# Patient Record
Sex: Female | Born: 1985 | Race: Black or African American | Hispanic: No | Marital: Single | Smoking: Former smoker
Health system: Southern US, Community
[De-identification: ages and names within clinical notes are randomized; demographics above are authoritative.]

## PROBLEM LIST (undated history)

## (undated) ENCOUNTER — Emergency Department (HOSPITAL_COMMUNITY): Admission: EM | Payer: Self-pay | Source: Home / Self Care

## (undated) DIAGNOSIS — R51 Headache: Secondary | ICD-10-CM

## (undated) DIAGNOSIS — I259 Chronic ischemic heart disease, unspecified: Secondary | ICD-10-CM

## (undated) DIAGNOSIS — I10 Essential (primary) hypertension: Secondary | ICD-10-CM

## (undated) DIAGNOSIS — M5126 Other intervertebral disc displacement, lumbar region: Secondary | ICD-10-CM

## (undated) DIAGNOSIS — M5430 Sciatica, unspecified side: Secondary | ICD-10-CM

## (undated) HISTORY — PX: NO PAST SURGERIES: SHX2092

---

## 2009-03-11 ENCOUNTER — Emergency Department (HOSPITAL_COMMUNITY): Admission: EM | Admit: 2009-03-11 | Discharge: 2009-03-11 | Payer: Self-pay | Admitting: Emergency Medicine

## 2009-04-22 ENCOUNTER — Inpatient Hospital Stay (HOSPITAL_COMMUNITY): Admission: AD | Admit: 2009-04-22 | Discharge: 2009-04-22 | Payer: Self-pay | Admitting: Obstetrics & Gynecology

## 2009-05-09 ENCOUNTER — Ambulatory Visit: Payer: Self-pay | Admitting: Obstetrics and Gynecology

## 2009-05-09 LAB — CONVERTED CEMR LAB
AST: 11 units/L (ref 0–37)
Alkaline Phosphatase: 51 units/L (ref 39–117)
BUN: 13 mg/dL (ref 6–23)
Calcium: 8.5 mg/dL (ref 8.4–10.5)
Chloride: 103 meq/L (ref 96–112)
Creatinine, Ser: 0.82 mg/dL (ref 0.40–1.20)
Total Bilirubin: 0.2 mg/dL — ABNORMAL LOW (ref 0.3–1.2)

## 2009-05-14 ENCOUNTER — Ambulatory Visit (HOSPITAL_COMMUNITY): Admission: RE | Admit: 2009-05-14 | Discharge: 2009-05-14 | Payer: Self-pay | Admitting: Obstetrics and Gynecology

## 2009-05-15 ENCOUNTER — Ambulatory Visit: Payer: Self-pay | Admitting: Obstetrics and Gynecology

## 2009-06-22 ENCOUNTER — Emergency Department (HOSPITAL_COMMUNITY): Admission: EM | Admit: 2009-06-22 | Discharge: 2009-06-23 | Payer: Self-pay | Admitting: Emergency Medicine

## 2009-10-29 ENCOUNTER — Emergency Department (HOSPITAL_COMMUNITY): Admission: EM | Admit: 2009-10-29 | Discharge: 2009-10-29 | Payer: Self-pay | Admitting: Emergency Medicine

## 2009-11-12 ENCOUNTER — Inpatient Hospital Stay (HOSPITAL_COMMUNITY): Admission: AD | Admit: 2009-11-12 | Discharge: 2009-11-12 | Payer: Self-pay | Admitting: Obstetrics & Gynecology

## 2010-03-04 ENCOUNTER — Emergency Department (HOSPITAL_COMMUNITY)
Admission: EM | Admit: 2010-03-04 | Discharge: 2010-03-04 | Payer: Self-pay | Source: Home / Self Care | Admitting: Emergency Medicine

## 2010-04-17 LAB — URINALYSIS, ROUTINE W REFLEX MICROSCOPIC
Bilirubin Urine: NEGATIVE
Glucose, UA: NEGATIVE mg/dL
Glucose, UA: NEGATIVE mg/dL
Hgb urine dipstick: NEGATIVE
Ketones, ur: NEGATIVE mg/dL
Nitrite: NEGATIVE
Protein, ur: NEGATIVE mg/dL
Protein, ur: NEGATIVE mg/dL
Specific Gravity, Urine: 1.02 (ref 1.005–1.030)
Urobilinogen, UA: 1 mg/dL (ref 0.0–1.0)
pH: 7.5 (ref 5.0–8.0)

## 2010-04-17 LAB — GC/CHLAMYDIA PROBE AMP, GENITAL
Chlamydia, DNA Probe: NEGATIVE
Chlamydia, DNA Probe: NEGATIVE
GC Probe Amp, Genital: NEGATIVE
GC Probe Amp, Genital: NEGATIVE

## 2010-04-17 LAB — POCT I-STAT, CHEM 8
BUN: 10 mg/dL (ref 6–23)
Calcium, Ion: 1.11 mmol/L — ABNORMAL LOW (ref 1.12–1.32)
Glucose, Bld: 93 mg/dL (ref 70–99)
HCT: 41 % (ref 36.0–46.0)
TCO2: 26 mmol/L (ref 0–100)

## 2010-04-17 LAB — POCT PREGNANCY, URINE
Preg Test, Ur: NEGATIVE
Preg Test, Ur: NEGATIVE

## 2010-04-17 LAB — DIFFERENTIAL
Basophils Absolute: 0.1 10*3/uL (ref 0.0–0.1)
Basophils Relative: 2 % — ABNORMAL HIGH (ref 0–1)
Monocytes Relative: 5 % (ref 3–12)
Neutro Abs: 3.7 10*3/uL (ref 1.7–7.7)
Neutrophils Relative %: 52 % (ref 43–77)

## 2010-04-17 LAB — WET PREP, GENITAL
Trich, Wet Prep: NONE SEEN
WBC, Wet Prep HPF POC: NONE SEEN
Yeast Wet Prep HPF POC: NONE SEEN
Yeast Wet Prep HPF POC: NONE SEEN

## 2010-04-17 LAB — CBC
Hemoglobin: 12.6 g/dL (ref 12.0–15.0)
MCHC: 33.2 g/dL (ref 30.0–36.0)
RDW: 17.2 % — ABNORMAL HIGH (ref 11.5–15.5)

## 2010-04-21 LAB — GLUCOSE, CAPILLARY: Glucose-Capillary: 143 mg/dL — ABNORMAL HIGH (ref 70–99)

## 2010-04-24 LAB — POCT I-STAT, CHEM 8
BUN: 7 mg/dL (ref 6–23)
Calcium, Ion: 1.12 mmol/L (ref 1.12–1.32)
Chloride: 103 mEq/L (ref 96–112)
Creatinine, Ser: 0.7 mg/dL (ref 0.4–1.2)
Glucose, Bld: 90 mg/dL (ref 70–99)

## 2010-04-24 LAB — URINALYSIS, ROUTINE W REFLEX MICROSCOPIC
Bilirubin Urine: NEGATIVE
Hgb urine dipstick: NEGATIVE
Ketones, ur: NEGATIVE mg/dL
Nitrite: NEGATIVE
pH: 7.5 (ref 5.0–8.0)

## 2010-04-24 LAB — CBC
Hemoglobin: 12.7 g/dL (ref 12.0–15.0)
MCHC: 32.7 g/dL (ref 30.0–36.0)
MCV: 77.1 fL — ABNORMAL LOW (ref 78.0–100.0)
RDW: 18 % — ABNORMAL HIGH (ref 11.5–15.5)

## 2010-04-24 LAB — DIFFERENTIAL
Basophils Absolute: 0.1 10*3/uL (ref 0.0–0.1)
Basophils Relative: 1 % (ref 0–1)
Eosinophils Absolute: 0.2 10*3/uL (ref 0.0–0.7)
Eosinophils Relative: 2 % (ref 0–5)
Monocytes Absolute: 0.8 10*3/uL (ref 0.1–1.0)
Monocytes Relative: 8 % (ref 3–12)
Neutro Abs: 4.5 10*3/uL (ref 1.7–7.7)

## 2010-04-24 LAB — POCT CARDIAC MARKERS: Myoglobin, poc: 47.7 ng/mL (ref 12–200)

## 2010-04-28 LAB — CBC
HCT: 37.4 % (ref 36.0–46.0)
Hemoglobin: 11.8 g/dL — ABNORMAL LOW (ref 12.0–15.0)
MCHC: 31.6 g/dL (ref 30.0–36.0)
RBC: 4.8 MIL/uL (ref 3.87–5.11)

## 2010-04-28 LAB — WET PREP, GENITAL

## 2010-04-28 LAB — URINALYSIS, ROUTINE W REFLEX MICROSCOPIC
Bilirubin Urine: NEGATIVE
Glucose, UA: NEGATIVE mg/dL
Hgb urine dipstick: NEGATIVE
Nitrite: NEGATIVE
Specific Gravity, Urine: 1.025 (ref 1.005–1.030)
pH: 6 (ref 5.0–8.0)

## 2010-04-28 LAB — DIFFERENTIAL
Basophils Relative: 0 % (ref 0–1)
Monocytes Absolute: 0.6 10*3/uL (ref 0.1–1.0)
Monocytes Relative: 7 % (ref 3–12)
Neutro Abs: 5.3 10*3/uL (ref 1.7–7.7)

## 2010-04-28 LAB — GC/CHLAMYDIA PROBE AMP, GENITAL: Chlamydia, DNA Probe: NEGATIVE

## 2010-08-07 ENCOUNTER — Inpatient Hospital Stay (HOSPITAL_COMMUNITY)
Admission: AD | Admit: 2010-08-07 | Discharge: 2010-08-07 | Disposition: A | Discharge status: Home / Self Care | Payer: Self-pay | Source: Ambulatory Visit | Attending: Obstetrics and Gynecology | Admitting: Obstetrics and Gynecology

## 2010-08-07 ENCOUNTER — Inpatient Hospital Stay (HOSPITAL_COMMUNITY): Payer: Self-pay

## 2010-08-07 DIAGNOSIS — B9689 Other specified bacterial agents as the cause of diseases classified elsewhere: Secondary | ICD-10-CM | POA: Insufficient documentation

## 2010-08-07 DIAGNOSIS — N760 Acute vaginitis: Secondary | ICD-10-CM | POA: Insufficient documentation

## 2010-08-07 DIAGNOSIS — A499 Bacterial infection, unspecified: Secondary | ICD-10-CM | POA: Insufficient documentation

## 2010-08-07 DIAGNOSIS — R109 Unspecified abdominal pain: Secondary | ICD-10-CM | POA: Insufficient documentation

## 2010-08-07 LAB — URINALYSIS, ROUTINE W REFLEX MICROSCOPIC
Glucose, UA: NEGATIVE mg/dL
Ketones, ur: 15 mg/dL — AB
Leukocytes, UA: NEGATIVE
Nitrite: NEGATIVE
Protein, ur: NEGATIVE mg/dL
Specific Gravity, Urine: >1.03 — ABNORMAL HIGH (ref 1.005–1.030)
Urobilinogen, UA: 0.2 mg/dL (ref 0.0–1.0)
pH: 6 (ref 5.0–8.0)

## 2010-08-07 LAB — COMPREHENSIVE METABOLIC PANEL
ALT: 14 U/L (ref 0–35)
Albumin: 3.1 g/dL — ABNORMAL LOW (ref 3.5–5.2)
Alkaline Phosphatase: 61 U/L (ref 39–117)
Potassium: 3.8 mEq/L (ref 3.5–5.1)
Sodium: 138 mEq/L (ref 135–145)
Total Protein: 7.3 g/dL (ref 6.0–8.3)

## 2010-08-07 LAB — URINE MICROSCOPIC-ADD ON

## 2010-08-07 LAB — CBC
HCT: 37.6 % (ref 36.0–46.0)
Platelets: 408 10*3/uL — ABNORMAL HIGH (ref 150–400)
RDW: 17.8 % — ABNORMAL HIGH (ref 11.5–15.5)
WBC: 9 10*3/uL (ref 4.0–10.5)

## 2010-08-07 LAB — WET PREP, GENITAL

## 2010-08-08 ENCOUNTER — Emergency Department (HOSPITAL_COMMUNITY)
Admission: EM | Admit: 2010-08-08 | Discharge: 2010-08-08 | Disposition: A | Discharge status: Home / Self Care | Payer: Self-pay | Attending: Emergency Medicine | Admitting: Emergency Medicine

## 2010-08-08 ENCOUNTER — Emergency Department (HOSPITAL_COMMUNITY): Payer: Self-pay

## 2010-08-08 DIAGNOSIS — R112 Nausea with vomiting, unspecified: Secondary | ICD-10-CM | POA: Insufficient documentation

## 2010-08-08 DIAGNOSIS — E869 Volume depletion, unspecified: Secondary | ICD-10-CM | POA: Insufficient documentation

## 2010-08-08 DIAGNOSIS — R079 Chest pain, unspecified: Secondary | ICD-10-CM | POA: Insufficient documentation

## 2010-08-08 DIAGNOSIS — R55 Syncope and collapse: Secondary | ICD-10-CM | POA: Insufficient documentation

## 2010-08-08 DIAGNOSIS — I10 Essential (primary) hypertension: Secondary | ICD-10-CM | POA: Insufficient documentation

## 2010-08-08 DIAGNOSIS — R10816 Epigastric abdominal tenderness: Secondary | ICD-10-CM | POA: Insufficient documentation

## 2010-08-08 LAB — COMPREHENSIVE METABOLIC PANEL WITH GFR
ALT: 15 U/L (ref 0–35)
AST: 14 U/L (ref 0–37)
Albumin: 3.4 g/dL — ABNORMAL LOW (ref 3.5–5.2)
Alkaline Phosphatase: 58 U/L (ref 39–117)
BUN: 12 mg/dL (ref 6–23)
CO2: 27 meq/L (ref 19–32)
Calcium: 8.8 mg/dL (ref 8.4–10.5)
Chloride: 102 meq/L (ref 96–112)
Creatinine, Ser: 0.7 mg/dL (ref 0.50–1.10)
GFR calc Af Amer: >60 mL/min
GFR calc non Af Amer: >60 mL/min
Glucose, Bld: 87 mg/dL (ref 70–99)
Potassium: 4.1 meq/L (ref 3.5–5.1)
Sodium: 136 meq/L (ref 135–145)
Total Bilirubin: 0.1 mg/dL — ABNORMAL LOW (ref 0.3–1.2)
Total Protein: 7.7 g/dL (ref 6.0–8.3)

## 2010-08-08 LAB — CBC
HCT: 37.6 % (ref 36.0–46.0)
Hemoglobin: 12.2 g/dL (ref 12.0–15.0)
MCH: 24.5 pg — ABNORMAL LOW (ref 26.0–34.0)
MCHC: 32.4 g/dL (ref 30.0–36.0)
MCV: 75.5 fL — ABNORMAL LOW (ref 78.0–100.0)
Platelets: 445 10*3/uL — ABNORMAL HIGH (ref 150–400)
RBC: 4.98 MIL/uL (ref 3.87–5.11)
RDW: 17.4 % — ABNORMAL HIGH (ref 11.5–15.5)
WBC: 8.7 10*3/uL (ref 4.0–10.5)

## 2010-08-08 LAB — URINALYSIS, ROUTINE W REFLEX MICROSCOPIC
Bilirubin Urine: NEGATIVE
Glucose, UA: NEGATIVE mg/dL
pH: 6.5 (ref 5.0–8.0)

## 2010-08-08 LAB — URINE MICROSCOPIC-ADD ON

## 2010-08-08 LAB — DIFFERENTIAL
Basophils Absolute: 0 10*3/uL (ref 0.0–0.1)
Basophils Relative: 0 % (ref 0–1)
Eosinophils Absolute: 0.3 10*3/uL (ref 0.0–0.7)
Eosinophils Relative: 4 % (ref 0–5)
Lymphocytes Relative: 45 % (ref 12–46)
Lymphs Abs: 3.9 10*3/uL (ref 0.7–4.0)
Monocytes Absolute: 0.6 10*3/uL (ref 0.1–1.0)
Monocytes Relative: 7 % (ref 3–12)
Neutro Abs: 3.8 10*3/uL (ref 1.7–7.7)
Neutrophils Relative %: 44 % (ref 43–77)

## 2010-10-22 ENCOUNTER — Encounter: Payer: Self-pay | Admitting: Obstetrics and Gynecology

## 2010-12-10 ENCOUNTER — Emergency Department (HOSPITAL_COMMUNITY)
Admission: EM | Admit: 2010-12-10 | Discharge: 2010-12-10 | Disposition: A | Discharge status: Home / Self Care | Payer: Medicaid Other | Attending: Emergency Medicine | Admitting: Emergency Medicine

## 2010-12-10 DIAGNOSIS — R059 Cough, unspecified: Secondary | ICD-10-CM | POA: Insufficient documentation

## 2010-12-10 DIAGNOSIS — R05 Cough: Secondary | ICD-10-CM | POA: Insufficient documentation

## 2010-12-10 DIAGNOSIS — R6883 Chills (without fever): Secondary | ICD-10-CM | POA: Insufficient documentation

## 2010-12-10 DIAGNOSIS — J3489 Other specified disorders of nose and nasal sinuses: Secondary | ICD-10-CM | POA: Insufficient documentation

## 2010-12-10 DIAGNOSIS — J069 Acute upper respiratory infection, unspecified: Secondary | ICD-10-CM | POA: Insufficient documentation

## 2010-12-10 DIAGNOSIS — J4489 Other specified chronic obstructive pulmonary disease: Secondary | ICD-10-CM | POA: Insufficient documentation

## 2010-12-10 DIAGNOSIS — F17200 Nicotine dependence, unspecified, uncomplicated: Secondary | ICD-10-CM | POA: Insufficient documentation

## 2010-12-10 DIAGNOSIS — J449 Chronic obstructive pulmonary disease, unspecified: Secondary | ICD-10-CM | POA: Insufficient documentation

## 2010-12-10 MED ORDER — ALBUTEROL SULFATE HFA 108 (90 BASE) MCG/ACT IN AERS
2.0000 | INHALATION_SPRAY | RESPIRATORY_TRACT | Status: DC | PRN
  Administered 2010-12-10: 2 via RESPIRATORY_TRACT
  Filled 2010-12-10: qty 6.7

## 2010-12-10 NOTE — ED Provider Notes (Signed)
History     CSN: [id number] Arrival date & time: 12/10/2010 11:06 AM   First MD Initiated Contact with Patient 12/10/10 1152      Chief Complaint  Patient presents with  . Cough  . URI    (Consider location/radiation/quality/duration/timing/severity/associated sxs/prior treatment) Patient is a 25 y.o. female presenting with cough and URI. The history is provided by the patient. No language interpreter was used.  Cough This is a recurrent problem. The current episode started more than 1 week ago. The problem occurs constantly. The problem has been gradually worsening. The cough is non-productive. There has been no fever. Associated symptoms include chills, rhinorrhea and wheezing. Pertinent negatives include no chest pain, no ear congestion, no ear pain, no headaches, no sore throat and no shortness of breath. She has tried decongestants for the symptoms. The treatment provided mild relief. She is a smoker. Her past medical history is significant for bronchitis and COPD. Her past medical history does not include pneumonia, emphysema or asthma.  URI The primary symptoms include cough and wheezing. Primary symptoms do not include headaches, ear pain or sore throat.  The patient's medical history is significant for COPD. The patient's medical history does not include asthma.  Symptoms associated with the illness include chills and rhinorrhea.    History reviewed. No pertinent past medical history.  History reviewed. No pertinent past surgical history.  History reviewed. No pertinent family history.  History  Substance Use Topics  . Smoking status: Current Everyday Smoker -- 0.5 packs/day    Types: Cigarettes  . Smokeless tobacco: Not on file  . Alcohol Use: No    OB History    Grav Para Term Preterm Abortions TAB SAB Ect Mult Living                  Review of Systems  Constitutional: Positive for chills.  HENT: Positive for rhinorrhea. Negative for ear pain and sore throat.    Respiratory: Positive for cough and wheezing. Negative for shortness of breath.   Cardiovascular: Negative for chest pain.  Neurological: Negative for headaches.  All other systems reviewed and are negative.    Allergies  Review of patient's allergies indicates no known allergies.  Home Medications   Current Outpatient Rx  Name Route Sig Dispense Refill  . IBUPROFEN 200 MG PO TABS Oral Take 800 mg by mouth 2 (two) times daily as needed. Pain      . NYQUIL PO Oral Take 10 mLs by mouth 3 (three) times daily as needed. For cold symptoms       BP 142/74  Pulse 97  Temp(Src) 98.8 F (37.1 C) (Oral)  Resp 16  SpO2 97%  LMP 11/03/2010  Physical Exam  Constitutional: She is oriented to person, place, and time. She appears well-developed and well-nourished.  Eyes: Pupils are equal, round, and reactive to light.  Cardiovascular: Normal rate.   Pulmonary/Chest: No respiratory distress. She has no wheezes. She has no rales. She exhibits no tenderness.  Abdominal: Soft.  Neurological: She is alert and oriented to person, place, and time.  Skin: Skin is warm and dry.  Psychiatric: She has a normal mood and affect.    ED Course  Procedures (including critical care time)   Labs Reviewed  POCT PREGNANCY, URINE  LAB REPORT - SCANNED   No results found.   1. Upper respiratory infection       MDM    Upper respiratory symptoms times several weeks including runny nose sneezing and  cough. Has been taking ibuprofen and Vicryl was relief. Denies fever shortness of breath or wheezing. No acute distress will treat with Z-Pak and inhaler with followup next week if not better.  Medical screening examination/treatment/procedure(s) were performed by non-physician practitioner and as supervising physician I was immediately available for consultation/collaboration. Osvaldo Human, M.D.    Jethro Bastos, NP 12/11/10 4098  Carleene Cooper III, MD 12/11/10 [phone]

## 2010-12-10 NOTE — ED Notes (Signed)
Pt here for cold symptoms for several weeks, no relief with meds at home. sts now headache and dizzy.

## 2010-12-10 NOTE — ED Notes (Signed)
Pt on phone and asked RN to hold on a minute.

## 2011-01-29 ENCOUNTER — Encounter (HOSPITAL_COMMUNITY): Payer: Self-pay | Admitting: *Deleted

## 2011-01-29 ENCOUNTER — Emergency Department (HOSPITAL_COMMUNITY): Payer: Medicaid Other

## 2011-01-29 ENCOUNTER — Emergency Department (HOSPITAL_COMMUNITY)
Admission: EM | Admit: 2011-01-29 | Discharge: 2011-01-29 | Disposition: A | Discharge status: Home / Self Care | Payer: Medicaid Other | Attending: Emergency Medicine | Admitting: Emergency Medicine

## 2011-01-29 DIAGNOSIS — R21 Rash and other nonspecific skin eruption: Secondary | ICD-10-CM | POA: Insufficient documentation

## 2011-01-29 DIAGNOSIS — K2970 Gastritis, unspecified, without bleeding: Secondary | ICD-10-CM | POA: Insufficient documentation

## 2011-01-29 DIAGNOSIS — F17200 Nicotine dependence, unspecified, uncomplicated: Secondary | ICD-10-CM | POA: Insufficient documentation

## 2011-01-29 DIAGNOSIS — K219 Gastro-esophageal reflux disease without esophagitis: Secondary | ICD-10-CM

## 2011-01-29 DIAGNOSIS — K2990 Gastroduodenitis, unspecified, without bleeding: Secondary | ICD-10-CM | POA: Insufficient documentation

## 2011-01-29 DIAGNOSIS — R079 Chest pain, unspecified: Secondary | ICD-10-CM | POA: Insufficient documentation

## 2011-01-29 DIAGNOSIS — R0602 Shortness of breath: Secondary | ICD-10-CM | POA: Insufficient documentation

## 2011-01-29 LAB — URINALYSIS, ROUTINE W REFLEX MICROSCOPIC
Bilirubin Urine: NEGATIVE
Glucose, UA: NEGATIVE mg/dL
Hgb urine dipstick: NEGATIVE
Ketones, ur: NEGATIVE mg/dL
Nitrite: NEGATIVE
Protein, ur: NEGATIVE mg/dL
Specific Gravity, Urine: 1.024 (ref 1.005–1.030)
Urobilinogen, UA: 0.2 mg/dL (ref 0.0–1.0)
pH: 5.5 (ref 5.0–8.0)

## 2011-01-29 LAB — COMPREHENSIVE METABOLIC PANEL
BUN: 8 mg/dL (ref 6–23)
Calcium: 9.1 mg/dL (ref 8.4–10.5)
GFR calc Af Amer: >90 mL/min (ref >90)
Glucose, Bld: 92 mg/dL (ref 70–99)
Sodium: 135 mEq/L (ref 135–145)
Total Protein: 7.7 g/dL (ref 6.0–8.3)

## 2011-01-29 LAB — CBC
MCH: 24.1 pg — ABNORMAL LOW (ref 26.0–34.0)
MCV: 74.5 fL — ABNORMAL LOW (ref 78.0–100.0)
Platelets: 434 10*3/uL — ABNORMAL HIGH (ref 150–400)
RBC: 4.98 MIL/uL (ref 3.87–5.11)

## 2011-01-29 LAB — DIFFERENTIAL
Eosinophils Absolute: 0.2 10*3/uL (ref 0.0–0.7)
Eosinophils Relative: 2 % (ref 0–5)
Lymphs Abs: 3.5 10*3/uL (ref 0.7–4.0)
Monocytes Relative: 7 % (ref 3–12)

## 2011-01-29 LAB — URINE MICROSCOPIC-ADD ON

## 2011-01-29 LAB — LIPASE, BLOOD: Lipase: 14 U/L (ref 11–59)

## 2011-01-29 LAB — D-DIMER, QUANTITATIVE: D-Dimer, Quant: 0.23 ug{FEU}/mL (ref 0.00–0.48)

## 2011-01-29 MED ORDER — FAMOTIDINE IN NACL 20-0.9 MG/50ML-% IV SOLN
20.0000 mg | Freq: Once | INTRAVENOUS | Status: AC
  Administered 2011-01-29: 20 mg via INTRAVENOUS
  Filled 2011-01-29: qty 50

## 2011-01-29 MED ORDER — KETOROLAC TROMETHAMINE 30 MG/ML IJ SOLN
30.0000 mg | Freq: Once | INTRAMUSCULAR | Status: AC
  Administered 2011-01-29: 30 mg via INTRAVENOUS
  Filled 2011-01-29: qty 1

## 2011-01-29 MED ORDER — SODIUM CHLORIDE 0.9 % IV BOLUS (SEPSIS)
1000.0000 mL | Freq: Once | INTRAVENOUS | Status: AC
  Administered 2011-01-29: 1000 mL via INTRAVENOUS

## 2011-01-29 MED ORDER — FAMOTIDINE 20 MG PO TABS: 20.0000 mg | ORAL_TABLET | Freq: Two times a day (BID) | ORAL | Status: DC

## 2011-01-29 MED ORDER — OMEPRAZOLE 20 MG PO CPDR: 20.0000 mg | DELAYED_RELEASE_CAPSULE | Freq: Every day | ORAL | Status: DC

## 2011-01-29 MED ORDER — GI COCKTAIL ~~LOC~~
30.0000 mL | Freq: Once | ORAL | Status: AC
  Administered 2011-01-29: 30 mL via ORAL
  Filled 2011-01-29: qty 30

## 2011-01-29 MED ORDER — PREDNISONE 20 MG PO TABS: 60.0000 mg | ORAL_TABLET | Freq: Every day | ORAL | Status: AC

## 2011-01-29 MED ORDER — HYDROMORPHONE HCL PF 1 MG/ML IJ SOLN
1.0000 mg | Freq: Once | INTRAMUSCULAR | Status: DC
  Filled 2011-01-29: qty 1

## 2011-01-29 MED ORDER — ONDANSETRON HCL 4 MG/2ML IJ SOLN
4.0000 mg | Freq: Once | INTRAMUSCULAR | Status: AC
  Administered 2011-01-29: 4 mg via INTRAVENOUS
  Filled 2011-01-29: qty 2

## 2011-01-29 NOTE — ED Provider Notes (Signed)
History     CSN: [id number]  Arrival date & time 01/29/11  1223   First MD Initiated Contact with Patient 01/29/11 1300      Chief Complaint  Patient presents with  . Sore Throat  . Nausea  . Emesis  . Back Pain  . Stye    (Consider location/radiation/quality/duration/timing/severity/associated sxs/prior treatment) Patient is a 25 y.o. female presenting with abdominal pain. The history is provided by the patient. No language interpreter was used.  Abdominal Pain The primary symptoms of the illness include abdominal pain, nausea, vomiting and diarrhea. The primary symptoms of the illness do not include fever, shortness of breath, hematochezia, dysuria, vaginal discharge or vaginal bleeding. The current episode started 2 days ago. The onset of the illness was gradual. The problem has been gradually worsening.  The abdominal pain began 2 days ago. The pain came on gradually. The abdominal pain has been gradually worsening since its onset. The abdominal pain is located in the epigastric region. The abdominal pain radiates to the back. The abdominal pain is relieved by nothing. The abdominal pain is exacerbated by certain positions.  Nausea began 2 days ago. The nausea is associated with eating. The nausea is exacerbated by food.  The vomiting began today. Vomiting occurred once. The emesis contains stomach contents.  The patient states that she believes she is currently not pregnant. The patient has not had a change in bowel habit. Additional symptoms associated with the illness include heartburn and back pain. Symptoms associated with the illness do not include chills, anorexia, constipation, urgency, hematuria or frequency.    History reviewed. No pertinent past medical history.  History reviewed. No pertinent past surgical history.  No family history on file.  History  Substance Use Topics  . Smoking status: Current Everyday Smoker -- 0.5 packs/day    Types: Cigarettes  .  Smokeless tobacco: Not on file  . Alcohol Use: No    OB History    Grav Para Term Preterm Abortions TAB SAB Ect Mult Living                  Review of Systems  Constitutional: Negative for fever, chills, activity change and appetite change.  HENT: Positive for sore throat. Negative for congestion, rhinorrhea, neck pain and neck stiffness.   Respiratory: Negative for cough and shortness of breath.   Cardiovascular: Negative for chest pain and palpitations.  Gastrointestinal: Positive for heartburn, nausea, vomiting, abdominal pain and diarrhea. Negative for constipation, hematochezia and anorexia.  Genitourinary: Negative for dysuria, urgency, frequency, hematuria, flank pain, vaginal bleeding and vaginal discharge.  Musculoskeletal: Positive for back pain. Negative for arthralgias.  Neurological: Negative for dizziness, weakness, light-headedness, numbness and headaches.  All other systems reviewed and are negative.    Allergies  Review of patient's allergies indicates no known allergies.  Home Medications   Current Outpatient Rx  Name Route Sig Dispense Refill  . IBUPROFEN 200 MG PO TABS Oral Take 1,200 mg by mouth 2 (two) times daily as needed. Pain     . NYQUIL PO Oral Take 10 mLs by mouth 3 (three) times daily as needed. For cold symptoms    . FAMOTIDINE 20 MG PO TABS Oral Take 1 tablet (20 mg total) by mouth 2 (two) times daily. 30 tablet 0  . OMEPRAZOLE 20 MG PO CPDR Oral Take 1 capsule (20 mg total) by mouth daily. 30 capsule 0  . PREDNISONE 20 MG PO TABS Oral Take 3 tablets (60 mg total) by  mouth daily. 15 tablet 0    BP 135/81  Pulse 101  Temp(Src) 98.3 F (36.8 C) (Oral)  Resp 16  SpO2 100%  LMP 12/08/2010  Physical Exam  Nursing note and vitals reviewed. Constitutional: She is oriented to person, place, and time. She appears well-developed and well-nourished. No distress.  HENT:  Head: Normocephalic and atraumatic.  Mouth/Throat: Oropharynx is clear and  moist. No oropharyngeal exudate.  Eyes: Conjunctivae and EOM are normal. Pupils are equal, round, and reactive to light.  Neck: Normal range of motion. Neck supple.  Cardiovascular: Normal rate, regular rhythm, normal heart sounds and intact distal pulses.  Exam reveals no gallop and no friction rub.   No murmur heard. Pulmonary/Chest: Effort normal and breath sounds normal. No respiratory distress.  Abdominal: Soft. Bowel sounds are normal. There is tenderness (epigastric pain). There is no rebound and no guarding.  Musculoskeletal: Normal range of motion. She exhibits no tenderness.  Neurological: She is alert and oriented to person, place, and time.  Skin: Skin is warm and dry.       Pruritic maculopapular rash on LE bilaterally    ED Course  Procedures (including critical care time)  Labs Reviewed  CBC - Abnormal; Notable for the following:    MCV 74.5 (*)    MCH 24.1 (*)    RDW 17.5 (*)    Platelets 434 (*)    All other components within normal limits  COMPREHENSIVE METABOLIC PANEL - Abnormal; Notable for the following:    Albumin 3.4 (*)    Total Bilirubin 0.2 (*)    All other components within normal limits  URINALYSIS, ROUTINE W REFLEX MICROSCOPIC - Abnormal; Notable for the following:    APPearance CLOUDY (*)    Leukocytes, UA TRACE (*)    All other components within normal limits  URINE MICROSCOPIC-ADD ON - Abnormal; Notable for the following:    Squamous Epithelial / LPF MANY (*)    Bacteria, UA FEW (*)    All other components within normal limits  DIFFERENTIAL  LIPASE, BLOOD  D-DIMER, QUANTITATIVE   Dg Chest 2 View  01/29/2011  *RADIOLOGY REPORT*  Clinical Data: Chest pain.  Shortness of breath.  CHEST - 2 VIEW  Comparison: 08/08/2010  Findings: The heart size is normal.  The lungs are clear.  The visualized soft tissues and bony thorax are unremarkable.  IMPRESSION: Negative chest.  Original Report Authenticated By: Jamesetta Orleans. MATTERN, M.D.     1.  Gastritis   2. GERD (gastroesophageal reflux disease)   3. Rash       MDM  Patient symptoms are secondary to gastritis gastroesophageal reflux disease likely secondary to her obesity and tight clothing. She'll be placed on Pepcid and Prilosec. Instructed to followup with her primary care physician. I also prescribed prednisone for a allergic type rash.  Provided sx for which to return to the emergency department.        Dayton Bailiff, MD 01/29/11 [phone]

## 2011-01-29 NOTE — ED Notes (Signed)
Pt reports sore throat, pain when swallowing, backache, n/v x2 this am,and a sty on L eye. Sts cold symptoms for a few weeks that she has been trying to handle at home, worse since Sunday.

## 2011-01-29 NOTE — ED Notes (Signed)
To X-ray

## 2011-01-29 NOTE — ED Notes (Signed)
Unable to breathe through nose at night, H/a everyday--[place] area.

## 2011-01-29 NOTE — ED Notes (Signed)
Pt also reports diarrhea x 2 days. 

## 2011-01-29 NOTE — ED Notes (Signed)
Returned from xray

## 2011-01-29 NOTE — ED Notes (Signed)
LMP-- Nov 1st-- neg pregnancy test 11/22.  States "too nervous to go to sleep" feels like something is pulling in chest, and hurts to swallow. Mostly on right side of throat.

## 2011-06-25 ENCOUNTER — Inpatient Hospital Stay (HOSPITAL_COMMUNITY)
Admission: AD | Admit: 2011-06-25 | Discharge: 2011-06-25 | Disposition: A | Discharge status: Home / Self Care | Payer: Medicaid Other | Source: Ambulatory Visit | Attending: Obstetrics & Gynecology | Admitting: Obstetrics & Gynecology

## 2011-06-25 ENCOUNTER — Encounter (HOSPITAL_COMMUNITY): Payer: Self-pay | Admitting: *Deleted

## 2011-06-25 DIAGNOSIS — K529 Noninfective gastroenteritis and colitis, unspecified: Secondary | ICD-10-CM

## 2011-06-25 DIAGNOSIS — N949 Unspecified condition associated with female genital organs and menstrual cycle: Secondary | ICD-10-CM | POA: Insufficient documentation

## 2011-06-25 DIAGNOSIS — R109 Unspecified abdominal pain: Secondary | ICD-10-CM | POA: Insufficient documentation

## 2011-06-25 DIAGNOSIS — R1110 Vomiting, unspecified: Secondary | ICD-10-CM | POA: Insufficient documentation

## 2011-06-25 DIAGNOSIS — N926 Irregular menstruation, unspecified: Secondary | ICD-10-CM | POA: Insufficient documentation

## 2011-06-25 DIAGNOSIS — K5289 Other specified noninfective gastroenteritis and colitis: Secondary | ICD-10-CM

## 2011-06-25 DIAGNOSIS — R51 Headache: Secondary | ICD-10-CM | POA: Insufficient documentation

## 2011-06-25 LAB — CBC
HCT: 38.2 % (ref 36.0–46.0)
MCV: 73.5 fL — ABNORMAL LOW (ref 78.0–100.0)
RBC: 5.2 MIL/uL — ABNORMAL HIGH (ref 3.87–5.11)
WBC: 10.2 10*3/uL (ref 4.0–10.5)

## 2011-06-25 LAB — WET PREP, GENITAL

## 2011-06-25 LAB — URINALYSIS, ROUTINE W REFLEX MICROSCOPIC
Glucose, UA: NEGATIVE mg/dL
Ketones, ur: NEGATIVE mg/dL
Protein, ur: 100 mg/dL — AB

## 2011-06-25 LAB — COMPREHENSIVE METABOLIC PANEL
BUN: 10 mg/dL (ref 6–23)
CO2: 27 mEq/L (ref 19–32)
Chloride: 100 mEq/L (ref 96–112)
Creatinine, Ser: 0.63 mg/dL (ref 0.50–1.10)
GFR calc Af Amer: >90 mL/min (ref >90)
GFR calc non Af Amer: >90 mL/min (ref >90)
Glucose, Bld: 113 mg/dL — ABNORMAL HIGH (ref 70–99)
Total Bilirubin: 0.3 mg/dL (ref 0.3–1.2)

## 2011-06-25 LAB — POCT PREGNANCY, URINE: Preg Test, Ur: NEGATIVE

## 2011-06-25 MED ORDER — BUTALBITAL-APAP-CAFFEINE 50-325-40 MG PO TABS: 1.0000 | ORAL_TABLET | Freq: Four times a day (QID) | ORAL | Status: DC | PRN

## 2011-06-25 MED ORDER — BUTALBITAL-APAP-CAFFEINE 50-325-40 MG PO TABS
2.0000 | ORAL_TABLET | Freq: Once | ORAL | Status: AC
  Administered 2011-06-25: 2 via ORAL
  Filled 2011-06-25: qty 2

## 2011-06-25 MED ORDER — PROMETHAZINE HCL 25 MG PO TABS
25.0000 mg | ORAL_TABLET | Freq: Once | ORAL | Status: AC
  Administered 2011-06-25: 25 mg via ORAL
  Filled 2011-06-25: qty 1

## 2011-06-25 MED ORDER — PROMETHAZINE HCL 25 MG PO TABS: 25.0000 mg | ORAL_TABLET | Freq: Four times a day (QID) | ORAL | Status: DC | PRN

## 2011-06-25 NOTE — MAU Provider Note (Signed)
History     CSN: [id number]  Arrival date and time: 06/25/11 1333   First Provider Initiated Contact with Patient 06/25/11 1429      Chief Complaint  Patient presents with  . Pelvic Pain  . Vaginal Bleeding  . Emesis  . Headache   HPI This is a 26 y.o. female who presents via EMS with c/o pain, vomiting and headache since last night. Has had episodes like this before. No fever or diarrhea. Period is one week early . States periods have been irregular for a long time. Has been told in past she has diabetes, but is not convinced this is correct. Did have CBG machine but states all sugars are normal. No meds for this. Has never been pregnant despite years of unprotected sex.   RN NOTE: EMS arrival). Pelvic pain, abd pain, headache, vomiting- all started last night. Took a shower and went to bed- worse today. Feels like something is about to fall out of vagina  OB History    Grav Para Term Preterm Abortions TAB SAB Ect Mult Living   0               Past Medical History  Diagnosis Date  . Diabetes mellitus     Dx in [place] at [facility] but not on meds    Past Surgical History  Procedure Date  . No past surgeries     No family history on file.  History  Substance Use Topics  . Smoking status: Current Everyday Smoker -- 0.2 packs/day    Types: Cigarettes  . Smokeless tobacco: Not on file  . Alcohol Use: No    Allergies: No Known Allergies  Prescriptions prior to admission  Medication Sig Dispense Refill  . Pseudoeph-Doxylamine-DM-APAP (NYQUIL PO) Take 10 mLs by mouth 3 (three) times daily as needed. For cold symptoms        ROS As listed in HPI  Physical Exam   Blood pressure 108/68, pulse 84, temperature 98.4 F (36.9 C), temperature source Oral, resp. rate 22, height 5\' 6"  (1.676 m), weight 348 lb (157.852 kg), last menstrual period 06/07/2011, SpO2 98.00%.  Physical Exam  Constitutional: She is oriented to person, place, and time. She appears well-developed and  well-nourished. No distress.  Cardiovascular: Normal rate.   Respiratory: Effort normal.  Genitourinary: Uterus normal. Vaginal discharge (small blood) found.       Uterus small, nontender Adnexae nontender  Musculoskeletal: Normal range of motion.  Neurological: She is alert and oriented to person, place, and time.  Skin: Skin is warm and dry.  Psychiatric: She has a normal mood and affect.   Normocephalic No apparent distress Abdomen soft and diffusely tender with no guarding or rebound  Glucose 113 Hgb A1C 6.9 CBC    Component Value Date/Time   WBC 10.2 06/25/2011 1504   RBC 5.20* 06/25/2011 1504   HGB 12.5 06/25/2011 1504   HCT 38.2 06/25/2011 1504   PLT 428* 06/25/2011 1504   MCV 73.5* 06/25/2011 1504   MCH 24.0* 06/25/2011 1504   MCHC 32.7 06/25/2011 1504   RDW 18.2* 06/25/2011 1504   LYMPHSABS 3.5 01/29/2011 1420   MONOABS 0.6 01/29/2011 1420   EOSABS 0.2 01/29/2011 1420   BASOSABS 0.0 01/29/2011 1420  UPT Neg Wet prep Negative GC/Chl Neg/neg  MAU Course  Procedures  Assessment and Plan  A:  Diffuse abdominal pain with N/V, probable gastroenteritis      Morbid Obesity      Probable Type II Diabetes P:  Supportive care for gastroenteritis      Discussed PCOS  Possibility and Diabetes.  Needs family doctor, referred to [facility]   [facility] 06/25/2011, 4:37 PM

## 2011-06-25 NOTE — Discharge Instructions (Signed)
Polycystic Ovarian Syndrome (it is possible you have this, we do not know for sure) Polycystic ovarian syndrome is a condition with a number of problems. One problem is with the ovaries. The ovaries are organs located in the female pelvis, on each side of the uterus. Usually, during the menstrual cycle, an egg is released from 1 ovary every month. This is called ovulation. When the egg is fertilized, it goes into the womb (uterus), which allows for the growth of a baby. The egg travels from the ovary through the fallopian tube to the uterus. The ovaries also make the hormones estrogen and progesterone. These hormones help the development of a woman's breasts, body shape, and body hair. They also regulate the menstrual cycle and pregnancy. Sometimes, cysts form in the ovaries. A cyst is a fluid-filled sac. On the ovary, different types of cysts can form. The most common type of ovarian cyst is called a functional or ovulation cyst. It is normal, and often forms during the normal menstrual cycle. Each month, a woman's ovaries grow tiny cysts that hold the eggs. When an egg is fully grown, the sac breaks open. This releases the egg. Then, the sac which released the egg from the ovary dissolves. In one type of functional cyst, called a follicle cyst, the sac does not break open to release the egg. It may actually continue to grow. This type of cyst usually disappears within 1 to 3 months.  One type of cyst problem with the ovaries is called Polycystic Ovarian Syndrome (PCOS). In this condition, many follicle cysts form, but do not rupture and produce an egg. This health problem can affect the following:  Menstrual cycle.   Heart.   Obesity.   Cancer of the uterus.   Fertility.   Blood vessels.   Hair growth (face and body) or baldness.   Hormones.   Appearance.   High blood pressure.   Stroke.   Insulin production.   Inflammation of the liver.   Elevated blood cholesterol and  triglycerides.  CAUSES   No one knows the exact cause of PCOS.   Women with PCOS often have a mother or sister with PCOS. There is not yet enough proof to say this is inherited.   Many women with PCOS have a weight problem.   Researchers are looking at the relationship between PCOS and the body's ability to make insulin. Insulin is a hormone that regulates the change of sugar, starches, and other food into energy for the body's use, or for storage. Some women with PCOS make too much insulin. It is possible that the ovaries react by making too many female hormones, called androgens. This can lead to acne, excessive hair growth, weight gain, and ovulation problems.   Too much production of luteinizing hormone (LH) from the pituitary gland in the brain stimulates the ovary to produce too much female hormone (androgen).  SYMPTOMS   Infrequent or no menstrual periods, and/or irregular bleeding.   Inability to get pregnant (infertility), because of not ovulating.   Increased growth of hair on the face, chest, stomach, back, thumbs, thighs, or toes.   Acne, oily skin, or dandruff.   Pelvic pain.   Weight gain or obesity, usually carrying extra weight around the waist.   Type 2 diabetes (this is the diabetes that usually does not need insulin).   High cholesterol.   High blood pressure.   Female-pattern baldness or thinning hair.   Patches of thickened and dark brown or  black skin on the neck, arms, breasts, or thighs.   Skin tags, or tiny excess flaps of skin, in the armpits or neck area.   Sleep apnea (excessive snoring and breathing stops at times while asleep).   Deepening of the voice.   Gestational diabetes when pregnant.   Increased risk of miscarriage with pregnancy.  DIAGNOSIS  There is no single test to diagnose PCOS.   Your caregiver will:   Take a medical history.   Perform a pelvic exam.   Perform an ultrasound.   Check your female and female hormone levels.    Measure glucose or sugar levels in the blood.   Do other blood tests.   If you are producing too many female hormones, your caregiver will make sure it is from PCOS. At the physical exam, your caregiver will want to evaluate the areas of increased hair growth. Try to allow natural hair growth for a few days before the visit.   During a pelvic exam, the ovaries may be enlarged or swollen by the increased number of small cysts. This can be seen more easily by vaginal ultrasound or screening, to examine the ovaries and lining of the uterus (endometrium) for cysts. The uterine lining may become thicker, if there has not been a regular period.  TREATMENT  Because there is no cure for PCOS, it needs to be managed to prevent problems. Treatments are based on your symptoms. Treatment is also based on whether you want to have a baby or whether you need contraception.  Treatment may include:  Progesterone hormone, to start a menstrual period.   Birth control pills, to make you have regular menstrual periods.   Medicines to make you ovulate, if you want to get pregnant.   Medicines to control your insulin.   Medicine to control your blood pressure.   Medicine and diet, to control your high cholesterol and triglycerides in your blood.   Surgery, making small holes in the ovary, to decrease the amount of female hormone production. This is done through a long, lighted tube (laparoscope), placed into the pelvis through a tiny incision in the lower abdomen.  Your caregiver will go over some of the choices with you. WOMEN WITH PCOS HAVE THESE CHARACTERISTICS:  High levels of female hormones called androgens.   An irregular or no menstrual cycle.   May have many small cysts in their ovaries.  PCOS is the most common hormonal reproductive problem in women of childbearing age. WHY DO WOMEN WITH PCOS HAVE TROUBLE WITH THEIR MENSTRUAL CYCLE? Each month, about 20 eggs start to mature in the ovaries. As one  egg grows and matures, the follicle breaks open to release the egg, so it can travel through the fallopian tube for fertilization. When the single egg leaves the follicle, ovulation takes place. In women with PCOS, the ovary does not make all of the hormones it needs for any of the eggs to fully mature. They may start to grow and accumulate fluid, but no one egg becomes large enough. Instead, some may remain as cysts. Since no egg matures or is released, ovulation does not occur and the hormone progesterone is not made. Without progesterone, a woman's menstrual cycle is irregular or absent. Also, the cysts produce female hormones, which continue to prevent ovulation.  Document Released: 05/15/2004 Document Revised: 01/08/2011 Document Reviewed: 12/07/2008 [facility] Patient Information 2012 [place], [place].Viral Gastroenteritis Viral gastroenteritis is also known as stomach flu. This condition affects the stomach and intestinal tract. It  can cause sudden diarrhea and vomiting. The illness typically lasts 3 to 8 days. Most people develop an immune response that eventually gets rid of the virus. While this natural response develops, the virus can make you quite ill. CAUSES  Many different viruses can cause gastroenteritis, such as rotavirus or noroviruses. You can catch one of these viruses by consuming contaminated food or water. You may also catch a virus by sharing utensils or other personal items with an infected person or by touching a contaminated surface. SYMPTOMS  The most common symptoms are diarrhea and vomiting. These problems can cause a severe loss of body fluids (dehydration) and a body salt (electrolyte) imbalance. Other symptoms may include:  Fever.   Headache.   Fatigue.   Abdominal pain.  DIAGNOSIS  Your caregiver can usually diagnose viral gastroenteritis based on your symptoms and a physical exam. A stool sample may also be taken to test for the presence of viruses or other  infections. TREATMENT  This illness typically goes away on its own. Treatments are aimed at rehydration. The most serious cases of viral gastroenteritis involve vomiting so severely that you are not able to keep fluids down. In these cases, fluids must be given through an intravenous line (IV). HOME CARE INSTRUCTIONS   Drink enough fluids to keep your urine clear or pale yellow. Drink small amounts of fluids frequently and increase the amounts as tolerated.   Ask your caregiver for specific rehydration instructions.   Avoid:   Foods high in sugar.   Alcohol.   Carbonated drinks.   Tobacco.   Juice.   Caffeine drinks.   Extremely hot or cold fluids.   Fatty, greasy foods.   Too much intake of anything at one time.   Dairy products until 24 to 48 hours after diarrhea stops.   You may consume probiotics. Probiotics are active cultures of beneficial bacteria. They may lessen the amount and number of diarrheal stools in adults. Probiotics can be found in yogurt with active cultures and in supplements.   Wash your hands well to avoid spreading the virus.   Only take over-the-counter or prescription medicines for pain, discomfort, or fever as directed by your caregiver. Do not give aspirin to children. Antidiarrheal medicines are not recommended.   Ask your caregiver if you should continue to take your regular prescribed and over-the-counter medicines.   Keep all follow-up appointments as directed by your caregiver.  SEEK IMMEDIATE MEDICAL CARE IF:   You are unable to keep fluids down.   You do not urinate at least once every 6 to 8 hours.   You develop shortness of breath.   You notice blood in your stool or vomit. This may look like coffee grounds.   You have abdominal pain that increases or is concentrated in one small area (localized).   You have persistent vomiting or diarrhea.   You have a fever.   The patient is a child younger than 3 months, and he or she has  a fever.   The patient is a child older than 3 months, and he or she has a fever and persistent symptoms.   The patient is a child older than 3 months, and he or she has a fever and symptoms suddenly get worse.   The patient is a baby, and he or she has no tears when crying.  MAKE SURE YOU:   Understand these instructions.   Will watch your condition.   Will get help right away if  you are not doing well or get worse.  Document Released: 01/19/2005 Document Revised: 01/08/2011 Document Reviewed: 11/05/2010 [facility] Patient Information 2012 [place], [place].

## 2011-06-25 NOTE — MAU Note (Signed)
(  EMS arrival).  Pelvic pain, abd pain, headache, vomiting- all started last night.  Took a shower and went to bed- worse today.  Feels like something is about to fall out of vagina.

## 2011-06-27 LAB — GC/CHLAMYDIA PROBE AMP, GENITAL
Chlamydia, DNA Probe: NEGATIVE
GC Probe Amp, Genital: NEGATIVE

## 2011-07-01 NOTE — MAU Provider Note (Signed)
Medical Screening exam and patient care preformed by advanced practice provider.  Agree with the above management.  

## 2011-07-21 ENCOUNTER — Ambulatory Visit: Payer: Medicaid Other | Admitting: Family Medicine

## 2011-08-12 ENCOUNTER — Ambulatory Visit (INDEPENDENT_AMBULATORY_CARE_PROVIDER_SITE_OTHER): Payer: Medicaid Other | Admitting: Family Medicine

## 2011-08-12 ENCOUNTER — Encounter: Payer: Self-pay | Admitting: Family Medicine

## 2011-08-12 VITALS — BP 133/82 | HR 98 | Temp 98.2°F | Ht 67.0 in | Wt 349.0 lb

## 2011-08-12 DIAGNOSIS — R1013 Epigastric pain: Secondary | ICD-10-CM

## 2011-08-12 DIAGNOSIS — E669 Obesity, unspecified: Secondary | ICD-10-CM

## 2011-08-12 DIAGNOSIS — Z87448 Personal history of other diseases of urinary system: Secondary | ICD-10-CM

## 2011-08-12 DIAGNOSIS — N926 Irregular menstruation, unspecified: Secondary | ICD-10-CM

## 2011-08-12 DIAGNOSIS — N979 Female infertility, unspecified: Secondary | ICD-10-CM

## 2011-08-12 DIAGNOSIS — N912 Amenorrhea, unspecified: Secondary | ICD-10-CM

## 2011-08-12 DIAGNOSIS — E119 Type 2 diabetes mellitus without complications: Secondary | ICD-10-CM

## 2011-08-12 LAB — POCT URINALYSIS DIPSTICK
Bilirubin, UA: NEGATIVE
Glucose, UA: NEGATIVE
Nitrite, UA: NEGATIVE
pH, UA: 5.5

## 2011-08-12 LAB — POCT URINE PREGNANCY: Preg Test, Ur: NEGATIVE

## 2011-08-12 MED ORDER — METFORMIN HCL 500 MG PO TABS: 500.0000 mg | ORAL_TABLET | Freq: Two times a day (BID) | ORAL | Status: DC

## 2011-08-12 NOTE — Assessment & Plan Note (Signed)
Likely secondary to obesity.   FU after Metformin use and attempts at weight loss.

## 2011-08-12 NOTE — Progress Notes (Signed)
Patient ID: Lisa Cooley, female   DOB: [birth year], 26 y.o.   MRN: [id number] Lisa Cooley is a 26 y.o. female who presents to [facility] today for new patient appt and establish care:  1.  Desires pregnancy:  Patient states that she has been trying to get pregnant since age 35.  Has unprotected sexual intercourse without results. Has never had any workup regarding this.  Reports no history of STDs.  Monogamous with 1 partner currently. Has never been pregnant, no miscarriages or abortions.    2.  Hyperglycemia:  Seen at MAU due to abdominal pain in May.  Noted to have hyperglycemia at that time.  Strong family history of diabetes.  Never started on any medication in past herself.  No polyuria, polydipsia.  A1C at that time was 6.9.  Did not have discussion about this with MAU providers she states.  3.  Abdominal pain:  Describes epigastric burning after meals.  Sometimes nausea.  No vomiting.  Has not taken any OTC anti-reflux medications.  No chest pain or dyspnea.  No changes in bowel habits.    The following portions of the patient's history were reviewed and updated as appropriate: allergies, current medications, past medical history, family and social history, and problem list.  Patient is a nonsmoker.   ROS as above otherwise neg. No Chest pain, palpitations, SOB, Fever, Chills, Abd pain, N/V/D.  Medications reviewed. Current Outpatient Prescriptions  Medication Sig Dispense Refill  . butalbital-acetaminophen-caffeine (FIORICET) 50-325-40 MG per tablet Take 1-2 tablets by mouth every 6 (six) hours as needed for headache.  20 tablet  0  . metFORMIN (GLUCOPHAGE) 500 MG tablet Take 1 tablet (500 mg total) by mouth 2 (two) times daily with a meal.  60 tablet  3  . promethazine (PHENERGAN) 25 MG tablet Take 1 tablet (25 mg total) by mouth every 6 (six) hours as needed for nausea.  30 tablet  0  . Pseudoeph-Doxylamine-DM-APAP (NYQUIL PO) Take 10 mLs by mouth 3 (three) times daily as needed. For cold  symptoms        Exam:  BP 133/82  Pulse 98  Temp 98.2 F (36.8 C) (Oral)  Ht 5\' 7"  (1.702 m)  Wt 349 lb (158.305 kg)  BMI 54.66 kg/m2  LMP 07/29/2011 Gen: Well NAD HEENT: EOMI,  MMM Lungs: CTABL Nl WOB Heart: RRR no MRG Abd: Obese, soft.  Nontender throughout abdomen Exts: Non edematous BL  LE, warm and well perfused.   Results for orders placed in visit on 08/12/11 (from the past 72 hour(s))  POCT URINE PREGNANCY     Status: Normal   Collection Time   08/12/11 11:48 AM      Component Value Range Comment   Preg Test, Ur Negative     POCT URINALYSIS DIPSTICK     Status: Abnormal   Collection Time   08/12/11 11:48 AM      Component Value Range Comment   Color, UA yellow      Clarity, UA clear      Glucose, UA negative      Bilirubin, UA negative      Ketones, UA negative      Spec Grav, UA 1.025      Blood, UA negative      pH, UA 5.5      Protein, UA negative      Urobilinogen, UA 0.2      Nitrite, UA negative      Leukocytes, UA Trace

## 2011-08-12 NOTE — Assessment & Plan Note (Signed)
Plan to treat diabetes first.  FU in 3 months for A1C check, will wait for about 6 months before referral to GYN.   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   Possibly PCOS, though [place] in July 2012 did not reveal any cysts.

## 2011-08-12 NOTE — Assessment & Plan Note (Signed)
Diagnosed patient with diabetes. Started Metformin today. Discussed how this could be contributing to infertility.   FU in 3 months.  Referral to diabetic teaching and healthcoach here.

## 2011-08-12 NOTE — Patient Instructions (Addendum)
Take the Metformin 1 pill a day for 3 days, then twice daily after that.   Take it with food. We will get you set up with the diabetic coordinator as well.  Come back and see me in 3 months.    I think if we can get the blood sugars and weight under control, it will help you get pregnant.     Diabetes, Type 2 Diabetes is a long-lasting (chronic) disease. In type 2 diabetes, the pancreas does not make enough insulin (a hormone), and the body does not respond normally to the insulin that is made. This type of diabetes was also previously called adult-onset diabetes. It usually occurs after the age of 67, but it can occur at any age.  CAUSES  Type 2 diabetes happens because the pancreasis not making enough insulin or your body has trouble using the insulin that your pancreas does make properly. SYMPTOMS   Drinking more than usual.   Urinating more than usual.   Blurred vision.   Dry, itchy skin.   Frequent infections.   Feeling more tired than usual (fatigue).  DIAGNOSIS The diagnosis of type 2 diabetes is usually made by one of the following tests:  Fasting blood glucose test. You will not eat for at least 8 hours and then take a blood test.   Random blood glucose test. Your blood glucose (sugar) is checked at any time of the day regardless of when you ate.   Oral glucose tolerance test (OGTT). Your blood glucose is measured after you have not eaten (fasted) and then after you drink a glucose containing beverage.  TREATMENT   Healthy eating.   Exercise.   Medicine, if needed.   Monitoring blood glucose.   Seeing your caregiver regularly.  HOME CARE INSTRUCTIONS   Check your blood glucose at least once a day. More frequent monitoring may be necessary, depending on your medicines and on how well your diabetes is controlled. Your caregiver will advise you.   Take your medicine as directed by your caregiver.   Do not smoke.   Make wise food choices. Ask your caregiver  for information. Weight loss can improve your diabetes.   Learn about low blood glucose (hypoglycemia) and how to treat it.   Get your eyes checked regularly.   Have a yearly physical exam. Have your blood pressure checked and your blood and urine tested.   Wear a pendant or bracelet saying that you have diabetes.   Check your feet every night for cuts, sores, blisters, and redness. Let your caregiver know if you have any problems.  SEEK MEDICAL CARE IF:   You have problems keeping your blood glucose in target range.   You have problems with your medicines.   You have symptoms of an illness that do not improve after 24 hours.   You have a sore or wound that is not healing.   You notice a change in vision or a new problem with your vision.   You have a fever.  MAKE SURE YOU:  Understand these instructions.   Will watch your condition.   Will get help right away if you are not doing well or get worse.  Document Released: 01/19/2005 Document Revised: 01/08/2011 Document Reviewed: 07/07/2010 [facility] Patient Information 2012 [place], [place].

## 2011-08-12 NOTE — Assessment & Plan Note (Signed)
Referral to health coach and diabetes coordinator.

## 2011-08-13 DIAGNOSIS — R1013 Epigastric pain: Secondary | ICD-10-CM | POA: Insufficient documentation

## 2011-08-13 NOTE — Addendum Note (Signed)
Addended byGwendolyn Grant, Rameen Quinney H on: 08/13/2011 09:00 AM   Modules accepted: Orders

## 2011-08-13 NOTE — Assessment & Plan Note (Signed)
Mild. Likely GERD symptoms Patient desires to see if improves with better diet and diabetic medication. If no improvement, will try PPI versus H2 blocker at next appt. FU sooner if any acute worsening

## 2011-08-27 ENCOUNTER — Ambulatory Visit: Payer: Medicaid Other | Admitting: Dietician

## 2011-09-28 ENCOUNTER — Encounter (HOSPITAL_COMMUNITY): Payer: Self-pay | Admitting: *Deleted

## 2011-09-28 ENCOUNTER — Inpatient Hospital Stay (HOSPITAL_COMMUNITY)
Admission: AD | Admit: 2011-09-28 | Discharge: 2011-09-28 | Disposition: A | Discharge status: Home / Self Care | Payer: Medicaid Other | Source: Ambulatory Visit | Attending: Obstetrics & Gynecology | Admitting: Obstetrics & Gynecology

## 2011-09-28 DIAGNOSIS — G43909 Migraine, unspecified, not intractable, without status migrainosus: Secondary | ICD-10-CM | POA: Insufficient documentation

## 2011-09-28 DIAGNOSIS — R51 Headache: Secondary | ICD-10-CM

## 2011-09-28 HISTORY — DX: Headache: R51

## 2011-09-28 MED ORDER — BUTALBITAL-APAP-CAFFEINE 50-325-40 MG PO TABS: 1.0000 | ORAL_TABLET | Freq: Four times a day (QID) | ORAL | Status: AC | PRN

## 2011-09-28 NOTE — MAU Note (Signed)
C/o migraine headache but does not want to be seen for the headache; states that she just wants to get a rx for whatever she was prescribed last time for the migraines because " it really works  Good'; desires a pregnancy test also; does not wish to be examined;

## 2011-09-28 NOTE — MAU Note (Signed)
Not in lobby

## 2011-09-28 NOTE — MAU Provider Note (Signed)
History     CSN: [id number]  Arrival date and time: 09/28/11 1026   First Provider Initiated Contact with Patient 09/28/11 1144      Chief Complaint  Patient presents with  . hx of abd pain, wants to know if preg    HPI 26 y.o. G0P0 presents to MAU for pregnancy test.  Patient's last menstrual period was 08/22/2011.  She has had abdom cramping this week but denies today.  She reports a migraine today and has taken Fioricet for this in the past which works well.  She is not light-sensitive with her h/a and has had no visual disturbances.  She also has a cough x1 week which she thinks may be allergies.  She denies vaginal bleeding, vaginal itching/burning, urinary symptoms, dizziness, n/v, or fever/chills.      Past Medical History  Diagnosis Date  . Diabetes mellitus     Dx in [place] but not on meds  . Headache     Past Surgical History  Procedure Date  . No past surgeries     Family History  Problem Relation Age of Onset  . Other Neg Hx     History  Substance Use Topics  . Smoking status: Current Everyday Smoker -- 0.2 packs/day    Types: Cigarettes  . Smokeless tobacco: Not on file  . Alcohol Use: No    Allergies: No Known Allergies  Prescriptions prior to admission  Medication Sig Dispense Refill  . metFORMIN (GLUCOPHAGE) 500 MG tablet Take 1 tablet (500 mg total) by mouth 2 (two) times daily with a meal.  60 tablet  3  . promethazine (PHENERGAN) 25 MG tablet Take 25 mg by mouth every 6 (six) hours as needed. For nausea      . Pseudoeph-Doxylamine-DM-APAP (NYQUIL PO) Take 10 mLs by mouth 3 (three) times daily as needed. For cold symptoms      . promethazine (PHENERGAN) 25 MG tablet Take 1 tablet (25 mg total) by mouth every 6 (six) hours as needed for nausea.  30 tablet  0    Review of Systems  Constitutional: Negative for fever, chills and malaise/fatigue.  Eyes: Negative for blurred vision.  Respiratory: Positive for cough. Negative for shortness of  breath.   Cardiovascular: Negative for chest pain.  Gastrointestinal: Negative for heartburn, nausea, vomiting and abdominal pain.  Genitourinary: Negative for dysuria, urgency and frequency.  Musculoskeletal: Negative.   Neurological: Positive for headaches. Negative for dizziness.  Psychiatric/Behavioral: Negative for depression.   Physical Exam   Blood pressure 117/52, pulse 112, temperature 98.6 F (37 C), temperature source Oral, resp. rate 20, height 5' 6.5" (1.689 m), weight 158.305 kg (349 lb), last menstrual period 08/22/2011.  Physical Exam  Nursing note and vitals reviewed. Constitutional: She is oriented to person, place, and time. She appears well-developed and well-nourished.  Neck: Normal range of motion.  Cardiovascular: Normal rate, regular rhythm and normal heart sounds.   Respiratory: Effort normal and breath sounds normal.  GI: Soft.  Musculoskeletal: Normal range of motion.  Neurological: She is alert and oriented to person, place, and time.  Skin: Skin is warm and dry.  Psychiatric: She has a normal mood and affect. Her behavior is normal. Judgment and thought content normal.    MAU Course  Procedures   Assessment and Plan  Headache Neg UPT  D/C home Fioricet, see dosage below Drink plenty of fluids, recommend OTC Mucinex for cough/congestion, try OTC daily allergy medication F/U with family practice for regular physicals, ongoing  h/a Return to MAU as needed  LEFTWICH-KIRBY, Ted Goodner 09/28/2011, 11:50 AM   Medication List  As of 10/08/2011  6:28 AM   STOP taking these medications         NYQUIL PO         TAKE these medications         butalbital-acetaminophen-caffeine 50-325-40 MG per tablet   Commonly known as: FIORICET, ESGIC   Take 1-2 tablets by mouth every 6 (six) hours as needed for headache.      metFORMIN 500 MG tablet   Commonly known as: GLUCOPHAGE   Take 1 tablet (500 mg total) by mouth 2 (two) times daily with a meal.       promethazine 25 MG tablet   Commonly known as: PHENERGAN   Take 25 mg by mouth every 6 (six) hours as needed. For nausea

## 2011-09-28 NOTE — MAU Note (Signed)
Not in lobby- was in with her sister in another rm.Cycle came on last month, came on for 2-3 days was really heavy. preg tests are always neg.  Hx of gastritis wants to know if that is what is causing pain.  Would like to get preg.  Went to [facility] mid July.  Explained this to them,  Was given a medication to take for 3 months and then go back. Wants preg test.

## 2011-10-26 ENCOUNTER — Ambulatory Visit: Payer: Medicaid Other | Admitting: Family Medicine

## 2011-11-05 ENCOUNTER — Encounter (HOSPITAL_COMMUNITY): Payer: Self-pay | Admitting: *Deleted

## 2011-11-05 ENCOUNTER — Emergency Department (HOSPITAL_COMMUNITY): Payer: Self-pay

## 2011-11-05 ENCOUNTER — Other Ambulatory Visit: Payer: Self-pay

## 2011-11-05 ENCOUNTER — Emergency Department (HOSPITAL_COMMUNITY)
Admission: EM | Admit: 2011-11-05 | Discharge: 2011-11-06 | Disposition: A | Discharge status: Home / Self Care | Payer: Self-pay | Attending: Emergency Medicine | Admitting: Emergency Medicine

## 2011-11-05 DIAGNOSIS — R079 Chest pain, unspecified: Secondary | ICD-10-CM | POA: Insufficient documentation

## 2011-11-05 DIAGNOSIS — Z202 Contact with and (suspected) exposure to infections with a predominantly sexual mode of transmission: Secondary | ICD-10-CM | POA: Insufficient documentation

## 2011-11-05 DIAGNOSIS — X58XXXA Exposure to other specified factors, initial encounter: Secondary | ICD-10-CM | POA: Insufficient documentation

## 2011-11-05 DIAGNOSIS — L218 Other seborrheic dermatitis: Secondary | ICD-10-CM | POA: Insufficient documentation

## 2011-11-05 DIAGNOSIS — L298 Other pruritus: Secondary | ICD-10-CM | POA: Insufficient documentation

## 2011-11-05 DIAGNOSIS — L2989 Other pruritus: Secondary | ICD-10-CM | POA: Insufficient documentation

## 2011-11-05 DIAGNOSIS — E119 Type 2 diabetes mellitus without complications: Secondary | ICD-10-CM | POA: Insufficient documentation

## 2011-11-05 DIAGNOSIS — IMO0002 Reserved for concepts with insufficient information to code with codable children: Secondary | ICD-10-CM | POA: Insufficient documentation

## 2011-11-05 DIAGNOSIS — Z79899 Other long term (current) drug therapy: Secondary | ICD-10-CM | POA: Insufficient documentation

## 2011-11-05 DIAGNOSIS — L219 Seborrheic dermatitis, unspecified: Secondary | ICD-10-CM

## 2011-11-05 DIAGNOSIS — S29011A Strain of muscle and tendon of front wall of thorax, initial encounter: Secondary | ICD-10-CM

## 2011-11-05 MED ORDER — PENICILLIN G BENZATHINE 1200000 UNIT/2ML IM SUSP
2.4000 10*6.[IU] | Freq: Once | INTRAMUSCULAR | Status: AC
  Administered 2011-11-05: 2.4 10*6.[IU] via INTRAMUSCULAR
  Filled 2011-11-05 (×2): qty 4

## 2011-11-05 MED ORDER — OXYCODONE-ACETAMINOPHEN 5-325 MG PO TABS
2.0000 | ORAL_TABLET | ORAL | Status: DC | PRN
Start: 2011-11-05 — End: 2012-12-18

## 2011-11-05 MED ORDER — OXYCODONE-ACETAMINOPHEN 5-325 MG PO TABS
2.0000 | ORAL_TABLET | Freq: Once | ORAL | Status: AC
  Administered 2011-11-05: 2 via ORAL
  Filled 2011-11-05: qty 2

## 2011-11-05 NOTE — ED Provider Notes (Signed)
History     CSN: [id number]  Arrival date & time 11/05/11  2101   First MD Initiated Contact with Patient 11/05/11 2208      Chief Complaint  Patient presents with  . Chest Pain    (Consider location/radiation/quality/duration/timing/severity/associated sxs/prior treatment) HPI Comments: Patient with a history of diabetes presents with chest pain that started last night. She describes the pain as soreness located in the middle of her chest. The pain is intermittent as she notices the pain when she lifts something heavy. She does not experience the pain at rest. She did not take anything for pain. She denies radiation of the pain and associated shortness of breath. She denies fever, SOB, cough, headache, abdominal pain, NVD.   Patient complains of scaly, flaky scalp skin after getting her hair braided a few weeks ago. The flaky skin is located around her hairline. She reports associated itching. She reports resolution of the scaly, flakiness, and itching when she washes her hair but she "cannot wash her hair everyday." Nothing makes it worse.   Patient would like to be treated for syphilis because her boyfriend was recently diagnosed and treated for syphilis. She denies abdominal pain, genital sores, dysuria, vaginal discharge.   Patient is a 26 y.o. female presenting with chest pain.  Chest Pain     Past Medical History  Diagnosis Date  . Diabetes mellitus     Dx in [place] but not on meds  . Headache   . Obese     Past Surgical History  Procedure Date  . No past surgeries     Family History  Problem Relation Age of Onset  . Other Neg Hx     History  Substance Use Topics  . Smoking status: Current Every Day Smoker -- 0.2 packs/day    Types: Cigarettes  . Smokeless tobacco: Not on file  . Alcohol Use: No    OB History    Grav Para Term Preterm Abortions TAB SAB Ect Mult Living   0               Review of Systems  Cardiovascular: Positive for chest pain.    Skin: Positive for rash.  All other systems reviewed and are negative.    Allergies  Review of patient's allergies indicates no known allergies.  Home Medications   Current Outpatient Rx  Name Route Sig Dispense Refill  . BUTALBITAL-APAP-CAFFEINE 50-325-40 MG PO TABS Oral Take 1-2 tablets by mouth every 6 (six) hours as needed for headache. 30 tablet 0  . METFORMIN HCL 500 MG PO TABS Oral Take 1 tablet (500 mg total) by mouth 2 (two) times daily with a meal. 60 tablet 3  . PROMETHAZINE HCL 25 MG PO TABS Oral Take 25 mg by mouth every 6 (six) hours as needed. For nausea      BP 121/87  Pulse 111  Temp 97.9 F (36.6 C) (Oral)  Resp 18  SpO2 97%  LMP 09/24/2011  Physical Exam  Nursing note and vitals reviewed. Constitutional: She is oriented to person, place, and time. She appears well-developed and well-nourished. No distress.  HENT:  Head: Normocephalic and atraumatic.  Mouth/Throat: No oropharyngeal exudate.       Dry, flaking skin noted along hairline on left side of scalp. Patient endorses pruritus.   Eyes: Conjunctivae normal and EOM are normal. Pupils are equal, round, and reactive to light. No scleral icterus.  Neck: Normal range of motion. Neck supple.  Cardiovascular: Normal rate and  regular rhythm.  Exam reveals no gallop and no friction rub.   No murmur heard. Pulmonary/Chest: Effort normal and breath sounds normal. She has no wheezes. She has no rales. She exhibits tenderness.       Chest tenderness noted on palpation of central chest. The pain she describes is reproduced with palpation of central chest.   Abdominal: Soft. There is no tenderness.  Musculoskeletal: Normal range of motion.  Neurological: She is alert and oriented to person, place, and time. Coordination normal.       Speech is goal-oriented. Moves limbs without ataxia.   Skin: Skin is warm and dry.  Psychiatric: She has a normal mood and affect. Her behavior is normal.    ED Course  Procedures  (including critical care time)   Labs Reviewed  POCT PREGNANCY, URINE   Dg Chest 2 View  11/05/2011  *RADIOLOGY REPORT*  Clinical Data: Left-sided chest pain.  CHEST - 2 VIEW  Comparison: 01/29/2011  Findings: The lungs are clear without focal infiltrate, edema, pneumothorax or pleural effusion. The cardiopericardial silhouette is within normal limits for size. Imaged bony structures of the thorax are intact. Telemetry leads overlie the chest.  IMPRESSION: Stable.  No acute cardiopulmonary process.   Original Report Authenticated By: ERIC A. MANSELL, M.D.      1. Muscle strain of chest wall   2. Exposure to syphilis   3. Seborrheic dermatitis of scalp       MDM  10:53 PM  Chest pain reproducible with palpation of center of the chest. Chest pain most likely musculoskeletal. Will give her 2 Percocet here. Will treat her syphilis exposure with 2.4 million units of Pen G. Chest xray pending.   11:37 PM Chest xray negative. Patient can be discharged with pain medication for muscle strain and ketoconazole shampoo prescription for scalp dermatitis. No further evaluation needed at this time.       Emilia Beck, PA-C 11/06/11 0021

## 2011-11-05 NOTE — ED Notes (Addendum)
Patient present to ED with c/o intermittent chest pain that started last night.  Patient states that pain is worse when palpated.  Patient is non radiating and located in the middle of her chest.  Patient also states that her boyfriend tested positive for chlamydia and she wants to be tested as well.  Patient denies any vaginal discharge or problems

## 2011-11-05 NOTE — ED Notes (Signed)
Patient states that she came to the ED for many reasons.  C/O mid sternal chest pain that is non-radiating. Pain worsens when pressed or when she lifts something.  Patient also wants to be tested for Syphilis  because her boyfriend tested positive.  Patient also C/O having sores on her scalp. States that they hurt and drain. They are mostly in the left front

## 2011-11-06 NOTE — ED Provider Notes (Signed)
Medical screening examination/treatment/procedure(s) were performed by non-physician practitioner and as supervising physician I was immediately available for consultation/collaboration.   David H Yao, MD 11/06/11 1506 

## 2011-11-13 ENCOUNTER — Ambulatory Visit: Payer: Medicaid Other | Admitting: Family Medicine

## 2011-11-17 ENCOUNTER — Ambulatory Visit: Payer: Medicaid Other | Admitting: Family Medicine

## 2011-11-27 ENCOUNTER — Encounter (HOSPITAL_COMMUNITY): Payer: Self-pay | Admitting: Emergency Medicine

## 2011-11-27 ENCOUNTER — Emergency Department (INDEPENDENT_AMBULATORY_CARE_PROVIDER_SITE_OTHER)
Admission: EM | Admit: 2011-11-27 | Discharge: 2011-11-27 | Disposition: A | Discharge status: Home / Self Care | Payer: Self-pay | Source: Home / Self Care | Attending: Family Medicine | Admitting: Family Medicine

## 2011-11-27 DIAGNOSIS — L218 Other seborrheic dermatitis: Secondary | ICD-10-CM

## 2011-11-27 DIAGNOSIS — L219 Seborrheic dermatitis, unspecified: Secondary | ICD-10-CM

## 2011-11-27 MED ORDER — KETOCONAZOLE 2 % EX SHAM: MEDICATED_SHAMPOO | CUTANEOUS | Status: DC

## 2011-11-27 NOTE — ED Provider Notes (Signed)
History     CSN: [id number]  Arrival date & time 11/27/11  1043   None     Chief Complaint  Patient presents with  . Seborrheic Dermatitis    (Consider location/radiation/quality/duration/timing/severity/associated sxs/prior treatment) Patient is a 26 y.o. female presenting with rash. The history is provided by the patient.  Rash  This is a recurrent problem. The current episode started more than 1 week ago. The problem has not changed since onset.The problem is associated with nothing. There has been no fever. The rash is present on the scalp. The pain is at a severity of 2/10. The pain is mild (burning). The pain has been intermittent since onset. Associated symptoms include itching. Treatments tried: selsum blue. The treatment provided no relief.  states she was dx with seborrhea 3 weeks ago, shampoo provides mild relief.  Denies chemicals to scalp.   Past Medical History  Diagnosis Date  . Diabetes mellitus     Dx in [place] but not on meds  . Headache   . Obese     Past Surgical History  Procedure Date  . No past surgeries     Family History  Problem Relation Age of Onset  . Other Neg Hx     History  Substance Use Topics  . Smoking status: Current Every Day Smoker -- 0.2 packs/day    Types: Cigarettes  . Smokeless tobacco: Not on file  . Alcohol Use: No    OB History    Grav Para Term Preterm Abortions TAB SAB Ect Mult Living   0               Review of Systems  Constitutional: Negative.   HENT: Negative.   Respiratory: Negative.   Cardiovascular: Negative.   Skin: Positive for itching and rash.    Allergies  Review of patient's allergies indicates no known allergies.  Home Medications   Current Outpatient Rx  Name Route Sig Dispense Refill  . ALBUTEROL SULFATE HFA 108 (90 BASE) MCG/ACT IN AERS Inhalation Inhale 2 puffs into the lungs every 6 (six) hours as needed. For shortness of breath    . BUTALBITAL-APAP-CAFFEINE 50-325-40 MG PO TABS Oral  Take 1-2 tablets by mouth every 6 (six) hours as needed for headache. 30 tablet 0  . KETOCONAZOLE 2 % EX SHAM Topical Apply topically 2 (two) times a week. 120 mL 11  . METFORMIN HCL 500 MG PO TABS Oral Take 1 tablet (500 mg total) by mouth 2 (two) times daily with a meal. 60 tablet 3  . OXYCODONE-ACETAMINOPHEN 5-325 MG PO TABS Oral Take 2 tablets by mouth every 4 (four) hours as needed for pain. 6 tablet 0  . PROMETHAZINE HCL 25 MG PO TABS Oral Take 25 mg by mouth every 6 (six) hours as needed. For nausea      BP 150/81  Pulse 100  Temp 98 F (36.7 C) (Oral)  Resp 18  SpO2 99%  LMP 10/15/2011  Physical Exam  Nursing note and vitals reviewed. Constitutional: She is oriented to person, place, and time. Vital signs are normal. She appears well-developed and well-nourished. She is active and cooperative.  HENT:  Head: Normocephalic.       Scalp seborrhea dermatitis  Eyes: Conjunctivae normal are normal. Pupils are equal, round, and reactive to light. No scleral icterus.  Neck: Trachea normal. Neck supple.  Cardiovascular: Normal rate, regular rhythm, normal heart sounds, intact distal pulses and normal pulses.   Pulmonary/Chest: Effort normal and breath sounds normal.  Neurological: She is alert and oriented to person, place, and time. No cranial nerve deficit or sensory deficit.  Skin: Skin is warm and dry.  Psychiatric: She has a normal mood and affect. Her speech is normal and behavior is normal. Judgment and thought content normal. Cognition and memory are normal.    ED Course  Procedures (including critical care time)  Labs Reviewed - No data to display No results found.   1. Seborrheic dermatitis of scalp       MDM  Wash hair with warm water.  Wash first with selsium blue or like product, then with ketoconozole shampoo.  Third wash with conditioning shampoo of your choice.  No gel or heavy pomades.  Blow dry hair on low to medium heat.         Johnsie Kindred,  NP 11/27/11 1147

## 2011-11-27 NOTE — ED Notes (Signed)
Pt c/o constant "damped" hair x2 days... Went to [place] on 11/05/11 and was dx w/Sebborrheic dermatitis... Sx include: burning sensation of scalp, painful, vomiting, nausea ... Has tried OTC dandruff shampoo w/little relief.. Denies: fevers, diarrhea... Pt is alert w/no signs of distress.

## 2011-11-28 NOTE — ED Provider Notes (Signed)
Medical screening examination/treatment/procedure(s) were performed by resident physician or non-physician practitioner and as supervising physician I was immediately available for consultation/collaboration.   Donnamaria Shands DOUGLAS MD.    Marquarius Lofton D Erykah Lippert, MD 11/28/11 1432 

## 2011-11-30 NOTE — Progress Notes (Signed)
Pt has had recent hx of no show/cancellation for appointments.

## 2012-09-15 ENCOUNTER — Telehealth: Payer: Self-pay | Admitting: *Deleted

## 2012-09-15 NOTE — Telephone Encounter (Signed)
Letter sent regarding diabetes follow up care Elizabeth Jude Linck, RN-BSN   

## 2012-12-18 ENCOUNTER — Inpatient Hospital Stay (HOSPITAL_COMMUNITY)
Admission: AD | Admit: 2012-12-18 | Discharge: 2012-12-18 | Disposition: A | Discharge status: Home / Self Care | Payer: Self-pay | Source: Ambulatory Visit | Attending: Obstetrics & Gynecology | Admitting: Obstetrics & Gynecology

## 2012-12-18 ENCOUNTER — Encounter (HOSPITAL_COMMUNITY): Payer: Self-pay | Admitting: *Deleted

## 2012-12-18 DIAGNOSIS — R059 Cough, unspecified: Secondary | ICD-10-CM

## 2012-12-18 DIAGNOSIS — F17200 Nicotine dependence, unspecified, uncomplicated: Secondary | ICD-10-CM | POA: Insufficient documentation

## 2012-12-18 DIAGNOSIS — M546 Pain in thoracic spine: Secondary | ICD-10-CM

## 2012-12-18 DIAGNOSIS — R0789 Other chest pain: Secondary | ICD-10-CM | POA: Insufficient documentation

## 2012-12-18 DIAGNOSIS — R1013 Epigastric pain: Secondary | ICD-10-CM

## 2012-12-18 DIAGNOSIS — M5380 Other specified dorsopathies, site unspecified: Secondary | ICD-10-CM | POA: Insufficient documentation

## 2012-12-18 DIAGNOSIS — R928 Other abnormal and inconclusive findings on diagnostic imaging of breast: Secondary | ICD-10-CM | POA: Insufficient documentation

## 2012-12-18 DIAGNOSIS — N926 Irregular menstruation, unspecified: Secondary | ICD-10-CM | POA: Insufficient documentation

## 2012-12-18 DIAGNOSIS — R110 Nausea: Secondary | ICD-10-CM | POA: Insufficient documentation

## 2012-12-18 DIAGNOSIS — R05 Cough: Secondary | ICD-10-CM

## 2012-12-18 LAB — CBC
MCH: 23.5 pg — ABNORMAL LOW (ref 26.0–34.0)
MCHC: 32.6 g/dL (ref 30.0–36.0)
Platelets: 449 10*3/uL — ABNORMAL HIGH (ref 150–400)
RBC: 4.85 MIL/uL (ref 3.87–5.11)
WBC: 7.7 10*3/uL (ref 4.0–10.5)

## 2012-12-18 LAB — URINALYSIS, ROUTINE W REFLEX MICROSCOPIC
Bilirubin Urine: NEGATIVE
Leukocytes, UA: NEGATIVE
Nitrite: NEGATIVE
Protein, ur: NEGATIVE mg/dL
Specific Gravity, Urine: 1.025 (ref 1.005–1.030)
Urobilinogen, UA: 0.2 mg/dL (ref 0.0–1.0)
pH: 6 (ref 5.0–8.0)

## 2012-12-18 LAB — COMPREHENSIVE METABOLIC PANEL
ALT: 12 U/L (ref 0–35)
AST: 10 U/L (ref 0–37)
Albumin: 3.2 g/dL — ABNORMAL LOW (ref 3.5–5.2)
CO2: 27 mEq/L (ref 19–32)
Chloride: 101 mEq/L (ref 96–112)
GFR calc non Af Amer: >90 mL/min (ref >90)
Potassium: 3.9 mEq/L (ref 3.5–5.1)
Sodium: 136 mEq/L (ref 135–145)
Total Bilirubin: 0.2 mg/dL — ABNORMAL LOW (ref 0.3–1.2)

## 2012-12-18 LAB — POCT PREGNANCY, URINE: Preg Test, Ur: NEGATIVE

## 2012-12-18 MED ORDER — DM-GUAIFENESIN ER 30-600 MG PO TB12
1.0000 | ORAL_TABLET | Freq: Two times a day (BID) | ORAL | Status: DC
  Administered 2012-12-18: 1 via ORAL
  Filled 2012-12-18: qty 1

## 2012-12-18 MED ORDER — CYCLOBENZAPRINE HCL 10 MG PO TABS: 10.0000 mg | ORAL_TABLET | Freq: Two times a day (BID) | ORAL | Status: DC | PRN

## 2012-12-18 MED ORDER — KETOROLAC TROMETHAMINE 60 MG/2ML IM SOLN
60.0000 mg | Freq: Once | INTRAMUSCULAR | Status: AC
  Administered 2012-12-18: 60 mg via INTRAMUSCULAR
  Filled 2012-12-18: qty 2

## 2012-12-18 MED ORDER — DM-GUAIFENESIN ER 30-600 MG PO TB12: 1.0000 | ORAL_TABLET | Freq: Two times a day (BID) | ORAL | Status: DC

## 2012-12-18 MED ORDER — CYCLOBENZAPRINE HCL 10 MG PO TABS
10.0000 mg | ORAL_TABLET | Freq: Once | ORAL | Status: AC
  Administered 2012-12-18: 10 mg via ORAL
  Filled 2012-12-18: qty 1

## 2012-12-18 NOTE — MAU Note (Signed)
Patient presents with complaint of chest pains, muscle spasms, a "lump under my left breast", frequent urination since last night.

## 2012-12-18 NOTE — MAU Note (Signed)
Patient also complains of vomiting since last night.

## 2012-12-18 NOTE — MAU Provider Note (Signed)
History     CSN: [id number]  Arrival date and time: 12/18/12 1029   First Provider Initiated Contact with Patient 12/18/12 1124      Chief Complaint  Patient presents with  . Chest Pain  . Spasms    Muscle  . Breast Mass    under left  . Emesis  . Urinary Frequency   HPI  Pt is not pregnant [id number] with LMP 11/19/2012 and hx of irregular menses.  Pt is massively obese. Pt complains of muscle spasms back and legs for 1 to 2 months.  Pt has taken Advil without relief. Pt also c/o of tight chest and nonproductive cough. Pt has previously had an inhaler.  She smokes 1 cig/day. Pt denies sore throat.  Pt denies vaginal discharge, itching or burning.  Pt has pain with menses and takes Midol. Pt states she has clots with her periods.   Pt has frequency of urination every 10-20 minutes, but no burning or pain. Pt has incontinence when she coughs or sneezes. Pt c/o of small left breast knot which is tender under left breast. Pt does not want to be checked for vaginal infections.  Pt has not had a pap smear recently. Pt has been diagnosed with diabetes, but has not had follow up and is not on meds- has previously seen [facility]. Pt c/o of back spasms over right scapula and right chest- has previously been evaluated for same discomfort with neg cardiac evaluation   RN note: Signed  MAU Note Service date: 12/18/2012 11:01 AM   Patient presents with complaint of chest pains, muscle spasms, a "lump under my left breast", frequent urination since last night.      Past Medical History  Diagnosis Date  . Diabetes mellitus     Dx in [place] but not on meds  . Headache(784.0)   . Obese     Past Surgical History  Procedure Laterality Date  . No past surgeries      Family History  Problem Relation Age of Onset  . Other Neg Hx   . Cancer Father   . Heart disease Sister     History  Substance Use Topics  . Smoking status: Current Every Day Smoker -- 0.25 packs/day    Types:  Cigarettes  . Smokeless tobacco: Never Used  . Alcohol Use: No    Allergies: No Known Allergies  Prescriptions prior to admission  Medication Sig Dispense Refill  . tetrahydrozoline-zinc (VISINE-AC) 0.05-0.25 % ophthalmic solution Place 2 drops into both eyes 3 (three) times daily as needed (for allergies).        Review of Systems  Constitutional: Positive for chills. Negative for fever.  Respiratory: Positive for cough and shortness of breath. Negative for sputum production.        Dry non- productive cough  Gastrointestinal: Positive for heartburn, nausea and vomiting. Negative for abdominal pain, diarrhea and constipation.  Genitourinary: Positive for frequency. Negative for dysuria and urgency.   Physical Exam   Blood pressure 146/83, pulse 100, temperature 97 F (36.1 C), temperature source Oral, resp. rate 22, height 5\' 7"  (1.702 m), weight 163.919 kg (361 lb 6 oz), last menstrual period 11/19/2012.  Physical Exam  Nursing note and vitals reviewed. Constitutional: She is oriented to person, place, and time. She appears well-developed and well-nourished. No distress.  HENT:  Head: Normocephalic.  Eyes: Pupils are equal, round, and reactive to light.  Neck: Normal range of motion. Neck supple.  Cardiovascular: Normal rate and regular rhythm.  Respiratory: Effort normal and breath sounds normal. No respiratory distress. She has no wheezes. She has no rales.  Small ~2cm superficial tender nodule left inner outer breast-7 oclock easily palpable- smooth and mobile  GI: Soft.  Musculoskeletal: Normal range of motion.  Neurological: She is alert and oriented to person, place, and time.  Skin: Skin is warm and dry.  Psychiatric: She has a normal mood and affect.    MAU Course  Procedures Cough- Mucinex DM Nausea/chest discomfort- GI cocktail Back spasms- Toradol and Flexeril Breast nodule- continue SBE- follow up with PCP if increase in size Importance of PCP  stressed   Assessment and Plan  Back spasms- tylenol and flexeril Cough- Mucinex DM Breast nodule- care SBE- f/u if increase in sx Pursue PCP  Alfred Harrel 12/18/2012, 11:25 AM

## 2013-01-16 ENCOUNTER — Encounter (HOSPITAL_COMMUNITY): Payer: Self-pay | Admitting: Emergency Medicine

## 2013-01-16 ENCOUNTER — Emergency Department (HOSPITAL_COMMUNITY)
Admission: EM | Admit: 2013-01-16 | Discharge: 2013-01-16 | Disposition: A | Discharge status: Home / Self Care | Payer: Self-pay | Attending: Emergency Medicine | Admitting: Emergency Medicine

## 2013-01-16 DIAGNOSIS — R739 Hyperglycemia, unspecified: Secondary | ICD-10-CM

## 2013-01-16 DIAGNOSIS — E119 Type 2 diabetes mellitus without complications: Secondary | ICD-10-CM | POA: Insufficient documentation

## 2013-01-16 DIAGNOSIS — H00013 Hordeolum externum right eye, unspecified eyelid: Secondary | ICD-10-CM

## 2013-01-16 DIAGNOSIS — R519 Headache, unspecified: Secondary | ICD-10-CM

## 2013-01-16 DIAGNOSIS — F17200 Nicotine dependence, unspecified, uncomplicated: Secondary | ICD-10-CM | POA: Insufficient documentation

## 2013-01-16 DIAGNOSIS — E669 Obesity, unspecified: Secondary | ICD-10-CM | POA: Insufficient documentation

## 2013-01-16 DIAGNOSIS — H00019 Hordeolum externum unspecified eye, unspecified eyelid: Secondary | ICD-10-CM | POA: Insufficient documentation

## 2013-01-16 DIAGNOSIS — R51 Headache: Secondary | ICD-10-CM | POA: Insufficient documentation

## 2013-01-16 MED ORDER — TETRACAINE HCL 0.5 % OP SOLN
2.0000 [drp] | Freq: Once | OPHTHALMIC | Status: AC
  Administered 2013-01-16: 2 [drp] via OPHTHALMIC
  Filled 2013-01-16: qty 2

## 2013-01-16 MED ORDER — IBUPROFEN 400 MG PO TABS
800.0000 mg | ORAL_TABLET | Freq: Once | ORAL | Status: AC
  Administered 2013-01-16: 800 mg via ORAL
  Filled 2013-01-16: qty 2

## 2013-01-16 MED ORDER — FLUORESCEIN SODIUM 1 MG OP STRP
1.0000 | ORAL_STRIP | Freq: Once | OPHTHALMIC | Status: AC
  Administered 2013-01-16: 1 via OPHTHALMIC
  Filled 2013-01-16: qty 1

## 2013-01-16 NOTE — ED Provider Notes (Signed)
CSN: [id number]     Arrival date & time 01/16/13  1854 History   First MD Initiated Contact with Patient 01/16/13 2109 This chart was scribed for non-physician practitioner Mellody Drown, PA-C working with Nelia Shi, MD by Valera Castle, ED scribe. This patient was seen in room TR04C/TR04C and the patient's care was started at 9:35 PM.      Chief Complaint  Patient presents with  . Stye   The history is provided by the patient. No language interpreter was used.   HPI Comments: Lisa Cooley is a 27 y.o. female who presents to the Emergency Department complaining of a sudden stye, with associated burning pain over her right eye, onset this morning. She states the irritation started yesterday, and the swelling started this morning. She states the irritation and itchiness started in the corner of her eye, and then spread up to the upper part of her eyelid. She denies watery discharge, fever, and any other associated symptoms. Pt has h/o DM and obesity.   10:33 PM - Pt requests her sugar levels be checked. She states her sugar levels have been low recently, but denies having checked her levels at home. She denies being on insulin. She denies knowing if her sugar levels are low due to not eating.   PCP - No PCP Per Patient  Past Medical History  Diagnosis Date  . Diabetes mellitus     Dx in [place] but not on meds  . Headache(784.0)   . Obese    Past Surgical History  Procedure Laterality Date  . No past surgeries     Family History  Problem Relation Age of Onset  . Other Neg Hx   . Cancer Father   . Heart disease Sister    History  Substance Use Topics  . Smoking status: Current Every Day Smoker -- 0.25 packs/day    Types: Cigarettes  . Smokeless tobacco: Never Used  . Alcohol Use: No   OB History   Grav Para Term Preterm Abortions TAB SAB Ect Mult Living   4 1 1  3  3   1      Review of Systems  Constitutional: Negative for fever.  Eyes: Positive for pain  (RIGHT) and itching.  All other systems reviewed and are negative.    Allergies  Review of patient's allergies indicates no known allergies.  Home Medications   Current Outpatient Rx  Name  Route  Sig  Dispense  Refill  . tetrahydrozoline-zinc (VISINE-AC) 0.05-0.25 % ophthalmic solution   Both Eyes   Place 2 drops into both eyes 3 (three) times daily as needed (for allergies).          BP 159/86  Pulse 114  Temp(Src) 98.9 F (37.2 C) (Oral)  Resp 18  Ht 5\' 7"  (1.702 m)  Wt 370 lb 1.6 oz (167.876 kg)  BMI 57.95 kg/m2  SpO2 98%  LMP 12/09/2012  Physical Exam  Nursing note and vitals reviewed. Constitutional: She is oriented to person, place, and time. She appears well-developed and well-nourished. No distress.  HENT:  Head: Normocephalic and atraumatic.  Eyes: EOM are normal. Pupils are equal, round, and reactive to light. Lids are everted and swept, no foreign bodies found. Right eye exhibits hordeolum. Right eye exhibits no discharge. Left eye exhibits no discharge. Right conjunctiva is not injected. Left conjunctiva is not injected.  Slit lamp exam:      The right eye shows no corneal abrasion and no fluorescein uptake.  No surround erythema.   Neck: Normal range of motion. Neck supple.  Cardiovascular: Normal rate.   Pulmonary/Chest: Effort normal and breath sounds normal. No respiratory distress. She has no wheezes. She has no rales.  Abdominal: Soft.  Musculoskeletal: Normal range of motion.  Neurological: She is alert and oriented to person, place, and time.  Skin: Skin is warm and dry.  Psychiatric: She has a normal mood and affect. Her behavior is normal.    ED Course  Procedures (including critical care time)  DIAGNOSTIC STUDIES: Oxygen Saturation is 98% on room air, normal by my interpretation.    COORDINATION OF CARE: 9:36 PM-Discussed treatment plan which includes tetracaine drops with pt at bedside and pt agreed to plan. Advised pt to apply warm  compresses over swollen area as warm and as often as possible. Will give pt referral to ophthalmologist if symptoms worsen.   Labs Review Labs Reviewed - No data to display Imaging Review No results found.  EKG Interpretation   None       MDM   1. Hordeolum, right   2. Headache   3. Hyperglycemia    Pt with Right eye lid swelling, no identifiable corneal abrasion.  Likely hordeolum. Discussed CBG, and treatment plan with the patient. Return precautions given. Reports understanding and no other concerns at this time.  Patient is stable for discharge at this time.  Meds given in ED:  Medications  tetracaine (PONTOCAINE) 0.5 % ophthalmic solution 2 drop (2 drops Right Eye Given 01/16/13 2152)  fluorescein ophthalmic strip 1 strip (1 strip Right Eye Given 01/16/13 2212)  ibuprofen (ADVIL,MOTRIN) tablet 800 mg (800 mg Oral Given 01/16/13 2232)    Discharge Medication List as of 01/16/2013 10:39 PM      I personally performed the services described in this documentation, which was scribed in my presence. The recorded information has been reviewed and is accurate.     Clabe Seal, PA-C 01/19/13 [phone]

## 2013-01-16 NOTE — ED Notes (Signed)
Pt. Reports stye at right eye onset this morning .

## 2013-01-29 NOTE — ED Provider Notes (Signed)
Medical screening examination/treatment/procedure(s) were performed by non-physician practitioner and as supervising physician I was immediately available for consultation/collaboration.   Markavious Micco L Press Casale, MD 01/29/13 2056 

## 2013-12-04 ENCOUNTER — Encounter (HOSPITAL_COMMUNITY): Payer: Self-pay | Admitting: Emergency Medicine

## 2014-01-07 ENCOUNTER — Encounter (HOSPITAL_COMMUNITY): Payer: Self-pay | Admitting: Emergency Medicine

## 2014-01-07 ENCOUNTER — Emergency Department (HOSPITAL_COMMUNITY): Payer: Self-pay

## 2014-01-07 ENCOUNTER — Emergency Department (HOSPITAL_COMMUNITY)
Admission: EM | Admit: 2014-01-07 | Discharge: 2014-01-07 | Disposition: A | Discharge status: Home / Self Care | Payer: Self-pay | Attending: Emergency Medicine | Admitting: Emergency Medicine

## 2014-01-07 DIAGNOSIS — R059 Cough, unspecified: Secondary | ICD-10-CM

## 2014-01-07 DIAGNOSIS — E119 Type 2 diabetes mellitus without complications: Secondary | ICD-10-CM | POA: Insufficient documentation

## 2014-01-07 DIAGNOSIS — B349 Viral infection, unspecified: Secondary | ICD-10-CM | POA: Insufficient documentation

## 2014-01-07 DIAGNOSIS — R000 Tachycardia, unspecified: Secondary | ICD-10-CM | POA: Insufficient documentation

## 2014-01-07 DIAGNOSIS — Z720 Tobacco use: Secondary | ICD-10-CM | POA: Insufficient documentation

## 2014-01-07 DIAGNOSIS — R05 Cough: Secondary | ICD-10-CM | POA: Insufficient documentation

## 2014-01-07 MED ORDER — DM-GUAIFENESIN ER 30-600 MG PO TB12: 1.0000 | ORAL_TABLET | Freq: Two times a day (BID) | ORAL | Status: DC | PRN

## 2014-01-07 MED ORDER — BUTALBITAL-APAP-CAFFEINE 50-325-40 MG PO TABS: 1.0000 | ORAL_TABLET | Freq: Four times a day (QID) | ORAL | Status: DC | PRN

## 2014-01-07 MED ORDER — GUAIFENESIN-CODEINE 100-10 MG/5ML PO SOLN
5.0000 mL | Freq: Once | ORAL | Status: AC
  Administered 2014-01-07: 5 mL via ORAL

## 2014-01-07 MED ORDER — HYDROCOD POLST-CHLORPHEN POLST 10-8 MG/5ML PO LQCR
5.0000 mL | Freq: Once | ORAL | Status: AC
  Administered 2014-01-07: 5 mL via ORAL
  Filled 2014-01-07: qty 5

## 2014-01-07 MED ORDER — GUAIFENESIN-CODEINE 100-10 MG/5ML PO SOLN
10.0000 mL | Freq: Once | ORAL | Status: DC
  Filled 2014-01-07: qty 10

## 2014-01-07 NOTE — ED Notes (Signed)
Pt. Stated, I've had body aches a week ago with a cough and congestion

## 2014-01-07 NOTE — Discharge Instructions (Signed)

## 2014-01-07 NOTE — ED Notes (Signed)
Wheelchair offered to pt for discharged and refused

## 2014-01-07 NOTE — ED Provider Notes (Signed)
CSN: [id number]     Arrival date & time 01/07/14  0930 History   First MD Initiated Contact with Patient 01/07/14 [phone]     Chief Complaint  Patient presents with  . Generalized Body Aches     (Consider location/radiation/quality/duration/timing/severity/associated sxs/prior Treatment) HPI   28 year old obese female with history of non-insulin-dependent diabetes, recurrent headache, presents for evaluations of generalized body aches.  Patient states for the past 2 weeks she has had generalized body aches, runny nose, sneezing, coughing, chest congestion, pleuritic chest pain that has been persistent. She has tried over-the-counter medication to treat her cold but states it provides no relief. She denies any severe headache, neck stiffness, sore throat, shortness of breath, dyspnea or exertion, hemoptysis, abdominal cramping, nausea vomiting and diarrhea, dysuria, or rash. Patient is a smoker. She denies any prior history of PE or DVT, no recent surgery, prolonged bed rest, unilateral leg swelling or calf pain, or active cancer. She is not on any oral contraceptive medications.    Past Medical History  Diagnosis Date  . Diabetes mellitus     Dx in [place] but not on meds  . Headache(784.0)   . Obese    Past Surgical History  Procedure Laterality Date  . No past surgeries     Family History  Problem Relation Age of Onset  . Other Neg Hx   . Cancer Father   . Heart disease Sister    History  Substance Use Topics  . Smoking status: Current Every Day Smoker -- 0.25 packs/day    Types: Cigarettes  . Smokeless tobacco: Never Used  . Alcohol Use: No   OB History    Gravida Para Term Preterm AB TAB SAB Ectopic Multiple Living   4 1 1  3  3   1      Review of Systems  All other systems reviewed and are negative.     Allergies  Review of patient's allergies indicates no known allergies.  Home Medications   Prior to Admission medications   Medication Sig Start Date End Date  Taking? Authorizing Provider  tetrahydrozoline-zinc (VISINE-AC) 0.05-0.25 % ophthalmic solution Place 2 drops into both eyes 3 (three) times daily as needed (for allergies).    Historical Provider, MD   BP 145/82 mmHg  Pulse 106  Temp(Src) 98 F (36.7 C) (Oral)  Resp 18  Ht 5\' 7"  (1.702 m)  Wt 380 lb (172.367 kg)  BMI 59.50 kg/m2  SpO2 99%  LMP 11/12/2013 Physical Exam  Constitutional: She appears well-developed and well-nourished. No distress.  Morbidly obese African-American female sitting upright appears to be in no acute distress and nontoxic in appearance  HENT:  Head: Atraumatic.  Right Ear: External ear normal.  Left Ear: External ear normal.  Nose: Nose normal.  Mouth/Throat: Oropharynx is clear and moist. No oropharyngeal exudate.  Eyes: Conjunctivae are normal.  Neck: Normal range of motion. Neck supple.  No nuchal rigidity  Cardiovascular: Intact distal pulses.   Mild tachycardia without murmur rubs or gallops  Pulmonary/Chest: Effort normal and breath sounds normal. No respiratory distress. She exhibits no tenderness.  Abdominal: Soft. There is no tenderness.  Musculoskeletal: She exhibits no edema.  Neurological: She is alert.  Skin: No rash noted.  Psychiatric: She has a normal mood and affect.  Nursing note and vitals reviewed.   ED Course  Procedures (including critical care time)  9:55 AM Patient here with URI symptoms suggestive of viral syndrome. Although she is mildly tachycardic she has no other  significant risk factors concerning for PE. Symptoms unlikely to be ACS. Given the duration of her symptoms, chest x-ray ordered to rule out underlying pneumonia.  12:45 PM Chest x-ray shows no evidence of pneumonia. Patient reports feeling better after receiving treatment. She also requests to have a prescription of Fioricet for her headache as she had in the past and it has helped. Prescription filled. At this time patient is stable for discharge. Return  precautions discussed.  Labs Review Labs Reviewed - No data to display  Imaging Review Dg Chest 2 View  01/07/2014   CLINICAL DATA:  Centralized chest pain  EXAM: CHEST  2 VIEW  COMPARISON:  11/05/2011  FINDINGS: The heart size and mediastinal contours are within normal limits. Both lungs are clear. The visualized skeletal structures are unremarkable.  IMPRESSION: No active cardiopulmonary disease.   Electronically Signed   By: Christiana PellantGretchen  Green M.D.   On: 01/07/2014 11:15     EKG Interpretation None      MDM   Final diagnoses:  Cough  Viral syndrome    BP 126/72 mmHg  Pulse 85  Temp(Src) 98.5 F (36.9 C) (Oral)  Resp 16  Ht 5\' 7"  (1.702 m)  Wt 380 lb (172.367 kg)  BMI 59.50 kg/m2  SpO2 100%  LMP 11/12/2013  I have reviewed nursing notes and vital signs. I personally reviewed the imaging tests through PACS system  I reviewed available ER/hospitalization records thought the EMR     Fayrene HelperBowie Asani Mcburney, PA-C 01/07/14 1246  Doug SouSam Jacubowitz, MD 01/07/14 1644

## 2014-04-17 ENCOUNTER — Encounter (HOSPITAL_COMMUNITY): Payer: Self-pay | Admitting: Adult Health

## 2014-04-17 ENCOUNTER — Emergency Department (HOSPITAL_COMMUNITY)
Admission: EM | Admit: 2014-04-17 | Discharge: 2014-04-18 | Disposition: A | Discharge status: Home / Self Care | Payer: Self-pay | Attending: Emergency Medicine | Admitting: Emergency Medicine

## 2014-04-17 DIAGNOSIS — E119 Type 2 diabetes mellitus without complications: Secondary | ICD-10-CM | POA: Insufficient documentation

## 2014-04-17 DIAGNOSIS — Z720 Tobacco use: Secondary | ICD-10-CM | POA: Insufficient documentation

## 2014-04-17 DIAGNOSIS — Z3202 Encounter for pregnancy test, result negative: Secondary | ICD-10-CM | POA: Insufficient documentation

## 2014-04-17 DIAGNOSIS — N939 Abnormal uterine and vaginal bleeding, unspecified: Secondary | ICD-10-CM | POA: Insufficient documentation

## 2014-04-17 DIAGNOSIS — N12 Tubulo-interstitial nephritis, not specified as acute or chronic: Secondary | ICD-10-CM | POA: Insufficient documentation

## 2014-04-17 LAB — CBC WITH DIFFERENTIAL/PLATELET
BASOS ABS: 0 10*3/uL (ref 0.0–0.1)
BASOS PCT: 0 % (ref 0–1)
Eosinophils Absolute: 0.1 10*3/uL (ref 0.0–0.7)
Eosinophils Relative: 1 % (ref 0–5)
HCT: 36.2 % (ref 36.0–46.0)
Hemoglobin: 11.6 g/dL — ABNORMAL LOW (ref 12.0–15.0)
Lymphocytes Relative: 42 % (ref 12–46)
Lymphs Abs: 3.4 10*3/uL (ref 0.7–4.0)
MCH: 23.7 pg — ABNORMAL LOW (ref 26.0–34.0)
MCHC: 32 g/dL (ref 30.0–36.0)
MCV: 73.9 fL — ABNORMAL LOW (ref 78.0–100.0)
Monocytes Absolute: 0.5 10*3/uL (ref 0.1–1.0)
Monocytes Relative: 7 % (ref 3–12)
NEUTROS ABS: 3.9 10*3/uL (ref 1.7–7.7)
NEUTROS PCT: 50 % (ref 43–77)
Platelets: 428 10*3/uL — ABNORMAL HIGH (ref 150–400)
RBC: 4.9 MIL/uL (ref 3.87–5.11)
RDW: 17.5 % — ABNORMAL HIGH (ref 11.5–15.5)
WBC: 8 10*3/uL (ref 4.0–10.5)

## 2014-04-17 LAB — URINALYSIS, ROUTINE W REFLEX MICROSCOPIC
GLUCOSE, UA: NEGATIVE mg/dL
KETONES UR: 15 mg/dL — AB
Nitrite: POSITIVE — AB
PROTEIN: 100 mg/dL — AB
Specific Gravity, Urine: 1.027 (ref 1.005–1.030)
Urobilinogen, UA: 1 mg/dL (ref 0.0–1.0)
pH: 5 (ref 5.0–8.0)

## 2014-04-17 LAB — COMPREHENSIVE METABOLIC PANEL
ALT: 16 U/L (ref 0–35)
AST: 14 U/L (ref 0–37)
Albumin: 3.3 g/dL — ABNORMAL LOW (ref 3.5–5.2)
Alkaline Phosphatase: 54 U/L (ref 39–117)
Anion gap: 3 — ABNORMAL LOW (ref 5–15)
BUN: 8 mg/dL (ref 6–23)
CHLORIDE: 100 mmol/L (ref 96–112)
CO2: 31 mmol/L (ref 19–32)
CREATININE: 0.71 mg/dL (ref 0.50–1.10)
Calcium: 8.6 mg/dL (ref 8.4–10.5)
GFR calc non Af Amer: >90 mL/min (ref >90)
Glucose, Bld: 216 mg/dL — ABNORMAL HIGH (ref 70–99)
Potassium: 3.7 mmol/L (ref 3.5–5.1)
Sodium: 134 mmol/L — ABNORMAL LOW (ref 135–145)
TOTAL PROTEIN: 7.1 g/dL (ref 6.0–8.3)
Total Bilirubin: 0.4 mg/dL (ref 0.3–1.2)

## 2014-04-17 LAB — POC URINE PREG, ED: Preg Test, Ur: NEGATIVE

## 2014-04-17 LAB — URINE MICROSCOPIC-ADD ON

## 2014-04-17 MED ORDER — HYDROMORPHONE HCL 1 MG/ML IJ SOLN
1.0000 mg | Freq: Once | INTRAMUSCULAR | Status: AC
  Administered 2014-04-18: 1 mg via INTRAVENOUS
  Filled 2014-04-17: qty 1

## 2014-04-17 MED ORDER — ONDANSETRON HCL 4 MG/2ML IJ SOLN
4.0000 mg | Freq: Once | INTRAMUSCULAR | Status: AC
  Administered 2014-04-18: 4 mg via INTRAVENOUS
  Filled 2014-04-17: qty 2

## 2014-04-17 MED ORDER — DEXTROSE 5 % IV SOLN
1.0000 g | Freq: Once | INTRAVENOUS | Status: AC
  Administered 2014-04-18: 1 g via INTRAVENOUS
  Filled 2014-04-17: qty 10

## 2014-04-17 MED ORDER — SODIUM CHLORIDE 0.9 % IV BOLUS (SEPSIS)
1000.0000 mL | Freq: Once | INTRAVENOUS | Status: AC
  Administered 2014-04-18: 1000 mL via INTRAVENOUS

## 2014-04-17 NOTE — ED Notes (Addendum)
Presents with lower and upper abdominal pain that began when she started her menstrual cycle many years ago and comes every time she has a period, this one began today-she also endorses nausea with the pain and vomiting every month before her period comes. She does not have a OBGYN. She reports difficulty emptying her bladder today. Denies heavy bleeding.  Endorses large clots with period.

## 2014-04-18 MED ORDER — KETOROLAC TROMETHAMINE 30 MG/ML IJ SOLN
30.0000 mg | Freq: Once | INTRAMUSCULAR | Status: AC
  Administered 2014-04-18: 30 mg via INTRAVENOUS
  Filled 2014-04-18: qty 1

## 2014-04-18 MED ORDER — ONDANSETRON HCL 4 MG PO TABS: 4.0000 mg | ORAL_TABLET | Freq: Four times a day (QID) | ORAL | Status: DC

## 2014-04-18 MED ORDER — CIPROFLOXACIN HCL 500 MG PO TABS: 500.0000 mg | ORAL_TABLET | Freq: Two times a day (BID) | ORAL | Status: DC

## 2014-04-18 MED ORDER — OXYCODONE-ACETAMINOPHEN 5-325 MG PO TABS: 1.0000 | ORAL_TABLET | Freq: Four times a day (QID) | ORAL | Status: DC | PRN

## 2014-04-18 NOTE — Discharge Instructions (Signed)
Pyelonephritis, Adult °Pyelonephritis is a kidney infection. In general, there are 2 main types of pyelonephritis: °· Infections that come on quickly without any warning (acute pyelonephritis). °· Infections that persist for a long period of time (chronic pyelonephritis). °CAUSES  °Two main causes of pyelonephritis are: °· Bacteria traveling from the bladder to the kidney. This is a problem especially in pregnant women. The urine in the bladder can become filled with bacteria from multiple causes, including: °¨ Inflammation of the prostate gland (prostatitis). °¨ Sexual intercourse in females. °¨ Bladder infection (cystitis). °· Bacteria traveling from the bloodstream to the tissue part of the kidney. °Problems that may increase your risk of getting a kidney infection include: °· Diabetes. °· Kidney stones or bladder stones. °· Cancer. °· Catheters placed in the bladder. °· Other abnormalities of the kidney or ureter. °SYMPTOMS  °· Abdominal pain. °· Pain in the side or flank area. °· Fever. °· Chills. °· Upset stomach. °· Blood in the urine (dark urine). °· Frequent urination. °· Strong or persistent urge to urinate. °· Burning or stinging when urinating. °DIAGNOSIS  °Your caregiver may diagnose your kidney infection based on your symptoms. A urine sample may also be taken. °TREATMENT  °In general, treatment depends on how severe the infection is.  °· If the infection is mild and caught early, your caregiver may treat you with oral antibiotics and send you home. °· If the infection is more severe, the bacteria may have gotten into the bloodstream. This will require intravenous (IV) antibiotics and a hospital stay. Symptoms may include: °¨ High fever. °¨ Severe flank pain. °¨ Shaking chills. °· Even after a hospital stay, your caregiver may require you to be on oral antibiotics for a period of time. °· Other treatments may be required depending upon the cause of the infection. °HOME CARE INSTRUCTIONS  °· Take your  antibiotics as directed. Finish them even if you start to feel better. °· Make an appointment to have your urine checked to make sure the infection is gone. °· Drink enough fluids to keep your urine clear or pale yellow. °· Take medicines for the bladder if you have urgency and frequency of urination as directed by your caregiver. °SEEK IMMEDIATE MEDICAL CARE IF:  °· You have a fever or persistent symptoms for more than 2-3 days. °· You have a fever and your symptoms suddenly get worse. °· You are unable to take your antibiotics or fluids. °· You develop shaking chills. °· You experience extreme weakness or fainting. °· There is no improvement after 2 days of treatment. °MAKE SURE YOU: °· Understand these instructions. °· Will watch your condition. °· Will get help right away if you are not doing well or get worse. °Document Released: 01/19/2005 Document Revised: 07/21/2011 Document Reviewed: 06/25/2010 °ExitCare® Patient Information ©2015 [employer]. This information is not intended to replace advice given to you by your health care provider. Make sure you discuss any questions you have with your health care provider. ° °

## 2014-04-18 NOTE — ED Provider Notes (Signed)
CSN: [id number]     Arrival date & time 04/17/14  2019 History   First MD Initiated Contact with Patient 04/17/14 2258     Chief Complaint  Patient presents with  . Abdominal Pain     (Consider location/radiation/quality/duration/timing/severity/associated sxs/prior Treatment) HPI    PCP: No PCP Per Patient Blood pressure 125/80, pulse 85, temperature 98.3 F (36.8 C), temperature source Oral, resp. rate 14, height  (1.702 m), last menstrual period 04/17/2014, SpO2 96 %.  Lisa Cooley is a 29 y.o.female with a significant PMH of diabetes, headache, morbid obesity presents to the ER with complaints of complaints of suprapubic pain, back pain and urinary hesitancy. She reports over the last couple years been having painful menstrual cycles but that this one is different and more severe because is accompanied by nausea and vomiting. She does not have been gynecologist that she is seen for her irregular periods. She also reports feeling sensation that she needs to urinate but not having any urine come out. She has not yet started her menstrual cycle but reports heavy bleeding with clots when she is currently menstruating.  Negative Review of Symptoms: No fevers, weakness, confusion, headache, neck pain, tachycardia, tachypnia, no hematuria, vaginal bleeding, no recent fall or trauma.  Past Medical History  Diagnosis Date  . Diabetes mellitus     Dx in [place] but not on meds  . Headache(784.0)   . Obese    Past Surgical History  Procedure Laterality Date  . No past surgeries     Family History  Problem Relation Age of Onset  . Other Neg Hx   . Cancer Father   . Heart disease Sister    History  Substance Use Topics  . Smoking status: Current Every Day Smoker -- 0.25 packs/day    Types: Cigarettes  . Smokeless tobacco: Never Used  . Alcohol Use: No   OB History    Gravida Para Term Preterm AB TAB SAB Ectopic Multiple Living   Review of  Systems  10 Systems reviewed and are negative for acute change except as noted in the HPI.    Allergies  Review of patient's allergies indicates no known allergies.  Home Medications   Prior to Admission medications   Medication Sig Start Date End Date Taking? Authorizing Provider  Aspirin-Acetaminophen-Caffeine (PAMPRIN MAX PO) Take 1-2 tablets by mouth daily as needed (mentrual cramps).   Yes Historical Provider, MD  butalbital-acetaminophen-caffeine (FIORICET) 50-325-40 MG per tablet Take 1-2 tablets by mouth every 6 (six) hours as needed for headache. Patient not taking: Reported on 04/17/2014 01/07/14 01/07/15  Fayrene Helper, PA-C  ciprofloxacin (CIPRO) 500 MG tablet Take 1 tablet (500 mg total) by mouth 2 (two) times daily. 04/18/14   Jorey Dollard Neva Seat, PA-C  dextromethorphan-guaiFENesin (MUCINEX DM) 30-600 MG per 12 hr tablet Take 1 tablet by mouth 2 (two) times daily as needed for cough. Patient not taking: Reported on 04/17/2014 01/07/14   Fayrene Helper, PA-C  ondansetron (ZOFRAN) 4 MG tablet Take 1 tablet (4 mg total) by mouth every 6 (six) hours. 04/18/14   Marlon Pel, PA-C  oxyCODONE-acetaminophen (PERCOCET/ROXICET) 5-325 MG per tablet Take 1-2 tablets by mouth every 6 (six) hours as needed. 04/18/14   Ketzaly Cardella Neva Seat, PA-C   BP 133/76 mmHg  Pulse 80  Temp(Src) 98.3 F (36.8 C) (Oral)  Resp 16  Ht  (1.702 m)  SpO2 98%  LMP 04/17/2014 (  Exact Date) Physical Exam  Constitutional: She appears well-developed and well-nourished. No distress.  HENT:  Head: Normocephalic and atraumatic.  Eyes: Pupils are equal, round, and reactive to light.  Neck: Normal range of motion. Neck supple.  Cardiovascular: Normal rate and regular rhythm.   Pulmonary/Chest: Effort normal.  Abdominal: Soft. Bowel sounds are normal. There is tenderness. There is CVA tenderness. There is no rebound and no guarding.  Exam limited by obese omentum.  Neurological: She is alert.  Skin: Skin is warm and dry.   Nursing note and vitals reviewed.   ED Course  Procedures (including critical care time) Labs Review Labs Reviewed  CBC WITH DIFFERENTIAL/PLATELET - Abnormal; Notable for the following:    Hemoglobin 11.6 (*)    MCV 73.9 (*)    MCH 23.7 (*)    RDW 17.5 (*)    Platelets 428 (*)    All other components within normal limits  COMPREHENSIVE METABOLIC PANEL - Abnormal; Notable for the following:    Sodium 134 (*)    Glucose, Bld 216 (*)    Albumin 3.3 (*)    Anion gap 3 (*)    All other components within normal limits  URINALYSIS, ROUTINE W REFLEX MICROSCOPIC - Abnormal; Notable for the following:    Color, Urine RED (*)    APPearance TURBID (*)    Hgb urine dipstick LARGE (*)    Bilirubin Urine MODERATE (*)    Ketones, ur 15 (*)    Protein, ur 100 (*)    Nitrite POSITIVE (*)    Leukocytes, UA MODERATE (*)    All other components within normal limits  URINE MICROSCOPIC-ADD ON - Abnormal; Notable for the following:    Squamous Epithelial / LPF MANY (*)    Bacteria, UA MANY (*)    All other components within normal limits  URINE CULTURE  POC URINE PREG, ED    Imaging Review No results found.   EKG Interpretation None      MDM   Final diagnoses:  Pyelonephritis  Abnormal uterine bleeding    Patient does not appear toxic. She has had no vomiting in the ED.   Medications  ketorolac (TORADOL) 30 MG/ML injection 30 mg (not administered)  HYDROmorphone (DILAUDID) injection 1 mg (1 mg Intravenous Given 04/18/14 0009)  ondansetron (ZOFRAN) injection 4 mg (4 mg Intravenous Given 04/18/14 0009)  sodium chloride 0.9 % bolus 1,000 mL (1,000 mLs Intravenous New Bag/Given 04/18/14 0009)  cefTRIAXone (ROCEPHIN) 1 g in dextrose 5 % 50 mL IVPB (0 g Intravenous Stopped 04/18/14 0051)     She has history of diabetes but does not take medications for this- referred to [facility]. Otherwise her labs are none acute. She does have  urinalysis consistent with Pyelo  with large hgb, moderate leuks, positive nitrites with 21-50 WBC.  Discussed this with patient. Given IV abx and pain medication which has significantly helped her symptoms. Will put her on Cipro and pain medications, referral to [facility].  28 y.o.Lisa Cooley evaluation in the Emergency Department is complete. It has been determined that no acute conditions requiring further emergency intervention are present at this time. The patient/guardian have been advised of the diagnosis and plan. We have discussed signs and symptoms that warrant return to the ED, such as changes or worsening in symptoms.  Vital signs are stable at discharge. Filed Vitals:   04/18/14 0100  BP: 133/76  Pulse: 80  Temp:   Resp: 16  Patient/guardian has voiced understanding and agreed to follow-up with the PCP or specialist.   Marlon Peliffany Andalyn Heckstall, PA-C 04/18/14 0133  Samuel JesterKathleen McManus, DO 04/20/14 1256

## 2014-04-19 LAB — URINE CULTURE: Colony Count: 90000

## 2014-04-20 ENCOUNTER — Ambulatory Visit: Payer: Self-pay | Attending: Internal Medicine | Admitting: Family Medicine

## 2014-04-20 ENCOUNTER — Encounter: Payer: Self-pay | Admitting: Family Medicine

## 2014-04-20 VITALS — BP 144/80 | HR 85 | Temp 98.5°F | Resp 16 | Ht 67.5 in | Wt 376.0 lb

## 2014-04-20 DIAGNOSIS — E1165 Type 2 diabetes mellitus with hyperglycemia: Secondary | ICD-10-CM | POA: Insufficient documentation

## 2014-04-20 DIAGNOSIS — N10 Acute tubulo-interstitial nephritis: Secondary | ICD-10-CM | POA: Insufficient documentation

## 2014-04-20 DIAGNOSIS — R1084 Generalized abdominal pain: Secondary | ICD-10-CM | POA: Insufficient documentation

## 2014-04-20 LAB — GLUCOSE, POCT (MANUAL RESULT ENTRY): POC Glucose: 239 mg/dl — AB (ref 70–99)

## 2014-04-20 LAB — POCT GLYCOSYLATED HEMOGLOBIN (HGB A1C): HEMOGLOBIN A1C: 9.4

## 2014-04-20 MED ORDER — ACCU-CHEK SOFT TOUCH LANCETS MISC
Status: DC
Start: 2014-04-20 — End: 2016-02-28

## 2014-04-20 MED ORDER — TRAMADOL HCL 50 MG PO TABS: 50.0000 mg | ORAL_TABLET | Freq: Three times a day (TID) | ORAL | Status: DC | PRN

## 2014-04-20 MED ORDER — METFORMIN HCL 1000 MG PO TABS: 1000.0000 mg | ORAL_TABLET | Freq: Two times a day (BID) | ORAL | Status: DC

## 2014-04-20 MED ORDER — GLUCOSE BLOOD VI STRP: ORAL_STRIP | Status: DC

## 2014-04-20 MED ORDER — ACCU-CHEK AVIVA PLUS W/DEVICE KIT: 1.0000 | PACK | Freq: Three times a day (TID) | Status: DC

## 2014-04-20 NOTE — Patient Instructions (Signed)
1.  Stop Percocet and use Tramadol for pain. It is less likely to cause nausea and vomiting and itching. 2.  Start metformin 1000 twice a day. 3.  Check BS 3 times a day for next two weeks and bring readings for nurse visit in two weeks. 4.  Follow-up on Monday if urinary symptoms not resolved.

## 2014-04-20 NOTE — Progress Notes (Signed)
Patient having abdominal pain today and has been treated with cipro and oxycodone for paiin but she reports it is causing her to itch.  Patient arrived as a ED follow up walk in this am.  She does not have a PCP and is here to establish care,set up with financial counseling, and get a OB/GYN referral  Nalini Silvio PateShontia Demorest is a 29 y.o.female with a significant PMH of diabetes, headache, morbid obesity presents to the ER with complaints of complaints of suprapubic pain, back pain and urinary hesitancy. She reports over the last couple years been having painful menstrual cycles but that this one is different and more severe because is accompanied by nausea and vomiting. She does not have been gynecologist that she is seen for her irregular periods. She also reports feeling sensation that she needs to urinate but not having any urine come out. She has not yet started her menstrual cycle but reports heavy bleeding with clots when she is currently menstruating.

## 2014-04-20 NOTE — Progress Notes (Signed)
Patient ID: Lisa Cooley, female   DOB: March 19, 1985, 29 y.o.   MRN: [id number] Complaint: ED follow-up pylonephritis.  Subjective:  Patient is here for follow-up ED visit for pylonephritis. She also has a history of diabetes but has had no medication for a least a year. She was not metformin but was told to stop as she was no longer diabetic.    ROS:  GEN:   Denies fever, chills,WT loss Skin:   Denies lesions or rashes HENT:   Denies  earache, epistaxis, sore throat, or neck pain,    Admits  Frequent  headaches                LUNGS:  Denies SOB with rest     Admits SOB with. walking CV:   Denies CP or palpitations ABD:  Admits:abdominal pain, nausea,and vomiting            EXT:    Denies muscle spasms or swelling; no pain in lower ext, no weakness NEURO:   or tingling, denies sz, Hx of stroke.   Admits to off and on numbness for feet.  Objective:  Filed Vitals:   04/20/14 1004  BP: 144/80  Pulse: 85  Temp: 98.5 F (36.9 C)  Resp: 16  Height: 5' 7.5" (1.715 m)  Weight: 376 lb (170.552 kg)  SpO2: 98%    Physical Exam:  General:  in no acute distress, morbidly obese HEENT:  no pallor, no icterus, moist oral mucosa, no  lymphadenopathy Heart:   Normal  s1 &s2  Regular rate and rhythm, without M,G,R Lungs:   Clear to auscultation bilaterally. Abdomen:  Soft,, nondistended, positive bowel sounds., some generalized tenderness, Exetremeties:  No pedal edema,pedal pulses normal. Neuro:   Alert, awake, oriented x3, nonfocal.  Pertinent Lab Results:  A1C 9+ today  Results for Lisa, Cooley (MRN [id number]) as of 04/20/2014 10:23  Ref. Range 04/17/2014 21:30  Sodium Latest Range: 135-145 mmol/L 134 (L)  Potassium Latest Range: 3.5-5.1 mmol/L 3.7  Chloride Latest Range: 96-112 mmol/L 100  CO2 Latest Range: 19-32 mmol/L 31  BUN Latest Range: 6-23 mg/dL 8  Creatinine Latest Range: 0.50-1.10 mg/dL 2.13  Calcium Latest Range: 8.4-10.5 mg/dL 8.6  GFR  calc non Af Amer Latest Range: >90 mL/min >90  GFR calc Af Amer Latest Range: >90 mL/min >90  Glucose Latest Range: 70-99 mg/dL 086 (H)  Anion gap Latest Range: 5-15  3 (L)  Alkaline Phosphatase Latest Range: 39-117 U/L 54  Albumin Latest Range: 3.5-5.2 g/dL 3.3 (L)  AST Latest Range: 0-37 U/L 14  ALT Latest Range: 0-35 U/L 16  Total Protein Latest Range: 6.0-8.3 g/dL 7.1  Total Bilirubin Latest Range: 0.3-1.2 mg/dL 0.4  WBC Latest Range: 4.0-10.5 K/uL 8.0  RBC Latest Range: 3.87-5.11 MIL/uL 4.90  Hemoglobin Latest Range: 12.0-15.0 g/dL 57.8 (L)  HCT Latest Range: 36.0-46.0 % 36.2  MCV Latest Range: 78.0-100.0 fL 73.9 (L)  MCH Latest Range: 26.0-34.0 pg 23.7 (L)  MCHC Latest Range: 30.0-36.0 g/dL 46.9  RDW Latest Range: 11.5-15.5 % 17.5 (H)  Platelets Latest Range: 150-400 K/uL 428 (H)  Neutrophils Relative % Latest Range: 43-77 % 50  Lymphocytes Relative Latest Range: 12-46 % 42  Monocytes Relative Latest Range: 3-12 % 7  Eosinophils Relative Latest Range: 0-5 % 1  Basophils Relative Latest Range: 0-1 % 0  NEUT# Latest Range: 1.7-7.7 K/uL 3.9  Lymphocytes Absolute Latest Range: 0.7-4.0 K/uL 3.4  Monocytes Absolute Latest Range: 0.1-1.0 K/uL 0.5  Eosinophils Absolute Latest  Range: 0.0-0.7 K/uL 0.1  Basophils Absolute Latest Range: 0.0-0.1 K/uL 0.0  Color, Urine Latest Range: YELLOW  RED (A)  APPearance Latest Range: CLEAR  TURBID (A)  Specific Gravity, Urine Latest Range: 1.005-1.030  1.027  pH Latest Range: 5.0-8.0  5.0  Glucose Latest Range: NEGATIVE mg/dL NEGATIVE  Bilirubin Urine Latest Range: NEGATIVE  MODERATE (A)  Ketones, ur Latest Range: NEGATIVE mg/dL 15 (A)  Protein Latest Range: NEGATIVE mg/dL 409100 (A)  Urobilinogen, UA Latest Range: 0.0-1.0 mg/dL 1.0  Nitrite Latest Range: NEGATIVE  POSITIVE (A)  Leukocytes, UA Latest Range: NEGATIVE  MODERATE (A)  Hgb urine dipstick Latest Range: NEGATIVE  LARGE (A)  Urine-Other No range found MUCOUS PRESENT  WBC, UA Latest  Range: <3 WBC/hpf 21-50  RBC / HPF Latest Range: <3 RBC/hpf 11-20  Squamous Epithelial / LPF Latest Range: RARE  MANY (A)  Bacteria, UA Latest Range: RARE  MANY (A)  URINE CULTURE No range found Rpt     Medications: Prior to Admission medications   Medication Sig Start Date End Date Taking? Authorizing Provider  ciprofloxacin (CIPRO) 500 MG tablet Take 1 tablet (500 mg total) by mouth 2 (two) times daily. 04/18/14  Yes Tiffany Neva SeatGreene, PA-C  oxyCODONE-acetaminophen (PERCOCET/ROXICET) 5-325 MG per tablet Take 1-2 tablets by mouth every 6 (six) hours as needed. 04/18/14  Yes Tiffany Neva SeatGreene, PA-C  Aspirin-Acetaminophen-Caffeine (PAMPRIN MAX PO) Take 1-2 tablets by mouth daily as needed (mentrual cramps).    Historical Provider, MD  dextromethorphan-guaiFENesin (MUCINEX DM) 30-600 MG per 12 hr tablet Take 1 tablet by mouth 2 (two) times daily as needed for cough. Patient not taking: Reported on 04/17/2014 01/07/14   Fayrene HelperBowie Tran, PA-C  ondansetron (ZOFRAN) 4 MG tablet Take 1 tablet (4 mg total) by mouth every 6 (six) hours. Patient not taking: Reported on 04/20/2014 04/18/14   Marlon Peliffany Greene, PA-C    Assessment: 1. Pylonephritis 2. Diabetes, type II 3. N&V probably related to Percocet  Plan: 1. Continue Cipro and follow-up on Monday if not improved. 2. Start metformin 1000 bid. Return in a week for nurse visit for BS check     Check BS 3 times daily and bring readings 3. Stop Percocet and use Tramadol for pain.  Follow up:  The patient was given clear instructions to go to ER or return to medical center if symptoms don't improve, worsen or new problems develop. The patient verbalized understanding. The patient was told to call to get lab results if they haven't heard anything in the next week.   This note has been created with Education officer, environmentalDragon speech recognition software and smart phrase technology. Any transcriptional errors are unintentional.   Henrietta HooverLinda C. Demontrez Rindfleisch, FNP,BC 04/20/2014, 10:43 AM

## 2014-05-08 ENCOUNTER — Ambulatory Visit: Payer: Self-pay | Attending: Internal Medicine | Admitting: *Deleted

## 2014-05-08 VITALS — BP 132/84 | HR 101 | Temp 99.1°F | Resp 22

## 2014-05-08 DIAGNOSIS — E119 Type 2 diabetes mellitus without complications: Secondary | ICD-10-CM | POA: Insufficient documentation

## 2014-05-08 DIAGNOSIS — E1165 Type 2 diabetes mellitus with hyperglycemia: Secondary | ICD-10-CM

## 2014-05-08 LAB — GLUCOSE, POCT (MANUAL RESULT ENTRY): POC GLUCOSE: 224 mg/dL — AB (ref 70–99)

## 2014-05-08 MED ORDER — TRAMADOL HCL 50 MG PO TABS: 50.0000 mg | ORAL_TABLET | Freq: Three times a day (TID) | ORAL | Status: DC | PRN

## 2014-05-08 NOTE — Patient Instructions (Addendum)
Diabetes Mellitus and Food It is important for you to manage your blood sugar (glucose) level. Your blood glucose level can be greatly affected by what you eat. Eating healthier foods in the appropriate amounts throughout the day at about the same time each day will help you control your blood glucose level. It can also help slow or prevent worsening of your diabetes mellitus. Healthy eating may even help you improve the level of your blood pressure and reach or maintain a healthy weight.  HOW CAN FOOD AFFECT ME? Carbohydrates Carbohydrates affect your blood glucose level more than any other type of food. Your dietitian will help you determine how many carbohydrates to eat at each meal and teach you how to count carbohydrates. Counting carbohydrates is important to keep your blood glucose at a healthy level, especially if you are using insulin or taking certain medicines for diabetes mellitus. Alcohol Alcohol can cause sudden decreases in blood glucose (hypoglycemia), especially if you use insulin or take certain medicines for diabetes mellitus. Hypoglycemia can be a life-threatening condition. Symptoms of hypoglycemia (sleepiness, dizziness, and disorientation) are similar to symptoms of having too much alcohol.  If your health care provider has given you approval to drink alcohol, do so in moderation and use the following guidelines:  Women should not have more than one drink per day, and men should not have more than two drinks per day. One drink is equal to:  12 oz of beer.  5 oz of wine.  1 oz of hard liquor.  Do not drink on an empty stomach.  Keep yourself hydrated. Have water, diet soda, or unsweetened iced tea.  Regular soda, juice, and other mixers might contain a lot of carbohydrates and should be counted. WHAT FOODS ARE NOT RECOMMENDED? As you make food choices, it is important to remember that all foods are not the same. Some foods have fewer nutrients per serving than other  foods, even though they might have the same number of calories or carbohydrates. It is difficult to get your body what it needs when you eat foods with fewer nutrients. Examples of foods that you should avoid that are high in calories and carbohydrates but low in nutrients include:  Trans fats (most processed foods list trans fats on the Nutrition Facts label).  Regular soda.  Juice.  Candy.  Sweets, such as cake, pie, doughnuts, and cookies.  Fried foods. WHAT FOODS CAN I EAT? Have nutrient-rich foods, which will nourish your body and keep you healthy. The food you should eat also will depend on several factors, including:  The calories you need.  The medicines you take.  Your weight.  Your blood glucose level.  Your blood pressure level.  Your cholesterol level. You also should eat a variety of foods, including:  Protein, such as meat, poultry, fish, tofu, nuts, and seeds (lean animal proteins are best).  Fruits.  Vegetables.  Dairy products, such as milk, cheese, and yogurt (low fat is best).  Breads, grains, pasta, cereal, rice, and beans.  Fats such as olive oil, trans fat-free margarine, canola oil, avocado, and olives. DOES EVERYONE WITH DIABETES MELLITUS HAVE THE SAME MEAL PLAN? Because every person with diabetes mellitus is different, there is not one meal plan that works for everyone. It is very important that you meet with a dietitian who will help you create a meal plan that is just right for you. Document Released: 10/16/2004 Document Revised: 01/24/2013 Document Reviewed: 12/16/2012 [employer] Patient Information 2015 [employer]. This   information is not intended to replace advice given to you by your health care provider. Make sure you discuss any questions you have with your health care provider. Diabetes and Exercise Exercising regularly is important. It is not just about losing weight. It has many health benefits, such as:  Improving your overall  fitness, flexibility, and endurance.  Increasing your bone density.  Helping with weight control.  Decreasing your body fat.  Increasing your muscle strength.  Reducing stress and tension.  Improving your overall health. People with diabetes who exercise gain additional benefits because exercise:  Reduces appetite.  Improves the body's use of blood sugar (glucose).  Helps lower or control blood glucose.  Decreases blood pressure.  Helps control blood lipids (such as cholesterol and triglycerides).  Improves the body's use of the hormone insulin by:  Increasing the body's insulin sensitivity.  Reducing the body's insulin needs.  Decreases the risk for heart disease because exercising:  Lowers cholesterol and triglycerides levels.  Increases the levels of good cholesterol (such as high-density lipoproteins [HDL]) in the body.  Lowers blood glucose levels. YOUR ACTIVITY PLAN  Choose an activity that you enjoy and set realistic goals. Your health care provider or diabetes educator can help you make an activity plan that works for you. Exercise regularly as directed by your health care provider. This includes:  Performing resistance training twice a week such as push-ups, sit-ups, lifting weights, or using resistance bands.  Performing 150 minutes of cardio exercises each week such as walking, running, or playing sports.  Staying active and spending no more than 90 minutes at one time being inactive. Even short bursts of exercise are good for you. Three 10-minute sessions spread throughout the day are just as beneficial as a single 30-minute session. Some exercise ideas include:  Taking the dog for a walk.  Taking the stairs instead of the elevator.  Dancing to your favorite song.  Doing an exercise video.  Doing your favorite exercise with a friend. RECOMMENDATIONS FOR EXERCISING WITH TYPE 1 OR TYPE 2 DIABETES   Check your blood glucose before exercising. If  blood glucose levels are greater than 240 mg/dL, check for urine ketones. Do not exercise if ketones are present.  Avoid injecting insulin into areas of the body that are going to be exercised. For example, avoid injecting insulin into:  The arms when playing tennis.  The legs when jogging.  Keep a record of:  Food intake before and after you exercise.  Expected peak times of insulin action.  Blood glucose levels before and after you exercise.  The type and amount of exercise you have done.  Review your records with your health care provider. Your health care provider will help you to develop guidelines for adjusting food intake and insulin amounts before and after exercising.  If you take insulin or oral hypoglycemic agents, watch for signs and symptoms of hypoglycemia. They include:  Dizziness.  Shaking.  Sweating.  Chills.  Confusion.  Drink plenty of water while you exercise to prevent dehydration or heat stroke. Body water is lost during exercise and must be replaced.  Talk to your health care provider before starting an exercise program to make sure it is safe for you. Remember, almost any type of activity is better than none. Document Released: 04/11/2003 Document Revised: 06/05/2013 Document Reviewed: 06/28/2012 [employer] Patient Information 2015 [employer]. This information is not intended to replace advice given to you by your health care provider. Make sure you discuss any   questions you have with your health care provider. Basic Carbohydrate Counting for Diabetes Mellitus Carbohydrate counting is a method for keeping track of the amount of carbohydrates you eat. Eating carbohydrates naturally increases the level of sugar (glucose) in your blood, so it is important for you to know the amount that is okay for you to have in every meal. Carbohydrate counting helps keep the level of glucose in your blood within normal limits. The amount of carbohydrates allowed is  different for every person. A dietitian can help you calculate the amount that is right for you. Once you know the amount of carbohydrates you can have, you can count the carbohydrates in the foods you want to eat. Carbohydrates are found in the following foods:  Grains, such as breads and cereals.  Dried beans and soy products.  Starchy vegetables, such as potatoes, peas, and corn.  Fruit and fruit juices.  Milk and yogurt.  Sweets and snack foods, such as cake, cookies, candy, chips, soft drinks, and fruit drinks. CARBOHYDRATE COUNTING There are two ways to count the carbohydrates in your food. You can use either of the methods or a combination of both. Reading the "Nutrition Facts" on Packaged Food The "Nutrition Facts" is an area that is included on the labels of almost all packaged food and beverages in the [place]. It includes the serving size of that food or beverage and information about the nutrients in each serving of the food, including the grams (g) of carbohydrate per serving.  Decide the number of servings of this food or beverage that you will be able to eat or drink. Multiply that number of servings by the number of grams of carbohydrate that is listed on the label for that serving. The total will be the amount of carbohydrates you will be having when you eat or drink this food or beverage. Learning Standard Serving Sizes of Food When you eat food that is not packaged or does not include "Nutrition Facts" on the label, you need to measure the servings in order to count the amount of carbohydrates.A serving of most carbohydrate-rich foods contains about 15 g of carbohydrates. The following list includes serving sizes of carbohydrate-rich foods that provide 15 g ofcarbohydrate per serving:   1 slice of bread (1 oz) or 1 six-inch tortilla.    of a hamburger bun or English muffin.  4-6 crackers.   cup unsweetened dry cereal.    cup hot cereal.   cup rice or  pasta.    cup mashed potatoes or  of a large baked potato.  1 cup fresh fruit or one small piece of fruit.    cup canned or frozen fruit or fruit juice.  1 cup milk.   cup plain fat-free yogurt or yogurt sweetened with artificial sweeteners.   cup cooked dried beans or starchy vegetable, such as peas, corn, or potatoes.  Decide the number of standard-size servings that you will eat. Multiply that number of servings by 15 (the grams of carbohydrates in that serving). For example, if you eat 2 cups of strawberries, you will have eaten 2 servings and 30 g of carbohydrates (2 servings x 15 g = 30 g). For foods such as soups and casseroles, in which more than one food is mixed in, you will need to count the carbohydrates in each food that is included. EXAMPLE OF CARBOHYDRATE COUNTING Sample Dinner  3 oz chicken breast.   cup of brown rice.   cup of corn.    1 cup milk.   1 cup strawberries with sugar-free whipped topping.  Carbohydrate Calculation Step 1: Identify the foods that contain carbohydrates:   Rice.   Corn.   Milk.   Strawberries. Step 2:Calculate the number of servings eaten of each:   2 servings of rice.   1 serving of corn.   1 serving of milk.   1 serving of strawberries. Step 3: Multiply each of those number of servings by 15 g:   2 servings of rice x 15 g = 30 g.   1 serving of corn x 15 g = 15 g.   1 serving of milk x 15 g = 15 g.   1 serving of strawberries x 15 g = 15 g. Step 4: Add together all of the amounts to find the total grams of carbohydrates eaten: 30 g + 15 g + 15 g + 15 g = 75 g. Document Released: 01/19/2005 Document Revised: 06/05/2013 Document Reviewed: 12/16/2012 [facility] Patient Information 2015 [place], [place]. This information is not intended to replace advice given to you by your health care provider. Make sure you discuss any questions you have with your health care provider. DASH Eating Plan DASH stands  for "Dietary Approaches to Stop Hypertension." The DASH eating plan is a healthy eating plan that has been shown to reduce high blood pressure (hypertension). Additional health benefits may include reducing the risk of type 2 diabetes mellitus, heart disease, and stroke. The DASH eating plan may also help with weight loss. WHAT DO I NEED TO KNOW ABOUT THE DASH EATING PLAN? For the DASH eating plan, you will follow these general guidelines:  Choose foods with a percent daily value for sodium of less than 5% (as listed on the food label).  Use salt-free seasonings or herbs instead of table salt or sea salt.  Check with your health care provider or pharmacist before using salt substitutes.  Eat lower-sodium products, often labeled as "lower sodium" or "no salt added."  Eat fresh foods.  Eat more vegetables, fruits, and low-fat dairy products.  Choose whole grains. Look for the word "whole" as the first word in the ingredient list.  Choose fish and skinless chicken or [place] more often than red meat. Limit fish, poultry, and meat to 6 oz (170 g) each day.  Limit sweets, desserts, sugars, and sugary drinks.  Choose heart-healthy fats.  Limit cheese to 1 oz (28 g) per day.  Eat more home-cooked food and less restaurant, buffet, and fast food.  Limit fried foods.  Cook foods using methods other than frying.  Limit canned vegetables. If you do use them, rinse them well to decrease the sodium.  When eating at a restaurant, ask that your food be prepared with less salt, or no salt if possible. WHAT FOODS CAN I EAT? Seek help from a dietitian for individual calorie needs. Grains Whole grain or whole wheat bread. Brown rice. Whole grain or whole wheat pasta. Quinoa, bulgur, and whole grain cereals. Low-sodium cereals. Corn or whole wheat flour tortillas. Whole grain cornbread. Whole grain crackers. Low-sodium crackers. Vegetables Fresh or frozen vegetables (raw, steamed, roasted, or  grilled). Low-sodium or reduced-sodium tomato and vegetable juices. Low-sodium or reduced-sodium tomato sauce and paste. Low-sodium or reduced-sodium canned vegetables.  Fruits All fresh, canned (in natural juice), or frozen fruits. Meat and Other Protein Products Ground beef (85% or leaner), grass-fed beef, or beef trimmed of fat. Skinless chicken or [place]. Ground chicken or [place]. Pork trimmed of fat. All  fish and seafood. Eggs. Dried beans, peas, or lentils. Unsalted nuts and seeds. Unsalted canned beans. Dairy Low-fat dairy products, such as skim or 1% milk, 2% or reduced-fat cheeses, low-fat ricotta or cottage cheese, or plain low-fat yogurt. Low-sodium or reduced-sodium cheeses. Fats and Oils Tub margarines without trans fats. Light or reduced-fat mayonnaise and salad dressings (reduced sodium). Avocado. Safflower, olive, or canola oils. Natural peanut or almond butter. Other Unsalted popcorn and pretzels. The items listed above may not be a complete list of recommended foods or beverages. Contact your dietitian for more options. WHAT FOODS ARE NOT RECOMMENDED? Grains White bread. White pasta. White rice. Refined cornbread. Bagels and croissants. Crackers that contain trans fat. Vegetables Creamed or fried vegetables. Vegetables in a cheese sauce. Regular canned vegetables. Regular canned tomato sauce and paste. Regular tomato and vegetable juices. Fruits Dried fruits. Canned fruit in light or heavy syrup. Fruit juice. Meat and Other Protein Products Fatty cuts of meat. Ribs, chicken wings, bacon, sausage, bologna, salami, chitterlings, fatback, hot dogs, bratwurst, and packaged luncheon meats. Salted nuts and seeds. Canned beans with salt. Dairy Whole or 2% milk, cream, half-and-half, and cream cheese. Whole-fat or sweetened yogurt. Full-fat cheeses or blue cheese. Nondairy creamers and whipped toppings. Processed cheese, cheese spreads, or cheese curds. Condiments Onion and garlic  salt, seasoned salt, table salt, and sea salt. Canned and packaged gravies. Worcestershire sauce. Tartar sauce. Barbecue sauce. Teriyaki sauce. Soy sauce, including reduced sodium. Steak sauce. Fish sauce. Oyster sauce. Cocktail sauce. Horseradish. Ketchup and mustard. Meat flavorings and tenderizers. Bouillon cubes. Hot sauce. Tabasco sauce. Marinades. Taco seasonings. Relishes. Fats and Oils Butter, stick margarine, lard, shortening, ghee, and bacon fat. Coconut, palm kernel, or palm oils. Regular salad dressings. Other Pickles and olives. Salted popcorn and pretzels. The items listed above may not be a complete list of foods and beverages to avoid. Contact your dietitian for more information. WHERE CAN I FIND MORE INFORMATION? National Heart, Lung, and Blood Institute: [email or web address] Document Released: 01/08/2011 Document Revised: 06/05/2013 Document Reviewed: 11/23/2012 [facility] Patient Information 2015 [place], [place]. This information is not intended to replace advice given to you by your health care provider. Make sure you discuss any questions you have with your health care provider. Hypoglycemia Hypoglycemia occurs when the glucose in your blood is too low. Glucose is a type of sugar that is your body's main energy source. Hormones, such as insulin and glucagon, control the level of glucose in the blood. Insulin lowers blood glucose and glucagon increases blood glucose. Having too much insulin in your blood stream, or not eating enough food containing sugar, can result in hypoglycemia. Hypoglycemia can happen to people with or without diabetes. It can develop quickly and can be a medical emergency.  CAUSES   Missing or delaying meals.  Not eating enough carbohydrates at meals.  Taking too much diabetes medicine.  Not timing your oral diabetes medicine or insulin doses with meals, snacks, and exercise.  Nausea and vomiting.  Certain  medicines.  Severe illnesses, such as hepatitis, kidney disorders, and certain eating disorders.  Increased activity or exercise without eating something extra or adjusting medicines.  Drinking too much alcohol.  A nerve disorder that affects body functions like your heart rate, blood pressure, and digestion (autonomic neuropathy).  A condition where the stomach muscles do not function properly (gastroparesis). Therefore, medicines and food may not absorb properly.  Rarely, a tumor of the pancreas can produce too much insulin. SYMPTOMS   Hunger.  Sweating (diaphoresis).  Change in body temperature.  Shakiness.  Headache.  Anxiety.  Lightheadedness.  Irritability.  Difficulty concentrating.  Dry mouth.  Tingling or numbness in the hands or feet.  Restless sleep or sleep disturbances.  Altered speech and coordination.  Change in mental status.  Seizures or prolonged convulsions.  Combativeness.  Drowsiness (lethargic).  Weakness.  Increased heart rate or palpitations.  Confusion.  Pale, gray skin color.  Blurred or double vision.  Fainting. DIAGNOSIS  A physical exam and medical history will be performed. Your caregiver may make a diagnosis based on your symptoms. Blood tests and other lab tests may be performed to confirm a diagnosis. Once the diagnosis is made, your caregiver will see if your signs and symptoms go away once your blood glucose is raised.  TREATMENT  Usually, you can easily treat your hypoglycemia when you notice symptoms.  Check your blood glucose. If it is less than 70 mg/dl, take one of the following:   3-4 glucose tablets.    cup juice.    cup regular soda.   1 cup skim milk.   -1 tube of glucose gel.   5-6 hard candies.   Avoid high-fat drinks or food that may delay a rise in blood glucose levels.  Do not take more than the recommended amount of sugary foods, drinks, gel, or tablets. Doing so will cause  your blood glucose to go too high.   Wait 10-15 minutes and recheck your blood glucose. If it is still less than 70 mg/dl or below your target range, repeat treatment.   Eat a snack if it is more than 1 hour until your next meal.  There may be a time when your blood glucose may go so low that you are unable to treat yourself at home when you start to notice symptoms. You may need someone to help you. You may even faint or be unable to swallow. If you cannot treat yourself, someone will need to bring you to the hospital.  HOME CARE INSTRUCTIONS  If you have diabetes, follow your diabetes management plan by:  Taking your medicines as directed.  Following your exercise plan.  Following your meal plan. Do not skip meals. Eat on time.  Testing your blood glucose regularly. Check your blood glucose before and after exercise. If you exercise longer or different than usual, be sure to check blood glucose more frequently.  Wearing your medical alert jewelry that says you have diabetes.  Identify the cause of your hypoglycemia. Then, develop ways to prevent the recurrence of hypoglycemia.  Do not take a hot bath or shower right after an insulin shot.  Always carry treatment with you. Glucose tablets are the easiest to carry.  If you are going to drink alcohol, drink it only with meals.  Tell friends or family members ways to keep you safe during a seizure. This may include removing hard or sharp objects from the area or turning you on your side.  Maintain a healthy weight. SEEK MEDICAL CARE IF:   You are having problems keeping your blood glucose in your target range.  You are having frequent episodes of hypoglycemia.  You feel you might be having side effects from your medicines.  You are not sure why your blood glucose is dropping so low.  You notice a change in vision or a new problem with your vision. SEEK IMMEDIATE MEDICAL CARE IF:   Confusion develops.  A change in mental  status occurs.  The inability to swallow  develops.  Fainting occurs. Document Released: 01/19/2005 Document Revised: 01/24/2013 Document Reviewed: 05/18/2011 [facility] Patient Information 2015 [place]. This information is not intended to replace advice given to you by your health care provider. Make sure you discuss any questions you have with your health care provider.

## 2014-05-08 NOTE — Progress Notes (Signed)
Patient presents for CBG and record review for T2DM Med list reviewed; patient reports taking all meds as directed Patient's AM fasting blood sugars ranging 67-207 S/sx of hypoglycemia and immediate actions to take discussed in detail Patient's before dinner blood sugars ranging 131-174 C/o 1 year hx of headaches, muscle spasms in back, dizziness States she did not receive script for tramadol at last visit; NP notified  CBG 224  BP 132/84  left arm manually with large cuff P 101 R 22 T  99.1oral SpO2 98%  Instructed to schedule first available appt with a PCP  Patient advised to call for med refills at least 7 days before running out so as not to go without.  Patient given literature on DASH Eating Plan, Diabetes and Food, Diabetes and Exercise, Basic Carb Counting, Hypoglycemia and The Plate Method

## 2014-05-16 ENCOUNTER — Ambulatory Visit: Payer: Self-pay | Attending: Internal Medicine | Admitting: Internal Medicine

## 2014-05-16 ENCOUNTER — Encounter: Payer: Self-pay | Admitting: Internal Medicine

## 2014-05-16 VITALS — BP 129/85 | HR 99 | Temp 97.9°F | Resp 15 | Ht 67.0 in | Wt 378.0 lb

## 2014-05-16 DIAGNOSIS — Z6841 Body Mass Index (BMI) 40.0 and over, adult: Secondary | ICD-10-CM | POA: Insufficient documentation

## 2014-05-16 DIAGNOSIS — E119 Type 2 diabetes mellitus without complications: Secondary | ICD-10-CM | POA: Insufficient documentation

## 2014-05-16 LAB — GLUCOSE, POCT (MANUAL RESULT ENTRY): POC Glucose: 217 mg/dl — AB (ref 70–99)

## 2014-05-16 NOTE — Progress Notes (Signed)
Patient ID: Lisa Cooley, female   DOB: November 02, 1985, 29 y.o.   MRN: [id number]  CC: DM f/u  HPI: Lisa Cooley is a 29 y.o. female here today for a follow up visit.  Patient has past medical history of obesity and T2DM.  She reports that she was just informed last month of her diagnoses of diabetes. She states that she was told in the past that should would likely develop diabetes if she did not lose weight but never went back to get checked. Patient reports that she really struggles with weight loss and food cravings. She has started taking Metformin and notes some GI upset but believes it is tolerable.   No Known Allergies Past Medical History  Diagnosis Date  . Diabetes mellitus     Dx in [place] but not on meds  . Headache(784.0)   . Obese    Current Outpatient Prescriptions on File Prior to Visit  Medication Sig Dispense Refill  . Blood Glucose Monitoring Suppl (ACCU-CHEK AVIVA PLUS) W/DEVICE KIT 1 kit by Does not apply route 3 (three) times daily. Check BS 3 times a day 1 kit 0  . ciprofloxacin (CIPRO) 500 MG tablet Take 1 tablet (500 mg total) by mouth 2 (two) times daily. 28 tablet 0  . glucose blood (ACCU-CHEK AVIVA) test strip Use as instructed 100 each 12  . Lancets (ACCU-CHEK SOFT TOUCH) lancets Use as instructed 100 each 12  . metFORMIN (GLUCOPHAGE) 1000 MG tablet Take 1 tablet (1,000 mg total) by mouth 2 (two) times daily with a meal. 180 tablet 3  . ondansetron (ZOFRAN) 4 MG tablet Take 1 tablet (4 mg total) by mouth every 6 (six) hours. 12 tablet 0  . oxyCODONE-acetaminophen (PERCOCET/ROXICET) 5-325 MG per tablet Take 1-2 tablets by mouth every 6 (six) hours as needed. 20 tablet 0  . traMADol (ULTRAM) 50 MG tablet Take 1 tablet (50 mg total) by mouth every 8 (eight) hours as needed. 30 tablet 0  . Aspirin-Acetaminophen-Caffeine (PAMPRIN MAX PO) Take 1-2 tablets by mouth daily as needed (mentrual cramps).    [name] dextromethorphan-guaiFENesin (MUCINEX DM) 30-600 MG per 12  hr tablet Take 1 tablet by mouth 2 (two) times daily as needed for cough. (Patient not taking: Reported on 04/17/2014) 20 tablet 0   No current facility-administered medications on file prior to visit.   Family History  Problem Relation Age of Onset  . Other Neg Hx   . Cancer Father   . Heart disease Sister    History   Social History  . Marital Status: Single    Spouse Name: N/A  . Number of Children: N/A  . Years of Education: N/A   Occupational History  . Not on file.   Social History Main Topics  . Smoking status: Former Smoker -- 0.25 packs/day    Types: Cigarettes    Quit date: 04/20/2014  . Smokeless tobacco: Never Used  . Alcohol Use: No  . Drug Use: Yes    Special: Marijuana     Comment: last use 2 days ago  . Sexual Activity: Yes    Birth Control/ Protection: None   Other Topics Concern  . Not on file   Social History Narrative    Review of Systems  Eyes: Positive for blurred vision.  Genitourinary: Positive for frequency.  Neurological: Positive for tingling.  Endo/Heme/Allergies: Positive for polydipsia.       Objective:   Filed Vitals:   05/16/14 1240  BP: 129/85  Pulse: 99  Temp: 97.9 F (36.6 C)  Resp: 15    Physical Exam: Constitutional: Patient appears well-developed and well-nourished. No distress. HENT: Normocephalic, atraumatic, External right and left ear normal. Oropharynx is clear and moist.  Eyes: Conjunctivae and EOM are normal. PERRLA, no scleral icterus. Neck: Normal ROM. Neck supple. No JVD. No tracheal deviation. No thyromegaly. CVS: RRR, S1/S2 +, no murmurs, no gallops, no carotid bruit.  Pulmonary: Effort and breath sounds normal, no stridor, rhonchi, wheezes, rales.  Abdominal: Soft. BS +,  no distension, tenderness, rebound or guarding.  Musculoskeletal: Normal range of motion. No edema and no tenderness.  Neuro: Alert. Normal reflexes, muscle tone coordination. No cranial nerve deficit. Skin: Skin is warm and dry.  No rash noted. Not diaphoretic. No erythema. No pallor. Psychiatric: Normal mood and affect. Behavior, judgment, thought content normal.  Lab Results  Component Value Date   WBC 8.0 04/17/2014   HGB 11.6* 04/17/2014   HCT 36.2 04/17/2014   MCV 73.9* 04/17/2014   PLT 428* 04/17/2014   Lab Results  Component Value Date   CREATININE 0.71 04/17/2014   BUN 8 04/17/2014   NA 134* 04/17/2014   K 3.7 04/17/2014   CL 100 04/17/2014   CO2 31 04/17/2014    Lab Results  Component Value Date   HGBA1C 9.40 04/20/2014   Lipid Panel  No results found for: CHOL, TRIG, HDL, CHOLHDL, VLDL, LDLCALC     Assessment and plan:   Lisa Cooley was seen today for follow-up.  Diagnoses and all orders for this visit:  Type 2 diabetes mellitus without complication Orders: -     Glucose (CBG) Patient will continue to take Metformin 1,000 mg BID. I have stressed specific diet changes, weight loss, exercise, foot care, eye care, dental care. Patient referred to diabetes education and nutrition  Morbid Obesity, BMI 50-59.9 Weight loss discussed at length and its complications to health.  Patient will loss 10-20 pounds  by next visit in 3 months.  Diet and exercise discussed as well as calorie intake.  >50% of visit was spent counseling and warning patient of complications of diabetes and obesity.   Return for 1 week with diabetes educator and 2 mo with PCP. She will go with John for weekly mentoring and I will re-evaluate A1C in 2 months. If not at goal may add second agent--possibly victoza or byetta??     KECK, VALERIE, NP-C [facility] [phone] 05/16/2014, 1:05 PM   

## 2014-05-16 NOTE — Progress Notes (Signed)
Pt is here following up on her diabetes. Pt has no c.c. Today.

## 2014-05-16 NOTE — Patient Instructions (Signed)
Diabetes and Exercise Exercising regularly is important. It is not just about losing weight. It has many health benefits, such as:  Improving your overall fitness, flexibility, and endurance.  Increasing your bone density.  Helping with weight control.  Decreasing your body fat.  Increasing your muscle strength.  Reducing stress and tension.  Improving your overall health. People with diabetes who exercise gain additional benefits because exercise:  Reduces appetite.  Improves the body's use of blood sugar (glucose).  Helps lower or control blood glucose.  Decreases blood pressure.  Helps control blood lipids (such as cholesterol and triglycerides).  Improves the body's use of the hormone insulin by:  Increasing the body's insulin sensitivity.  Reducing the body's insulin needs.  Decreases the risk for heart disease because exercising:  Lowers cholesterol and triglycerides levels.  Increases the levels of good cholesterol (such as high-density lipoproteins [HDL]) in the body.  Lowers blood glucose levels. YOUR ACTIVITY PLAN  Choose an activity that you enjoy and set realistic goals. Your health care provider or diabetes educator can help you make an activity plan that works for you. Exercise regularly as directed by your health care provider. This includes:  Performing resistance training twice a week such as push-ups, sit-ups, lifting weights, or using resistance bands.  Performing 150 minutes of cardio exercises each week such as walking, running, or playing sports.  Staying active and spending no more than 90 minutes at one time being inactive. Even short bursts of exercise are good for you. Three 10-minute sessions spread throughout the day are just as beneficial as a single 30-minute session. Some exercise ideas include:  Taking the dog for a walk.  Taking the stairs instead of the elevator.  Dancing to your favorite song.  Doing an exercise  video.  Doing your favorite exercise with a friend. RECOMMENDATIONS FOR EXERCISING WITH TYPE 1 OR TYPE 2 DIABETES   Check your blood glucose before exercising. If blood glucose levels are greater than 240 mg/dL, check for urine ketones. Do not exercise if ketones are present.  Avoid injecting insulin into areas of the body that are going to be exercised. For example, avoid injecting insulin into:  The arms when playing tennis.  The legs when jogging.  Keep a record of:  Food intake before and after you exercise.  Expected peak times of insulin action.  Blood glucose levels before and after you exercise.  The type and amount of exercise you have done.  Review your records with your health care provider. Your health care provider will help you to develop guidelines for adjusting food intake and insulin amounts before and after exercising.  If you take insulin or oral hypoglycemic agents, watch for signs and symptoms of hypoglycemia. They include:  Dizziness.  Shaking.  Sweating.  Chills.  Confusion.  Drink plenty of water while you exercise to prevent dehydration or heat stroke. Body water is lost during exercise and must be replaced.  Talk to your health care provider before starting an exercise program to make sure it is safe for you. Remember, almost any type of activity is better than none. Document Released: 04/11/2003 Document Revised: 06/05/2013 Document Reviewed: 06/28/2012 ExitCare Patient Information 2015 [employer]. This information is not intended to replace advice given to you by your health care provider. Make sure you discuss any questions you have with your health care provider.  

## 2014-06-14 ENCOUNTER — Ambulatory Visit: Payer: Self-pay

## 2014-06-21 ENCOUNTER — Ambulatory Visit: Payer: Self-pay

## 2014-06-28 ENCOUNTER — Ambulatory Visit: Payer: Self-pay

## 2015-03-12 ENCOUNTER — Emergency Department (HOSPITAL_COMMUNITY): Payer: Self-pay

## 2015-03-12 ENCOUNTER — Emergency Department (HOSPITAL_COMMUNITY)
Admission: EM | Admit: 2015-03-12 | Discharge: 2015-03-12 | Disposition: A | Discharge status: Home / Self Care | Payer: Self-pay | Attending: Emergency Medicine | Admitting: Emergency Medicine

## 2015-03-12 ENCOUNTER — Encounter (HOSPITAL_COMMUNITY): Payer: Self-pay | Admitting: Cardiology

## 2015-03-12 DIAGNOSIS — S3992XA Unspecified injury of lower back, initial encounter: Secondary | ICD-10-CM | POA: Insufficient documentation

## 2015-03-12 DIAGNOSIS — M25561 Pain in right knee: Secondary | ICD-10-CM

## 2015-03-12 DIAGNOSIS — N611 Abscess of the breast and nipple: Secondary | ICD-10-CM | POA: Insufficient documentation

## 2015-03-12 DIAGNOSIS — E119 Type 2 diabetes mellitus without complications: Secondary | ICD-10-CM | POA: Insufficient documentation

## 2015-03-12 DIAGNOSIS — R079 Chest pain, unspecified: Secondary | ICD-10-CM

## 2015-03-12 DIAGNOSIS — R197 Diarrhea, unspecified: Secondary | ICD-10-CM | POA: Insufficient documentation

## 2015-03-12 DIAGNOSIS — R0602 Shortness of breath: Secondary | ICD-10-CM | POA: Insufficient documentation

## 2015-03-12 DIAGNOSIS — R350 Frequency of micturition: Secondary | ICD-10-CM | POA: Insufficient documentation

## 2015-03-12 DIAGNOSIS — Z87891 Personal history of nicotine dependence: Secondary | ICD-10-CM | POA: Insufficient documentation

## 2015-03-12 DIAGNOSIS — Y998 Other external cause status: Secondary | ICD-10-CM | POA: Insufficient documentation

## 2015-03-12 DIAGNOSIS — S29001A Unspecified injury of muscle and tendon of front wall of thorax, initial encounter: Secondary | ICD-10-CM | POA: Insufficient documentation

## 2015-03-12 DIAGNOSIS — S8991XA Unspecified injury of right lower leg, initial encounter: Secondary | ICD-10-CM | POA: Insufficient documentation

## 2015-03-12 DIAGNOSIS — M25562 Pain in left knee: Secondary | ICD-10-CM

## 2015-03-12 DIAGNOSIS — Y92410 Unspecified street and highway as the place of occurrence of the external cause: Secondary | ICD-10-CM | POA: Insufficient documentation

## 2015-03-12 DIAGNOSIS — E669 Obesity, unspecified: Secondary | ICD-10-CM | POA: Insufficient documentation

## 2015-03-12 DIAGNOSIS — Z113 Encounter for screening for infections with a predominantly sexual mode of transmission: Secondary | ICD-10-CM | POA: Insufficient documentation

## 2015-03-12 DIAGNOSIS — S8992XA Unspecified injury of left lower leg, initial encounter: Secondary | ICD-10-CM | POA: Insufficient documentation

## 2015-03-12 DIAGNOSIS — R112 Nausea with vomiting, unspecified: Secondary | ICD-10-CM | POA: Insufficient documentation

## 2015-03-12 DIAGNOSIS — Y9389 Activity, other specified: Secondary | ICD-10-CM | POA: Insufficient documentation

## 2015-03-12 LAB — CBC WITH DIFFERENTIAL/PLATELET
BASOS ABS: 0 10*3/uL (ref 0.0–0.1)
BASOS PCT: 0 %
Eosinophils Absolute: 0.1 10*3/uL (ref 0.0–0.7)
Eosinophils Relative: 1 %
HEMATOCRIT: 39.4 % (ref 36.0–46.0)
HEMOGLOBIN: 12.6 g/dL (ref 12.0–15.0)
LYMPHS PCT: 38 %
Lymphs Abs: 3.2 10*3/uL (ref 0.7–4.0)
MCH: 23.7 pg — ABNORMAL LOW (ref 26.0–34.0)
MCHC: 32 g/dL (ref 30.0–36.0)
MCV: 74.2 fL — ABNORMAL LOW (ref 78.0–100.0)
MONO ABS: 0.6 10*3/uL (ref 0.1–1.0)
Monocytes Relative: 7 %
NEUTROS PCT: 54 %
Neutro Abs: 4.6 10*3/uL (ref 1.7–7.7)
Platelets: 468 10*3/uL — ABNORMAL HIGH (ref 150–400)
RBC: 5.31 MIL/uL — AB (ref 3.87–5.11)
RDW: 16.9 % — AB (ref 11.5–15.5)
WBC: 8.4 10*3/uL (ref 4.0–10.5)

## 2015-03-12 LAB — URINE MICROSCOPIC-ADD ON: Bacteria, UA: NONE SEEN

## 2015-03-12 LAB — URINALYSIS, ROUTINE W REFLEX MICROSCOPIC
Bilirubin Urine: NEGATIVE
Glucose, UA: NEGATIVE mg/dL
Hgb urine dipstick: NEGATIVE
KETONES UR: NEGATIVE mg/dL
NITRITE: NEGATIVE
PROTEIN: NEGATIVE mg/dL
Specific Gravity, Urine: 1.017 (ref 1.005–1.030)
pH: 7 (ref 5.0–8.0)

## 2015-03-12 LAB — BASIC METABOLIC PANEL
ANION GAP: 11 (ref 5–15)
BUN: 7 mg/dL (ref 6–20)
CALCIUM: 9.4 mg/dL (ref 8.9–10.3)
CO2: 25 mmol/L (ref 22–32)
Chloride: 101 mmol/L (ref 101–111)
Creatinine, Ser: 0.63 mg/dL (ref 0.44–1.00)
GFR calc non Af Amer: >60 mL/min (ref >60)
Glucose, Bld: 163 mg/dL — ABNORMAL HIGH (ref 65–99)
POTASSIUM: 3.9 mmol/L (ref 3.5–5.1)
Sodium: 137 mmol/L (ref 135–145)

## 2015-03-12 LAB — WET PREP, GENITAL
CLUE CELLS WET PREP: NONE SEEN
Sperm: NONE SEEN
Trich, Wet Prep: NONE SEEN

## 2015-03-12 LAB — I-STAT TROPONIN, ED: TROPONIN I, POC: 0 ng/mL (ref 0.00–0.08)

## 2015-03-12 LAB — POC URINE PREG, ED: PREG TEST UR: NEGATIVE

## 2015-03-12 MED ORDER — IBUPROFEN 800 MG PO TABS
800.0000 mg | ORAL_TABLET | Freq: Once | ORAL | Status: AC
  Administered 2015-03-12: 800 mg via ORAL
  Filled 2015-03-12: qty 1

## 2015-03-12 MED ORDER — IBUPROFEN 800 MG PO TABS: 800.0000 mg | ORAL_TABLET | Freq: Three times a day (TID) | ORAL | Status: DC

## 2015-03-12 MED ORDER — FLUCONAZOLE 150 MG PO TABS: 150.0000 mg | ORAL_TABLET | Freq: Every day | ORAL | Status: DC

## 2015-03-12 MED ORDER — SULFAMETHOXAZOLE-TRIMETHOPRIM 800-160 MG PO TABS: 1.0000 | ORAL_TABLET | Freq: Two times a day (BID) | ORAL | Status: AC
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
## 2015-03-12 NOTE — Discharge Instructions (Signed)
1. Medications: bactrim for breast abscess, ibuprofen for pain, fluconazole for yeast infection, usual home medications 2. Treatment: rest, drink plenty of fluids 3. Follow Up: you have gonorrhea, chlamydia, syphilis, and HIV test pending and you will be informed if results return positive; please followup with your primary doctor for recheck of the lesion on your left breast; if you do not have a primary care doctor use the resource guide provided to find one; please return to the ER for new or worsening symptoms   Musculoskeletal Pain Musculoskeletal pain is muscle and boney aches and pains. These pains can occur in any part of the body. Your caregiver may treat you without knowing the cause of the pain. They may treat you if blood or urine tests, X-rays, and other tests were normal.  CAUSES There is often not a definite cause or reason for these pains. These pains may be caused by a type of germ (virus). The discomfort may also come from overuse. Overuse includes working out too hard when your body is not fit. Boney aches also come from weather changes. Bone is sensitive to atmospheric pressure changes. HOME CARE INSTRUCTIONS   Ask when your test results will be ready. Make sure you get your test results.  Only take over-the-counter or prescription medicines for pain, discomfort, or fever as directed by your caregiver. If you were given medications for your condition, do not drive, operate machinery or power tools, or sign legal documents for 24 hours. Do not drink alcohol. Do not take sleeping pills or other medications that may interfere with treatment.  Continue all activities unless the activities cause more pain. When the pain lessens, slowly resume normal activities. Gradually increase the intensity and duration of the activities or exercise.  During periods of severe pain, bed rest may be helpful. Lay or sit in any position that is comfortable.  Putting ice on the injured area.  Put  ice in a bag.  Place a towel between your skin and the bag.  Leave the ice on for 15 to 20 minutes, 3 to 4 times a day.  Follow up with your caregiver for continued problems and no reason can be found for the pain. If the pain becomes worse or does not go away, it may be necessary to repeat tests or do additional testing. Your caregiver may need to look further for a possible cause. SEEK IMMEDIATE MEDICAL CARE IF:  You have pain that is getting worse and is not relieved by medications.  You develop chest pain that is associated with shortness or breath, sweating, feeling sick to your stomach (nauseous), or throw up (vomit).  Your pain becomes localized to the abdomen.  You develop any new symptoms that seem different or that concern you. MAKE SURE YOU:   Understand these instructions.  Will watch your condition.  Will get help right away if you are not doing well or get worse.   This information is not intended to replace advice given to you by your health care provider. Make sure you discuss any questions you have with your health care provider.   Document Released: 01/19/2005 Document Revised: 04/13/2011 Document Reviewed: 09/23/2012 Elsevier Interactive Patient Education 2016 Elsevier Inc.  Nonspecific Chest Pain  Chest pain can be caused by many different conditions. There is always a chance that your pain could be related to something serious, such as a heart attack or a blood clot in your lungs. Chest pain can also be caused by conditions that are  not life-threatening. If you have chest pain, it is very important to follow up with your health care provider. CAUSES  Chest pain can be caused by:  Heartburn.  Pneumonia or bronchitis.  Anxiety or stress.  Inflammation around your heart (pericarditis) or lung (pleuritis or pleurisy).  A blood clot in your lung.  A collapsed lung (pneumothorax). It can develop suddenly on its own (spontaneous pneumothorax) or from trauma  to the chest.  Shingles infection (varicella-zoster virus).  Heart attack.  Damage to the bones, muscles, and cartilage that make up your chest wall. This can include:  Bruised bones due to injury.  Strained muscles or cartilage due to frequent or repeated coughing or overwork.  Fracture to one or more ribs.  Sore cartilage due to inflammation (costochondritis). RISK FACTORS  Risk factors for chest pain may include:  Activities that increase your risk for trauma or injury to your chest.  Respiratory infections or conditions that cause frequent coughing.  Medical conditions or overeating that can cause heartburn.  Heart disease or family history of heart disease.  Conditions or health behaviors that increase your risk of developing a blood clot.  Having had chicken pox (varicella zoster). SIGNS AND SYMPTOMS Chest pain can feel like:  Burning or tingling on the surface of your chest or deep in your chest.  Crushing, pressure, aching, or squeezing pain.  Dull or sharp pain that is worse when you move, cough, or take a deep breath.  Pain that is also felt in your back, neck, shoulder, or arm, or pain that spreads to any of these areas. Your chest pain may come and go, or it may stay constant. DIAGNOSIS Lab tests or other studies may be needed to find the cause of your pain. Your health care provider may have you take a test called an ambulatory ECG (electrocardiogram). An ECG records your heartbeat patterns at the time the test is performed. You may also have other tests, such as:  Transthoracic echocardiogram (TTE). During echocardiography, sound waves are used to create a picture of all of the heart structures and to look at how blood flows through your heart.  Transesophageal echocardiogram (TEE).This is a more advanced imaging test that obtains images from inside your body. It allows your health care provider to see your heart in finer detail.  Cardiac monitoring. This  allows your health care provider to monitor your heart rate and rhythm in real time.  Holter monitor. This is a portable device that records your heartbeat and can help to diagnose abnormal heartbeats. It allows your health care provider to track your heart activity for several days, if needed.  Stress tests. These can be done through exercise or by taking medicine that makes your heart beat more quickly.  Blood tests.  Imaging tests. TREATMENT  Your treatment depends on what is causing your chest pain. Treatment may include:  Medicines. These may include:  Acid blockers for heartburn.  Anti-inflammatory medicine.  Pain medicine for inflammatory conditions.  Antibiotic medicine, if an infection is present.  Medicines to dissolve blood clots.  Medicines to treat coronary artery disease.  Supportive care for conditions that do not require medicines. This may include:  Resting.  Applying heat or cold packs to injured areas.  Limiting activities until pain decreases. HOME CARE INSTRUCTIONS  If you were prescribed an antibiotic medicine, finish it all even if you start to feel better.  Avoid any activities that bring on chest pain.  Do not use any tobacco  products, including cigarettes, chewing tobacco, or electronic cigarettes. If you need help quitting, ask your health care provider.  Do not drink alcohol.  Take medicines only as directed by your health care provider.  Keep all follow-up visits as directed by your health care provider. This is important. This includes any further testing if your chest pain does not go away.  If heartburn is the cause for your chest pain, you may be told to keep your head raised (elevated) while sleeping. This reduces the chance that acid will go from your stomach into your esophagus.  Make lifestyle changes as directed by your health care provider. These may include:  Getting regular exercise. Ask your health care provider to suggest  some activities that are safe for you.  Eating a heart-healthy diet. A registered dietitian can help you to learn healthy eating options.  Maintaining a healthy weight.  Managing diabetes, if necessary.  Reducing stress. SEEK MEDICAL CARE IF:  Your chest pain does not go away after treatment.  You have a rash with blisters on your chest.  You have a fever. SEEK IMMEDIATE MEDICAL CARE IF:   Your chest pain is worse.  You have an increasing cough, or you cough up blood.  You have severe abdominal pain.  You have severe weakness.  You faint.  You have chills.  You have sudden, unexplained chest discomfort.  You have sudden, unexplained discomfort in your arms, back, neck, or jaw.  You have shortness of breath at any time.  You suddenly start to sweat, or your skin gets clammy.  You feel nauseous or you vomit.  You suddenly feel light-headed or dizzy.  Your heart begins to beat quickly, or it feels like it is skipping beats. These symptoms may represent a serious problem that is an emergency. Do not wait to see if the symptoms will go away. Get medical help right away. Call your local emergency services (911 in the [place] Do not drive yourself to the hospital.   This information is not intended to replace advice given to you by your health care provider. Make sure you discuss any questions you have with your health care provider.   Document Released: 10/29/2004 Document Revised: 02/09/2014 Document Reviewed: 08/25/2013 Elsevier Interactive Patient Education 2016 [employer].   Emergency Department Resource Guide 1) Find a Doctor and Pay Out of Pocket Although you won't have to find out who is covered by your insurance plan, it is a good idea to ask around and get recommendations. You will then need to call the office and see if the doctor you have chosen will accept you as a new patient and what types of options they offer for patients who are self-pay. Some doctors  offer discounts or will set up payment plans for their patients who do not have insurance, but you will need to ask so you aren't surprised when you get to your appointment.  2) Contact Your Local Health Department Not all health departments have doctors that can see patients for sick visits, but many do, so it is worth a call to see if yours does. If you don't know where your local health department is, you can check in your phone book. The CDC also has a tool to help you locate your state's health department, and many state websites also have listings of all of their local health departments.  3) Find a Walk-in Clinic If your illness is not likely to be very severe or complicated, you may  want to try a walk in clinic. These are popping up all over the country in pharmacies, drugstores, and shopping centers. They're usually staffed by nurse practitioners or physician assistants that have been trained to treat common illnesses and complaints. They're usually fairly quick and inexpensive. However, if you have serious medical issues or chronic medical problems, these are probably not your best option.  No Primary Care Doctor: - Call Health Connect at  [phone] - they can help you locate a primary care doctor that  accepts your insurance, provides certain services, etc. - Physician Referral Service- [phone]  Chronic Pain Problems: Organization         Address  Phone   Notes  [facility]  [phone] Patients need to be referred by their primary care doctor.   Medication Assistance: Organization         Address  Phone   Notes  [facility] Medication [facility] [address] [place] [place], [place] [address] [phone] --Must be a resident of [facility] -- Must have NO insurance coverage whatsoever (no Medicaid/ Medicare, etc.) -- The pt. MUST have a primary care doctor that directs their care regularly and follows them in the community    [facility]  [phone]   [employer]  [phone]    Agencies that provide inexpensive medical care: Organization         Address  Phone   Notes  [facility]  [phone]   [facility]    [phone]   [facility] [address] [address] [place], [place] [address] [phone]   [facility] [address] [place], [place] [phone]   Planned Parenthood    [phone]   [facility]    [phone]   [facility] and [facility]  [address], [place] Phone:  [phone], Fax:  [phone] Hours of Operation:  9 am - 6 pm, M-F.  Also accepts Medicaid/Medicare and self-pay.  [facility]  [address], [place] Phone: [phone], Fax: [phone]. Hours of Operation:  8:30 am - 5:30 pm, M-F.  Also accepts Medicaid and self-pay.  [facility] [place] [address], [place] Phone: [phone]   [facility] [address] Natasha Bence [place], [place] [phone], Ext. [phone] Mondays & Thursdays: 7-9 AM.  First 15 patients are seen on a first come, first serve basis.    Medicaid-accepting [facility] Providers:  Organization         Address  Phone   Notes  [facility] [address], [place] [phone] Also accepts self-pay patients.  [facility] [address] Laurell Josephs [place], [place]  [phone]   [facility] [address], [place] [phone]   [facility] [address], [place] [phone]   Renaye Rakers [address], [place]   [phone] Only accepts [place] Access [place] patients after they have their name applied to their card.   Self-Pay (no insurance) in [facility]:  Organization         Address  Phone   Notes  [facility]ick[facility] Patients, [facility] [address] [place], [place] [phone]   [facility] [address] [place], [place] [phone])  [phone]   [facility] [place]  [address], [place] [phone]   [facility]/Dr. Osei-Bonsu  [address] [place] or [address], [place] [phone] Phone number for both [place] and [place] locations is the same.  [facility]gent Medical and [facility] [address] [place] [phone]   [facility] [address] [place] or [address] [phone]   [facility] [address] [place] [phone], phone; [phone], fax Sees patients 1st and 3rd Saturday of every month.  Must not qualify for public or private insurance (i.e. Medicaid, Medicare, [place] Health Choice, Veterans' Benefits)  Household income should be no more than 200% of the poverty level The clinic cannot treat you if you are pregnant or think you are pregnant  Sexually transmitted diseases are not treated at the clinic.    Dental Care: Organization         Address  Phone  Notes  [facility] Department of [facility] [address] [place], [place] [phone] Accepts children up to age 61 who are enrolled in [place] or [place] Health Choice; pregnant women with a Medicaid card; and children who have applied for Medicaid or [place] Health Choice, but were declined, whose parents can pay a reduced fee at time of service.  [facility] Department of [facility]  [address], [place] [phone] Accepts children up to age 13 who are enrolled in [place] or [place] Health Choice; pregnant women with a Medicaid card; and children who have applied for Medicaid or [place] Health Choice, but were declined, whose parents can pay a reduced fee at time of service.  [facility]  [address]  [place], [place] [phone] Patients are seen by appointment only. Walk-ins are not accepted. [facility] will see patients 39 years of age and older. Monday - Tuesday (8am-5pm) Most Wednesdays (8:30-5pm) $30 per visit, cash only  [facility]  [address], [facility] [phone] Patients are seen by appointment only. Walk-ins are not accepted. [facility] will see patients 92 years of age and older. One Wednesday Evening (Monthly: Volunteer Based).  $30 per visit, cash only  [employer]  [phone] for adults; Children under age 61, call Graduate Pediatric Dentistry at [phone]. Children aged 61-14, please call [phone] to request a pediatric application.  Dental services are provided in all areas of dental care including fillings, crowns and bridges, complete and partial dentures, implants, gum treatment, root canals, and extractions. Preventive care is also provided. Treatment is provided to both adults and children. Patients are selected via a lottery and there is often a waiting list.   [facility] [address], [place]  [phone] [email or web address]   [facility] [address] [place], [place] [phone], Ext. [phone] Second and Fourth Thursday of each month, opens at 6:30 AM; Clinic ends at 9 AM.  Patients are seen on a first-come first-served basis, and a limited number are seen during each clinic.   [facility]  [address] Ether Griffins [place], [place] [phone]   Eligibility Requirements You must have lived in [place], [place], or [place] counties for at least the last three months.   You cannot be eligible for state or  Teacher, music, including [employer], [place], or [employer].   You generally cannot be eligible for healthcare insurance through your employer.    How to apply: Eligibility screenings are held every Tuesday and  Wednesday afternoon from 1:00 pm until 4:00 pm. You do not need an appointment for the interview!  [facility] [address] [place], [place] [phone]   [facility] Health Department  [phone]   [facility] Health Department  [phone]   [facility] Health Department  [phone]    Behavioral Health Resources in the Community: Intensive Outpatient Programs Organization         Address  Phone  Notes  [facility] Services [address] [place], [place] [phone]   [facility] Outpatient [address], [place], [place] [phone]   [facility] [address], [place], [place]  [phone]   [facility] Mental Health [address]  [place], [place] [phone] or [phone]   Substance Abuse Resources Organization         Address  Phone  Notes  [facility]  [phone]   [facility]  [phone]   [facility]  [phone]   Floydene Flock  [phone]   Residential & Outpatient Substance Abuse Program  [phone]   Psychological Services Organization         Address  Phone  Notes  [facility] Behavioral Health  [phone]   [facility] Services  [phone]   [facility] Mental Health [address] [place] [phone] or [phone]    Mobile Crisis Teams Organization         Address  Phone  Notes  [facility], Mobile Crisis Care Unit  [phone]   [facility]  [address] [place], [place] [phone]   Doristine Locks [address] [place] [phone]    Self-Help/Support Groups Organization         Address  Phone             Notes  [facility] [place] - variety of support groups  336- I7437963 Call for more information  Narcotics Anonymous (NA), [facility] [address], [employer] [place]  2 meetings at this location   Risk manager         Address  Phone  Notes  [facility] [address],    [place]  [phone]   [facility]  [address] [place] [address], [place], [place] [phone]   [facility] [address] [place], [place] [phone] Admissions: 8am-3pm M-F  [facility] [address]    [place], [place] [phone]   [facility] [address] Starling Manns [place], [place] [phone]   [facility] [address]  [place], [place] [phone]   [facility] - Intensive Outpatient [address] Laurell Josephs [address], [place], [place] [phone]   [facility] (Addiction Recovery [facility] [address] [place]  [place], [place] [phone] or [phone]   [facility] ([facility]) [address], [place], [place] [phone] Accepts Medicaid  [facility] [address],  [place] [phone] Substance Abuse/Addiction Treatment   [facility] Organization         Address  Phone  Notes  [facility]  [phone]   Angie Fava, PhD [address] [place], [place]   (  336) X3202989 or [phone]) [phone]   [facility]   [address] [place], [place] [phone]   [facility] Recovery [address] [place], [place] [phone] Insurance/Medicaid/sponsorship through [facility] Families [address]                                    [place], [place] [phone] Therapy/tele-psych/case  [facility] [address]   [place], [place] [phone]    Dr. Lolly Mustache  [phone]   [facility]  [employer] [facility] Dept. 1) [address]  2) [address] [place] 3)  [address], [place] [phone]  [phone]   [facility] Child Abuse Hotline (425)634-9350 or [phone] (After Hours)
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
## 2015-03-13 LAB — GC/CHLAMYDIA PROBE AMP (~~LOC~~) NOT AT ARMC
Chlamydia: NEGATIVE
Neisseria Gonorrhea: NEGATIVE

## 2015-03-13 LAB — RPR: RPR Ser Ql: NONREACTIVE

## 2015-03-13 LAB — HIV ANTIBODY (ROUTINE TESTING W REFLEX): HIV SCREEN 4TH GENERATION: NONREACTIVE

## 2015-06-18 ENCOUNTER — Encounter (HOSPITAL_COMMUNITY): Payer: Self-pay | Admitting: *Deleted

## 2015-06-18 ENCOUNTER — Inpatient Hospital Stay (HOSPITAL_COMMUNITY)
Admission: AD | Admit: 2015-06-18 | Discharge: 2015-06-18 | Disposition: A | Discharge status: Home / Self Care | Payer: No Typology Code available for payment source | Source: Ambulatory Visit | Attending: Obstetrics & Gynecology | Admitting: Obstetrics & Gynecology

## 2015-06-18 DIAGNOSIS — R1030 Lower abdominal pain, unspecified: Secondary | ICD-10-CM

## 2015-06-18 DIAGNOSIS — Z7984 Long term (current) use of oral hypoglycemic drugs: Secondary | ICD-10-CM | POA: Insufficient documentation

## 2015-06-18 DIAGNOSIS — R3915 Urgency of urination: Secondary | ICD-10-CM | POA: Insufficient documentation

## 2015-06-18 DIAGNOSIS — R350 Frequency of micturition: Secondary | ICD-10-CM | POA: Insufficient documentation

## 2015-06-18 DIAGNOSIS — Z87891 Personal history of nicotine dependence: Secondary | ICD-10-CM | POA: Insufficient documentation

## 2015-06-18 DIAGNOSIS — E119 Type 2 diabetes mellitus without complications: Secondary | ICD-10-CM | POA: Insufficient documentation

## 2015-06-18 LAB — POCT PREGNANCY, URINE: PREG TEST UR: NEGATIVE

## 2015-06-18 LAB — URINALYSIS, ROUTINE W REFLEX MICROSCOPIC
Bilirubin Urine: NEGATIVE
Glucose, UA: NEGATIVE mg/dL
Hgb urine dipstick: NEGATIVE
Ketones, ur: NEGATIVE mg/dL
Leukocytes, UA: NEGATIVE
Nitrite: NEGATIVE
Protein, ur: NEGATIVE mg/dL
Specific Gravity, Urine: 1.015 (ref 1.005–1.030)
pH: 7 (ref 5.0–8.0)

## 2015-06-18 LAB — WET PREP, GENITAL
Clue Cells Wet Prep HPF POC: NONE SEEN
Sperm: NONE SEEN
Trich, Wet Prep: NONE SEEN
Yeast Wet Prep HPF POC: NONE SEEN

## 2015-06-18 LAB — GLUCOSE, CAPILLARY: Glucose-Capillary: 157 mg/dL — ABNORMAL HIGH (ref 65–99)

## 2015-06-18 NOTE — MAU Note (Signed)
Urine in lab 

## 2015-06-18 NOTE — MAU Note (Signed)
Pt states that she has trouble emptying her bladder. No burning or irritation but cannot empty.  Also states that she has pain on her chest felt like it was swollen last week but does not have that symptom today said "she may as well get it checked out while shes here".  She also states she gets out of breath sometimes at work.  Denies VB/discharge.

## 2015-06-18 NOTE — MAU Provider Note (Signed)
History     CSN: [id number]  Arrival date and time: 06/18/15 1037   First Provider Initiated Contact with Patient 06/18/15 1137      Chief Complaint  Patient presents with  . Urinary Tract Infection   HPI   Lisa Cooley 30 y.o. G0P0000 presents to the MAU stating that she thinks she has a UTI. She has a history of recurrent UTI's. She states she is having mild lower abdominal pain. She has a history of being non compliant with her diabetes. She does not have anyone managing her diabetes.  Past Medical History  Diagnosis Date  . Diabetes mellitus     Dx in [place] but not on meds  . Headache(784.0)   . Obese     Past Surgical History  Procedure Laterality Date  . No past surgeries      Family History  Problem Relation Age of Onset  . Other Neg Hx   . Cancer Father   . Heart disease Sister     Social History  Substance Use Topics  . Smoking status: Former Smoker -- 0.25 packs/day    Types: Cigarettes    Quit date: 04/20/2014  . Smokeless tobacco: Never Used  . Alcohol Use: No    Allergies: No Known Allergies  Prescriptions prior to admission  Medication Sig Dispense Refill Last Dose  . Aspirin-Acetaminophen-Caffeine (PAMPRIN MAX PO) Take 1-2 tablets by mouth daily as needed (mentrual cramps). Reported on 03/12/2015   Not Taking at Unknown time  . Blood Glucose Monitoring Suppl (ACCU-CHEK AVIVA PLUS) W/DEVICE KIT 1 kit by Does not apply route 3 (three) times daily. Check BS 3 times a day (Patient not taking: Reported on 03/12/2015) 1 kit 0 Not Taking at Unknown time  . ciprofloxacin (CIPRO) 500 MG tablet Take 1 tablet (500 mg total) by mouth 2 (two) times daily. (Patient not taking: Reported on 03/12/2015) 28 tablet 0 Completed Course at Unknown time  . dextromethorphan-guaiFENesin (MUCINEX DM) 30-600 MG per 12 hr tablet Take 1 tablet by mouth 2 (two) times daily as needed for cough. (Patient not taking: Reported on 04/17/2014) 20 tablet 0 Not Taking at Unknown  time  . fluconazole (DIFLUCAN) 150 MG tablet Take 1 tablet (150 mg total) by mouth daily. 1 tablet 0   . glucose blood (ACCU-CHEK AVIVA) test strip Use as instructed (Patient not taking: Reported on 03/12/2015) 100 each 12 Not Taking at Unknown time  . ibuprofen (ADVIL,MOTRIN) 800 MG tablet Take 1 tablet (800 mg total) by mouth 3 (three) times daily. 21 tablet 0   . Lancets (ACCU-CHEK SOFT TOUCH) lancets Use as instructed (Patient not taking: Reported on 03/12/2015) 100 each 12 Not Taking at Unknown time  . metFORMIN (GLUCOPHAGE) 1000 MG tablet Take 1 tablet (1,000 mg total) by mouth 2 (two) times daily with a meal. (Patient not taking: Reported on 03/12/2015) 180 tablet 3 Not Taking at Unknown time  . ondansetron (ZOFRAN) 4 MG tablet Take 1 tablet (4 mg total) by mouth every 6 (six) hours. (Patient not taking: Reported on 03/12/2015) 12 tablet 0 Completed Course at Unknown time  . oxyCODONE-acetaminophen (PERCOCET/ROXICET) 5-325 MG per tablet Take 1-2 tablets by mouth every 6 (six) hours as needed. (Patient not taking: Reported on 03/12/2015) 20 tablet 0 Not Taking at Unknown time  . traMADol (ULTRAM) 50 MG tablet Take 1 tablet (50 mg total) by mouth every 8 (eight) hours as needed. (Patient not taking: Reported on 03/12/2015) 30 tablet 0 Completed Course at Unknown  time    Review of Systems  Constitutional: Negative for fever.  Gastrointestinal: Positive for abdominal pain.  Genitourinary: Positive for urgency and frequency. Negative for flank pain.  All other systems reviewed and are negative.  Physical Exam   Blood pressure 128/88, pulse 89, temperature 97.7 F (36.5 C), temperature source Oral, resp. rate 17, height 5' 7"  (1.702 m), weight 350 lb (158.759 kg), last menstrual period 05/27/2015, SpO2 100 %.  Physical Exam  Nursing note and vitals reviewed. Constitutional: She is oriented to person, place, and time. She appears well-developed and well-nourished. No distress.  HENT:  Head:  Normocephalic and atraumatic.  Cardiovascular: Normal rate and regular rhythm.   Respiratory: Effort normal and breath sounds normal.  GI:  Habitus- 350 pounds; large abdominal girth  Genitourinary: Vagina normal. No vaginal discharge found.  Pelvic exam normal  Musculoskeletal: Normal range of motion.  Neurological: She is alert and oriented to person, place, and time.  Skin: Skin is warm and dry.  Psychiatric: She has a normal mood and affect. Her behavior is normal. Judgment and thought content normal.   Results for orders placed or performed during the hospital encounter of 06/18/15 (from the past 24 hour(s))  Urinalysis, Routine w reflex microscopic (not at [facility])     Status: None   Collection Time: 06/18/15 10:37 AM  Result Value Ref Range   Color, Urine YELLOW YELLOW   APPearance CLEAR CLEAR   Specific Gravity, Urine 1.015 1.005 - 1.030   pH 7.0 5.0 - 8.0   Glucose, UA NEGATIVE NEGATIVE mg/dL   Hgb urine dipstick NEGATIVE NEGATIVE   Bilirubin Urine NEGATIVE NEGATIVE   Ketones, ur NEGATIVE NEGATIVE mg/dL   Protein, ur NEGATIVE NEGATIVE mg/dL   Nitrite NEGATIVE NEGATIVE   Leukocytes, UA NEGATIVE NEGATIVE  Pregnancy, urine POC     Status: None   Collection Time: 06/18/15 11:33 AM  Result Value Ref Range   Preg Test, Ur NEGATIVE NEGATIVE  Glucose, capillary     Status: Abnormal   Collection Time: 06/18/15 11:46 AM  Result Value Ref Range   Glucose-Capillary 157 (H) 65 - 99 mg/dL  Wet prep, genital     Status: Abnormal   Collection Time: 06/18/15 12:45 PM  Result Value Ref Range   Yeast Wet Prep HPF POC NONE SEEN NONE SEEN   Trich, Wet Prep NONE SEEN NONE SEEN   Clue Cells Wet Prep HPF POC NONE SEEN NONE SEEN   WBC, Wet Prep HPF POC FEW (A) NONE SEEN   Sperm NONE SEEN    MAU Course  Procedures  MDM   Based on lab results, UTI is not likely. I will send urine off for culture. Wet prep is negative. Instructed pt to follow up with primary care Dr. To manage diabetes.    Assessment and Plan  Lower abdominal pain  F/U with PCP of choice to manage diabetes  Discharge  Lisa Cooley 06/18/2015, 12:01 PM

## 2015-06-18 NOTE — MAU Note (Addendum)
Pt has a history of 4 pregnancies in the system, pt denies ever being pregnant. Name and DOB verified again. Previous admission notes reviewed that are consistent with pt never being pregnant before. Ob History of G4P1 deleted,

## 2015-06-18 NOTE — Discharge Instructions (Signed)
Alogliptin; Metformin oral tablets What is this medicine? ALOGLIPTIN; METFORMIN (al oh GLIP tin; met FOR min) is a combination of 2 medicines used to treat type 2 diabetes. This medicine lowers blood sugar. Treatment is combined with a balanced diet and exercise. This medicine may be used for other purposes; ask your health care provider or pharmacist if you have questions. What should I tell my health care provider before I take this medicine? They need to know if you have any of these conditions: -anemia -become easily dehydrated -diabetic ketoacidosis -heart disease -heart failure -history of alcohol abuse problem -if you often drink alcohol -kidney disease -liver disease -low levels of vitamin B12 in the blood -older than 80 years -pancreatitis -polycystic ovary syndrome -previous swelling of the tongue, face, or lips with difficulty breathing, difficulty swallowing, hoarseness, or tightening of the throat -serious infection or injury -thyroid disease -type 1 diabetes -undergoing surgery or certain procedures with injectable contrast agents -an unusual or allergic reaction to alogliptin, metformin, other medicines, foods, dyes, or preservatives -pregnant or trying to get pregnant -breast-feeding How should I use this medicine? Take this medicine by mouth with a glass of water. Take this medicine with food. Do not cut this medicine. Follow the directions on the prescription label. Take your doses at regular intervals. Do not take your medicine more often than directed. Do not stop taking except on your doctor's advice. Talk to your pediatrician regarding the use of this medicine in children. Special care may be needed. Overdosage: If you think you have taken too much of this medicine contact a poison control center or emergency room at once. NOTE: This medicine is only for you. Do not share this medicine with others. What if I miss a dose? If you miss a dose, take it as soon as  you can. If it is almost time for your next dose, take only that dose. Do not take double or extra doses. What may interact with this medicine? Do not take this medicine with any of the following medications: -certain contrast medicines given before X-rays, CT scans, MRI, or other procedures -dofetilide -gatifloxacin This medicine may also interact with the following medications: -acetazolamide -alcohol -amiloride -certain antiviral medicines for HIV infection or hepatitis -cimetidine -crizotinib -digoxin -diuretics -female hormones, like estrogens or progestins and birth control pills -glycopyrrolate -isoniazid -lamotrigine -medicines for blood pressure, heart disease, irregular heart beat -memantine -methazolamide -midodrine -morphine -nicotinic acid -phenothiazines like chlorpromazine, mesoridazine, prochlorperazine, thioridazine -phenytoin -procainamide -propantheline -quinidine -quinine -ranitidine -ranolazine -steroid medicines like prednisone or cortisone -stimulant medicines for attention disorders, weight loss, or to stay awake -thyroid medicines -topiramate -trimethoprim -trospium -vancomycin -vandetanib -zonisamide This list may not describe all possible interactions. Give your health care provider a list of all the medicines, herbs, non-prescription drugs, or dietary supplements you use. Also tell them if you smoke, drink alcohol, or use illegal drugs. Some items may interact with your medicine. What should I watch for while using this medicine? Visit your doctor or health care professional for regular checks on your progress. A test called the HbA1C (A1C) will be monitored. This is a simple blood test. It measures your blood sugar control over the last 2 to 3 months. You will receive this test every 3 to 6 months. Learn how to check your blood sugar. Learn the symptoms of low and high blood sugar and how to manage them. Always carry a quick-source of sugar  with you in case you have symptoms of low blood sugar. Examples  include hard sugar candy or glucose tablets. Make sure others know that you can choke if you eat or drink when you develop serious symptoms of low blood sugar, such as seizures or unconsciousness. They must get medical help at once. Tell your doctor or health care professional if you have high blood sugar. You might need to change the dose of your medicine. If you are sick or exercising more than usual, you might need to change the dose of your medicine. Do not skip meals. Ask your doctor or health care professional if you should avoid alcohol. Many nonprescription cough and cold products contain sugar or alcohol. These can affect blood sugar. This medicine may cause ovulation in premenopausal women who do not have regular monthly periods. This may increase your chances of becoming pregnant. You should not take this medicine if you become pregnant or think you may be pregnant. Talk with your doctor or health care professional about your birth control options while taking this medicine. Contact your doctor or health care professional right away if think you are pregnant. If you are going to need surgery, a MRI, CT scan, or other procedure, tell your doctor that you are taking this medicine. You may need to stop taking this medicine before the procedure. Wear a medical ID bracelet or chain, and carry a card that describes your disease and details of your medicine and dosage times. What side effects may I notice from receiving this medicine? Side effects that you should report to your doctor or health care professional as soon as possible: -allergic reactions like skin rash, itching or hives, swelling of the face, lips, or tongue -breathing problems -dark urine -general ill feeling or flu-like symptoms -joint pain -light-colored stools -loss of appetite -muscle pain -nausea, vomiting -right upper belly pain -signs and symptoms of low  blood sugar such as feeling anxious, confusion, dizziness, increased hunger, unusually weak or tired, sweating, shakiness, cold, irritable, headache, blurred vision, fast heartbeat, loss of consciousness -slow or irregular heartbeat -unusual stomach upset or pain -yellowing of the eyes or skin Side effects that usually do not require medical attention (Report these to your doctor or health care professional if they continue or are bothersome.): -diarrhea -headache -heartburn -metallic taste in the mouth -stomach gas, upset -stuffy or runny nose This list may not describe all possible side effects. Call your doctor for medical advice about side effects. You may report side effects to FDA at 1-800-FDA-1088. Where should I keep my medicine? Keep out of the reach of children. Store at room temperature between 20 and 25 degrees C (68 and 77 degrees F). Throw away any unused medicine after the expiration date. NOTE: This sheet is a summary. It may not cover all possible information. If you have questions about this medicine, talk to your doctor, pharmacist, or health care provider.    2016, [employer]vier/Gold Standard. (2014-05-10 10:33:18)

## 2015-06-18 NOTE — MAU Note (Signed)
Pt states she is has urinary urgency but unable to empty bladder well. Pt also reports pelvic pain yesterday but not today. Pt has a hx of UTIs but states that this feels different. Pt also reports external pain on her chest 2 weeks ago, but not presently.

## 2015-06-19 LAB — CULTURE, OB URINE: Special Requests: NORMAL

## 2015-12-21 ENCOUNTER — Ambulatory Visit (HOSPITAL_COMMUNITY)
Admission: EM | Admit: 2015-12-21 | Discharge: 2015-12-21 | Disposition: A | Discharge status: Home / Self Care | Payer: Self-pay | Attending: Family Medicine | Admitting: Family Medicine

## 2015-12-21 ENCOUNTER — Encounter (HOSPITAL_COMMUNITY): Payer: Self-pay | Admitting: *Deleted

## 2015-12-21 DIAGNOSIS — R0789 Other chest pain: Secondary | ICD-10-CM

## 2015-12-21 DIAGNOSIS — R030 Elevated blood-pressure reading, without diagnosis of hypertension: Secondary | ICD-10-CM

## 2015-12-21 DIAGNOSIS — M25561 Pain in right knee: Secondary | ICD-10-CM

## 2015-12-21 DIAGNOSIS — G8929 Other chronic pain: Secondary | ICD-10-CM

## 2015-12-21 DIAGNOSIS — M25562 Pain in left knee: Secondary | ICD-10-CM

## 2015-12-21 DIAGNOSIS — R079 Chest pain, unspecified: Secondary | ICD-10-CM

## 2015-12-21 MED ORDER — NAPROXEN 500 MG PO TABS: 500.0000 mg | ORAL_TABLET | Freq: Two times a day (BID) | ORAL | 0 refills | Status: AC

## 2015-12-21 NOTE — ED Triage Notes (Signed)
Pt  Reports  Pain    In both  Knees        X  Year    Getting   Worse  Over     Last  Month       denys  Any       Recent   Injury   Pt    Reports    Chest  Pain  Off  And   On        With  A  Tightness  In  Chest  As  Well      Pt  Reports pain in  Chest   When  She  Breathes      And  Moves
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
## 2015-12-21 NOTE — Discharge Instructions (Signed)
It was nice seeing you today. I am sorry about your pain. These are mostly due to overuse vs newly developing arthritis. Please see PCP soon for physical therapy referral. I have prescribed Naproxen for pain. Your chest pain is also associated with muscle pain. Use Naproxen as needed. If pain worsens or you are having SOB, please go to the ED.

## 2016-01-01 ENCOUNTER — Inpatient Hospital Stay: Payer: Self-pay

## 2016-01-14 ENCOUNTER — Ambulatory Visit: Payer: Self-pay | Admitting: Internal Medicine

## 2016-02-28 ENCOUNTER — Ambulatory Visit: Payer: Self-pay | Attending: Family Medicine | Admitting: Family Medicine

## 2016-02-28 ENCOUNTER — Encounter: Payer: Self-pay | Admitting: Family Medicine

## 2016-02-28 VITALS — BP 134/81 | HR 111 | Temp 98.6°F | Resp 18 | Ht 67.0 in | Wt 374.8 lb

## 2016-02-28 DIAGNOSIS — I10 Essential (primary) hypertension: Secondary | ICD-10-CM

## 2016-02-28 DIAGNOSIS — Z7984 Long term (current) use of oral hypoglycemic drugs: Secondary | ICD-10-CM | POA: Insufficient documentation

## 2016-02-28 DIAGNOSIS — N946 Dysmenorrhea, unspecified: Secondary | ICD-10-CM

## 2016-02-28 DIAGNOSIS — Z79899 Other long term (current) drug therapy: Secondary | ICD-10-CM | POA: Insufficient documentation

## 2016-02-28 DIAGNOSIS — N920 Excessive and frequent menstruation with regular cycle: Secondary | ICD-10-CM

## 2016-02-28 DIAGNOSIS — N979 Female infertility, unspecified: Secondary | ICD-10-CM

## 2016-02-28 DIAGNOSIS — R1012 Left upper quadrant pain: Secondary | ICD-10-CM

## 2016-02-28 DIAGNOSIS — R51 Headache: Secondary | ICD-10-CM | POA: Insufficient documentation

## 2016-02-28 DIAGNOSIS — E119 Type 2 diabetes mellitus without complications: Secondary | ICD-10-CM

## 2016-02-28 DIAGNOSIS — H43393 Other vitreous opacities, bilateral: Secondary | ICD-10-CM

## 2016-02-28 LAB — CBC WITH DIFFERENTIAL/PLATELET
Basophils Absolute: 0 cells/uL (ref 0–200)
Basophils Relative: 0 %
EOS PCT: 2 %
Eosinophils Absolute: 150 cells/uL (ref 15–500)
HCT: 39.2 % (ref 35.0–45.0)
HEMOGLOBIN: 12.3 g/dL (ref 11.7–15.5)
LYMPHS ABS: 3600 {cells}/uL (ref 850–3900)
Lymphocytes Relative: 48 %
MCH: 22.7 pg — ABNORMAL LOW (ref 27.0–33.0)
MCHC: 31.4 g/dL — AB (ref 32.0–36.0)
MCV: 72.3 fL — ABNORMAL LOW (ref 80.0–100.0)
MPV: 9.3 fL (ref 7.5–12.5)
Monocytes Absolute: 450 cells/uL (ref 200–950)
Monocytes Relative: 6 %
NEUTROS ABS: 3300 {cells}/uL (ref 1500–7800)
NEUTROS PCT: 44 %
PLATELETS: 551 10*3/uL — AB (ref 140–400)
RBC: 5.42 MIL/uL — AB (ref 3.80–5.10)
RDW: 18.1 % — ABNORMAL HIGH (ref 11.0–15.0)
WBC: 7.5 10*3/uL (ref 3.8–10.8)

## 2016-02-28 LAB — TSH: TSH: 1.09 m[IU]/L

## 2016-02-28 LAB — POCT GLYCOSYLATED HEMOGLOBIN (HGB A1C): HEMOGLOBIN A1C: 9.5

## 2016-02-28 LAB — GLUCOSE, POCT (MANUAL RESULT ENTRY): POC Glucose: 180 mg/dl — AB (ref 70–99)

## 2016-02-28 MED ORDER — IBUPROFEN 800 MG PO TABS: 800.0000 mg | ORAL_TABLET | Freq: Three times a day (TID) | ORAL | 0 refills | Status: DC | PRN

## 2016-02-28 MED ORDER — METFORMIN HCL 1000 MG PO TABS: 1000.0000 mg | ORAL_TABLET | Freq: Two times a day (BID) | ORAL | 2 refills | Status: DC

## 2016-02-28 MED ORDER — GLIPIZIDE 5 MG PO TABS: 2.5000 mg | ORAL_TABLET | Freq: Every day | ORAL | 2 refills | Status: DC

## 2016-02-28 MED ORDER — ACCU-CHEK SOFT TOUCH LANCETS MISC: 12 refills | Status: DC

## 2016-02-28 MED ORDER — ACCU-CHEK AVIVA PLUS W/DEVICE KIT: 1.0000 | PACK | Freq: Three times a day (TID) | 0 refills | Status: DC

## 2016-02-28 MED ORDER — HYDROCHLOROTHIAZIDE 12.5 MG PO TABS: 25.0000 mg | ORAL_TABLET | Freq: Every day | ORAL | 2 refills | Status: DC

## 2016-02-28 NOTE — Patient Instructions (Signed)
Type 2 Diabetes Mellitus, Self Care, Adult When you have type 2 diabetes (type 2 diabetes mellitus), you must keep your blood sugar (glucose) under control. You can do this with:  Nutrition.  Exercise.  Lifestyle changes.  Medicines or insulin, if needed.  Support from your doctors and others. How do I manage my blood sugar?  Check your blood sugar level every day, as often as told.  Call your doctor if your blood sugar is above your goal numbers for 2 tests in a row.  Have your A1c (hemoglobin A1c) level checked at least two times a year. Have it checked more often if your doctor tells you to. Your doctor will set treatment goals for you. Generally, you should have these blood sugar levels:  Before meals (preprandial): 80-130 mg/dL (4.4-7.2 mmol/L).  After meals (postprandial): lower than 180 mg/dL (10 mmol/L).  A1c level: less than 7%. What do I need to know about high blood sugar? High blood sugar is called hyperglycemia. Know the signs of high blood sugar. Signs may include:  Feeling:  Thirsty.  Hungry.  Very tired.  Needing to pee (urinate) more than usual.  Blurry vision. What do I need to know about low blood sugar? Low blood sugar is called hypoglycemia. This is when blood sugar is at or below 70 mg/dL (3.9 mmol/L). Symptoms may include:  Feeling:  Hungry.  Worried or nervous (anxious).  Sweaty and clammy.  Confused.  Dizzy.  Sleepy.  Sick to your stomach (nauseous).  Having:  A fast heartbeat (palpitations).  A headache.  A change in your vision.  Jerky movements that you cannot control (seizure).  Nightmares.  Tingling or no feeling (numbness) around the mouth, lips, or tongue.  Having trouble with:  Talking.  Paying attention (concentrating).  Moving (coordination).  Sleeping.  Shaking.  Passing out (fainting).  Getting upset easily (irritability). Treating low blood sugar  To treat low blood sugar, eat or drink  something sugary right away. If you can think clearly and swallow safely, follow the 15:15 rule:  Take 15 grams of a fast-acting carb (carbohydrate). Some fast-acting carbs are:  1 tube of glucose gel.  3 sugar tablets (glucose pills).  6-8 pieces of hard candy.  4 oz (120 mL) of fruit juice.  4 oz (120 mL) regular (not diet) soda.  Check your blood sugar 15 minutes after you take the carb.  If your blood sugar is still at or below 70 mg/dL (3.9 mmol/L), take 15 grams of a carb again.  If your blood sugar does not go above 70 mg/dL (3.9 mmol/L) after 3 tries, get help right away.  After your blood sugar goes back to normal, eat a meal or a snack within 1 hour. Treating very low blood sugar  If your blood sugar is at or below 54 mg/dL (3 mmol/L), you have very low blood sugar (severe hypoglycemia). This is an emergency. Do not wait to see if the symptoms will go away. Get medical help right away. Call your local emergency services (911 in the [place] Do not drive yourself to the hospital. If you have very low blood sugar and you cannot eat or drink, you may need a glucagon shot (injection). A family member or friend should learn how to check your blood sugar and how to give you a glucagon shot. Ask your doctor if you need to have a glucagon shot kit at home. What else is important to manage my diabetes? Medicine  Follow these instructions   about insulin and diabetes medicines:  Take them as told by your doctor.  Adjust them as told by your doctor.  Do not run out of them. Having diabetes can raise your risk for other long-term conditions. These include heart or kidney disease. Your doctor may prescribe medicines to help prevent problems from diabetes. Food   Make healthy food choices. These include:  Chicken, fish, egg whites, and beans.  Oats, whole wheat, bulgur, brown rice, quinoa, and millet.  Fresh fruits and vegetables.  Low-fat dairy products.  Nuts, avocado, olive  oil, and canola oil.  Make a food plan with a specialist (dietitian).  Follow instructions from your doctor about what you cannot eat or drink.  Drink enough fluid to keep your pee (urine) clear or pale yellow.  Eat healthy snacks between healthy meals.  Keep track of carbs that you eat. Read food labels. Learn food serving sizes.  Follow your sick day plan when you cannot eat or drink normally. Make this plan with your doctor so it is ready to use. Activity  Exercise at least 3 times a week.  Do not go more than 2 days without exercising.  Talk with your doctor before you start a new exercise. Your doctor may need to adjust your insulin, medicines, or food. Lifestyle   Do not use any tobacco products. These include cigarettes, chewing tobacco, and e-cigarettes.If you need help quitting, ask your doctor.  Ask your doctor how much alcohol is safe for you.  Learn to deal with stress. If you need help with this, ask your doctor. Body care  Stay up to date with your shots (immunizations).  Have your eyes and feet checked by a doctor as often as told.  Check your skin and feet every day. Check for cuts, bruises, redness, blisters, or sores.  Brush your teeth and gums two times a day.  Floss at least one time a day.  Go to the dentist least one time every 6 months.  Stay at a healthy weight. General instructions   Take over-the-counter and prescription medicines only as told by your doctor.  Share your diabetes care plan with:  Your work or school.  People you live with.  Check your pee (urine) for ketones:  When you are sick.  As told by your doctor.  Carry a card or wear jewelry that says that you have diabetes.  Ask your doctor:  Do I need to meet with a diabetes educator?  Where can I find a support group for people with diabetes?  Keep all follow-up visits as told by your doctor. This is important. Where to find more information: To learn more about  diabetes, visit:  American Diabetes Association: www.diabetes.org  American Association of Diabetes Educators: www.diabeteseducator.org/patient-resources This information is not intended to replace advice given to you by your health care provider. Make sure you discuss any questions you have with your health care provider. Document Released: 05/13/2015 Document Revised: 06/27/2015 Document Reviewed: 02/22/2015 Elsevier Interactive Patient Education  2017 [employer]. Infertility Infertility is when you are unable to get pregnant (conceive) after a year of having sex regularly without using birth control. Infertility can also mean that a woman is not able to carry a pregnancy to full term. Both women and men can have fertility problems. What causes infertility? What Causes Infertility in Women?  There are many possible causes of infertility in women. For some women, the cause of infertility is not known (unexplained infertility). Infertility can also be  linked to more than one cause. Infertility problems in women can be caused by problems with the menstrual cycle or reproductive organs, certain medical conditions, and factors related to lifestyle and age.  Problems with your menstrual cycle can interfere with your ovaries producing eggs (ovulation). This can make it difficult to get pregnant. This includes having a menstrual cycle that is very long, very short, or irregular.  Problems with reproductive organs can include:  An abnormally narrow cervix or a cervix that does not remain closed during a pregnancy.  A blockage in your fallopian tubes.  An abnormally shaped uterus.  Uterine fibroids. This is a tissue mass (tumor) that can develop on your uterus.  Medical conditions that can affect a woman's fertility include:  Polycystic ovarian syndrome (PCOS). This is a hormonal disorder that can cause small cysts to grow on your ovaries. This is the most common cause of infertility in  women.  Endometriosis. This is a condition in which the tissue that lines your uterus (endometrium) grows outside of its normal location.  Primary ovary insufficiency. This is when your ovaries stop producing eggs and hormones before the age of 42.  Sexually transmitted diseases, such as chlamydia or gonorrhea. These infections can cause scarring in your fallopian tubes. This makes it difficult for eggs to reach your uterus.  Autoimmune disorders. These are disorders in which your immune system attacks normal, healthy cells.  Hormone imbalances.  Other factors include:  Age. A woman's fertility declines with age, especially after her mid-29s.  Being under- or overweight.  Drinking too much alcohol.  Using drugs.  Exercising excessively.  Being exposed to environmental toxins, such as radiation, pesticides, and certain chemicals. What Causes Infertility in Men?  There are many causes of infertility in men. Infertility can be linked to more than one cause. Infertility problems in men can be caused by problems with sperm or the reproductive organs, certain medical conditions, and factors related to lifestyle and age. Some men have unexplained infertility.  Problems with sperm. Infertility can result if there is a problem producing:  Enough sperm (low sperm count).  Enough normally-shaped sperm (sperm morphology).  Sperm that are able to reach the egg (poor motility).  Infertility can also be caused by:  A problem with hormones.  Enlarged veins (varicoceles), cysts (spermatoceles), or tumors of the testicles.  Sexual dysfunction.  Injury to the testicles.  A birth defect, such as not having the tubes that carry sperm (vas deferens).  Medical conditions that can affect a man's fertility include:  Diabetes.  Cancer treatments, such as chemotherapy or radiation.  Klinefelter syndrome. This is an inherited genetic disorder.  Thyroid problems, such as an under- or  overactive thyroid.  Cystic fibrosis.  Sexually transmitted diseases.  Other factors include:  Age. A man's fertility declines with age.  Drinking too much alcohol.  Using drugs.  Being exposed to environmental toxins, such as pesticides and lead. What are the symptoms of infertility? Being unable to get pregnant after one year of having regular sex without using birth control is the only sign of infertility. How is infertility diagnosed? In order to be diagnosed with infertility, both partners will have a physical exam. Both partners will also have an extensive medical and sexual history taken. If there is no obvious reason for infertility, additional tests may be done. What Tests Will Women Have?  Women may first have tests to check whether they are ovulating each month. The tests may include:  Blood tests  to check hormone levels.  An ultrasound of the ovaries. This looks for possible problems on or in the ovaries.  Taking a small sample of the tissue that lines the uterus for examination under a microscope (endometrial biopsy). Women who are ovulating may have additional tests. These may include:  Hysterosalpingography.  This is an X-ray of the fallopian tubes and uterus taken after a specific type of dye is injected.  This test can show the shape of the uterus and whether the fallopian tubes are open.  Laparoscopy.  In this test, a lighted tube (laparoscope) is used to look for problems in the fallopian tubes and other female organs.  Transvaginal ultrasound.  This is an imaging test to check for abnormalities of the uterus and ovaries.  A health care provider can use this test to count the number of follicles on the ovaries.  Hysteroscopy.  This test involves using a lighted tube to examine the cervix and inside the uterus.  It is done to find any abnormalities inside the uterus. What Tests Will Men Have?  Tests for men's infertility includes:  Semen tests to  check sperm count, morphology, and motility.  Blood tests to check for hormone levels.  Taking a small sample of tissue from inside a testicle (biopsy). This is examined under a microscope.  Blood tests to check for genetic abnormalities (genetic testing). How are women treated for infertility? Treatment depends on the cause of infertility. Most cases of infertility in women are treated with medicine or surgery.  Women may take medicine to:  Correct ovulation problems.  Treat other health conditions, such as PCOS.  Surgery may be done to:  Repair damage to the ovaries, fallopian tubes, cervix, or uterus.  Remove growths from the uterus.  Remove scar tissue from the uterus, pelvis, or other female organs. How are men treated for infertility? Treatment depends on the cause of infertility. Most cases of infertility in men are treated with medicine or surgery.  Men may take medicine to:  Correct hormone problems.  Treat other health conditions.  Treat sexual dysfunction.  Surgery may be done to:  Remove blockages in the reproductive tract.  Correct other structural problems of the reproductive tract. What is assisted reproductive technology? Assisted reproductive technology (ART) refers to all treatments and procedures that combine eggs and sperm outside the body to try to help a couple conceive. ART is often combined with fertility drugs to stimulate ovulation. Sometimes ART is done using eggs retrieved from another woman's body (donor eggs) or from previously frozen fertilized eggs (embryos). There are different types of ART. These include:  Intrauterine insemination (IUI).  In this procedure, sperm is placed directly into a woman's uterus with a long, thin tube.  This may be most effective for infertility caused by sperm problems, including low sperm count and low motility.  Can be used in combination with fertility drugs.  In vitro fertilization (IVF).  This is  often done when a woman's fallopian tubes are blocked or when a man has low sperm counts.  Fertility drugs stimulate the ovaries to produce multiple eggs. Once mature, these eggs are removed from the body and combined with the sperm to be fertilized.  These fertilized eggs are then placed in the woman's uterus. This information is not intended to replace advice given to you by your health care provider. Make sure you discuss any questions you have with your health care provider. Document Released: 01/22/2003 Document Revised: 06/21/2015 Document Reviewed: 10/04/2013 Elsevier  Education  2017 Else[employer] Inc.  

## 2016-02-28 NOTE — Progress Notes (Signed)
Subjective:  Patient ID: Lisa Cooley, female    DOB: 1985-04-24  Age: 31 y.o. MRN: [id number]  CC: Establish Care   HPI Lisa Cooley presents for DM and difficulty trying to conceive. Reports being without diabetic supplies and metformin for 2 months. Reports polydipsia and polyuria. She denies any blurry vision or skin wounds. She does report seeing floaters 2 days ago. She reports regular menstrual cycles. LPM was 02/21/16. She reports heavy menses with large blood clots. Symptoms include intermittent stomach cramps. She reports attempting to get pregnant since she was 48. Reports trying for months now with no success. She also c/o headaches everyday. She denies any blurred vision, N/V, or sensitivity to light. Describes the pain as a dull ache.    Outpatient Medications Prior to Visit  Medication Sig Dispense Refill  . Blood Glucose Monitoring Suppl (ACCU-CHEK AVIVA PLUS) W/DEVICE KIT 1 kit by Does not apply route 3 (three) times daily. Check BS 3 times a day 1 kit 0  . glucose blood (ACCU-CHEK AVIVA) test strip Use as instructed 100 each 12  . Lancets (ACCU-CHEK SOFT TOUCH) lancets Use as instructed 100 each 12  . metFORMIN (GLUCOPHAGE) 1000 MG tablet Take 1 tablet (1,000 mg total) by mouth 2 (two) times daily with a meal. 180 tablet 3   No facility-administered medications prior to visit.     ROS Review of Systems  Eyes: Positive for visual disturbance (floating black dots).  Respiratory: Negative.   Cardiovascular: Negative.   Gastrointestinal: Negative.   Endocrine: Positive for polydipsia and polyuria.  Genitourinary: Negative.   Neurological: Positive for headaches.    Objective:  BP 134/81 (BP Location: Right Arm, Patient Position: Sitting, Cuff Size: Large)   Pulse (!) 111   Temp 98.6 F (37 C) (Oral)   Resp 18   Ht _0  (1.702 m)   Wt (!) 374 lb 12.8 oz (170 kg)   LMP 02/21/2016   SpO2 99%   BMI 58.70 kg/m   BP/Weight 02/28/2016 12/21/2015  08/03/7791  Systolic BP 903 009 233  Diastolic BP 81 92 97  Wt. (Lbs) 374.8 - 350  BMI 58.7 - 54.8   Physical Exam  Constitutional:  obese  Eyes: Conjunctivae are normal. Pupils are equal, round, and reactive to light.  Cardiovascular: Normal rate, regular rhythm, normal heart sounds and intact distal pulses.   Pulmonary/Chest: Effort normal and breath sounds normal.  Abdominal: Soft. Bowel sounds are normal. There is tenderness (LUQ).  Skin: Skin is warm and dry.  Monofilament foot exam is wnl.  Nursing note and vitals reviewed.  Assessment & Plan:   Problem List Items Addressed This Visit      Endocrine   DM2 (diabetes mellitus, type 2) ([place]) - Primary   Relevant Medications   metFORMIN (GLUCOPHAGE) 1000 MG tablet   glipiZIDE (GLUCOTROL) 5 MG tablet   Lancets (ACCU-CHEK SOFT TOUCH) lancets   Blood Glucose Monitoring Suppl (ACCU-CHEK AVIVA PLUS) w/Device KIT   Other Relevant Orders   Glucose (CBG) (Completed)   HgB A1c (Completed)   BASIC METABOLIC PANEL WITH GFR (Completed)   Lipid Panel (Completed)    Other Visit Diagnoses    Menorrhagia with regular cycle       Relevant Orders   CBC with Differential (Completed)   Inability to conceive, female       -Discussed importance of DM, BP, and weight control in aiding fertility.   Relevant Orders   FSH/LH (Completed)   Ambulatory referral to  Gynecology   TSH (Completed)   hCG, serum, qualitative (Completed)   Essential hypertension       -Suspect headaches attributed to hypertension.   Relevant Medications   hydrochlorothiazide (HYDRODIURIL) 12.5 MG tablet   Left upper quadrant pain       Relevant Orders   Lipase (Completed)   Floaters in visual field, bilateral       Relevant Orders   Ambulatory referral to Ophthalmology   Dysmenorrhea       Relevant Medications   ibuprofen (ADVIL,MOTRIN) 800 MG tablet     Meds ordered this encounter  Medications  . metFORMIN (GLUCOPHAGE) 1000 MG tablet    Sig: Take 1 tablet  (1,000 mg total) by mouth 2 (two) times daily with a meal.    Dispense:  90 tablet    Refill:  2    Order Specific Question:   Supervising Provider    Answer:   Tresa Garter [id number]  . glipiZIDE (GLUCOTROL) 5 MG tablet    Sig: Take 0.5 tablets (2.5 mg total) by mouth daily before breakfast.    Dispense:  30 tablet    Refill:  2    Order Specific Question:   Supervising Provider    Answer:   Tresa Garter [id number]  . Lancets (ACCU-CHEK SOFT TOUCH) lancets    Sig: Use as instructed    Dispense:  100 each    Refill:  12    Order Specific Question:   Supervising Provider    Answer:   Tresa Garter [[id number]]  . Blood Glucose Monitoring Suppl (ACCU-CHEK AVIVA PLUS) w/Device KIT    Sig: 1 kit by Does not apply route 3 (three) times daily. Check BS 3 times a day    Dispense:  1 kit    Refill:  0    Order Specific Question:   Supervising Provider    Answer:   Tresa Garter [id number]  . hydrochlorothiazide (HYDRODIURIL) 12.5 MG tablet    Sig: Take 2 tablets (25 mg total) by mouth daily.    Dispense:  30 tablet    Refill:  2    Order Specific Question:   Supervising Provider    Answer:   Tresa Garter [id number]  . ibuprofen (ADVIL,MOTRIN) 800 MG tablet    Sig: Take 1 tablet (800 mg total) by mouth every 8 (eight) hours as needed.    Dispense:  40 tablet    Refill:  0    Order Specific Question:   Supervising Provider    Answer:   Tresa Garter [[id number]]    Follow-up: Return in 2 weeks (on 03/13/2016) for Appt. with Clinical Pharmacist Stacy for Glucose and BP check. Alfonse Spruce FNP

## 2016-02-28 NOTE — Progress Notes (Signed)
Patient is here for establish care   Patient complains about stomach pain that comes and goes worst when her menstrual cycle comes   Patient also has concerns on having difficult getting pregnant   Patient declined the flu shot today

## 2016-02-29 LAB — BASIC METABOLIC PANEL WITH GFR
BUN: 11 mg/dL (ref 7–25)
CALCIUM: 9.6 mg/dL (ref 8.6–10.2)
CO2: 21 mmol/L (ref 20–31)
CREATININE: 0.69 mg/dL (ref 0.50–1.10)
Chloride: 104 mmol/L (ref 98–110)
GFR, Est African American: >89 mL/min (ref >=60)
GFR, Est Non African American: >89 mL/min (ref >=60)
Glucose, Bld: 154 mg/dL — ABNORMAL HIGH (ref 65–99)
Potassium: 4.3 mmol/L (ref 3.5–5.3)
SODIUM: 139 mmol/L (ref 135–146)

## 2016-02-29 LAB — LIPID PANEL
CHOLESTEROL: 252 mg/dL — AB (ref <200)
HDL: 54 mg/dL (ref >50)
LDL CALC: 175 mg/dL — AB (ref <100)
TRIGLYCERIDES: 115 mg/dL (ref <150)
Total CHOL/HDL Ratio: 4.7 Ratio (ref <5.0)
VLDL: 23 mg/dL (ref <30)

## 2016-02-29 LAB — FSH/LH
FSH: 8.6 m[IU]/mL
LH: 5.1 m[IU]/mL

## 2016-02-29 LAB — LIPASE

## 2016-02-29 LAB — HCG, SERUM, QUALITATIVE: PREG SERUM: NEGATIVE

## 2016-03-05 ENCOUNTER — Other Ambulatory Visit: Payer: Self-pay | Admitting: Family Medicine

## 2016-03-09 ENCOUNTER — Other Ambulatory Visit: Payer: Self-pay | Admitting: Family Medicine

## 2016-03-09 ENCOUNTER — Telehealth: Payer: Self-pay | Admitting: Family Medicine

## 2016-03-09 DIAGNOSIS — E782 Mixed hyperlipidemia: Secondary | ICD-10-CM

## 2016-03-09 MED ORDER — ROSUVASTATIN CALCIUM 20 MG PO TABS: 20.0000 mg | ORAL_TABLET | Freq: Every day | ORAL | 2 refills | Status: DC

## 2016-03-09 NOTE — Telephone Encounter (Signed)
Pt called regarding new glucose meter ...influenza system it was sent 02/28/16 but pt is unsure if that would be the new meter that she needing please f/up pt is unsure.

## 2016-03-10 MED ORDER — GLUCOSE BLOOD VI STRP: ORAL_STRIP | 12 refills | Status: DC

## 2016-03-10 MED ORDER — TRUE METRIX METER W/DEVICE KIT: 1.0000 | PACK | Freq: Three times a day (TID) | 0 refills | Status: DC

## 2016-03-10 MED ORDER — TRUEPLUS LANCETS 28G MISC: 1.0000 | Freq: Three times a day (TID) | 12 refills | Status: DC

## 2016-03-10 NOTE — Addendum Note (Signed)
Addended by: Margaretmary LombardLISBON, Scarlettrose Costilow K on: 03/10/2016 10:05 AM   Modules accepted: Orders

## 2016-03-10 NOTE — Telephone Encounter (Signed)
Patient verified DOB Patient is aware of urine pregnancy being negative. Patient is also aware of cholesterol level being elevated and needing to pick up Crestor.  Patient advised to implement dark leafy greens and nuts to increase iron. Patient is aware of needing to begin an OTC multivitamin to also received the daily dose of iron needed.. Patient is aware of a recheck being completed in 3 months. Patient expressed her understanding and had no further questions at this time.

## 2016-03-10 NOTE — Telephone Encounter (Signed)
-----   Message from Lizbeth BarkMandesia R Hairston, FNP sent at 03/09/2016  8:39 AM EST ----- -Urine pregnancy negative. -Lipid levels were elevated. This can increase your risk of heart disease. You were prescribed crestor 20 mg to help lower your risk. Follow up for recheck in 3 months. -Increase your dietary iron intake. Good sources of iron include dark green leafy vegetables, meats, beans, and iron fortified cereals. You can also take an over the counter (OTC) multivitamin that has recommended daily dose of iron.

## 2016-03-17 ENCOUNTER — Ambulatory Visit: Payer: Self-pay | Admitting: Pharmacist

## 2016-04-02 ENCOUNTER — Encounter: Payer: Self-pay | Admitting: Family Medicine

## 2016-04-02 ENCOUNTER — Ambulatory Visit: Payer: Self-pay | Attending: Family Medicine | Admitting: Family Medicine

## 2016-04-02 VITALS — BP 131/83 | HR 105 | Temp 98.7°F | Resp 18 | Ht 67.0 in | Wt 376.8 lb

## 2016-04-02 DIAGNOSIS — Z87828 Personal history of other (healed) physical injury and trauma: Secondary | ICD-10-CM

## 2016-04-02 DIAGNOSIS — M533 Sacrococcygeal disorders, not elsewhere classified: Secondary | ICD-10-CM

## 2016-04-02 DIAGNOSIS — Z7984 Long term (current) use of oral hypoglycemic drugs: Secondary | ICD-10-CM | POA: Insufficient documentation

## 2016-04-02 DIAGNOSIS — M5441 Lumbago with sciatica, right side: Secondary | ICD-10-CM | POA: Insufficient documentation

## 2016-04-02 DIAGNOSIS — M62830 Muscle spasm of back: Secondary | ICD-10-CM | POA: Insufficient documentation

## 2016-04-02 DIAGNOSIS — G8929 Other chronic pain: Secondary | ICD-10-CM | POA: Insufficient documentation

## 2016-04-02 DIAGNOSIS — E782 Mixed hyperlipidemia: Secondary | ICD-10-CM | POA: Insufficient documentation

## 2016-04-02 DIAGNOSIS — M25562 Pain in left knee: Secondary | ICD-10-CM | POA: Insufficient documentation

## 2016-04-02 DIAGNOSIS — Z79899 Other long term (current) drug therapy: Secondary | ICD-10-CM | POA: Insufficient documentation

## 2016-04-02 DIAGNOSIS — I10 Essential (primary) hypertension: Secondary | ICD-10-CM | POA: Insufficient documentation

## 2016-04-02 DIAGNOSIS — M25561 Pain in right knee: Secondary | ICD-10-CM

## 2016-04-02 DIAGNOSIS — M5442 Lumbago with sciatica, left side: Secondary | ICD-10-CM | POA: Insufficient documentation

## 2016-04-02 DIAGNOSIS — E119 Type 2 diabetes mellitus without complications: Secondary | ICD-10-CM | POA: Insufficient documentation

## 2016-04-02 LAB — GLUCOSE, POCT (MANUAL RESULT ENTRY): POC Glucose: 210 mg/dl — AB (ref 70–99)

## 2016-04-02 MED ORDER — IBUPROFEN 800 MG PO TABS: 800.0000 mg | ORAL_TABLET | Freq: Three times a day (TID) | ORAL | 0 refills | Status: DC | PRN

## 2016-04-02 MED ORDER — CYCLOBENZAPRINE HCL 10 MG PO TABS: 10.0000 mg | ORAL_TABLET | Freq: Three times a day (TID) | ORAL | 0 refills | Status: DC | PRN

## 2016-04-02 MED ORDER — GLIPIZIDE 5 MG PO TABS: 2.5000 mg | ORAL_TABLET | Freq: Every day | ORAL | 2 refills | Status: DC

## 2016-04-02 MED ORDER — HYDROCHLOROTHIAZIDE 12.5 MG PO TABS
25.0000 mg | ORAL_TABLET | Freq: Every day | ORAL | 2 refills | Status: DC
Start: 2016-04-02 — End: 2016-05-29

## 2016-04-02 MED ORDER — GLUCOSE BLOOD VI STRP: ORAL_STRIP | 12 refills | Status: DC

## 2016-04-02 MED ORDER — TRUEPLUS LANCETS 28G MISC: 1.0000 | Freq: Three times a day (TID) | 12 refills | Status: DC

## 2016-04-02 MED ORDER — TRUE METRIX METER W/DEVICE KIT: 1.0000 | PACK | Freq: Three times a day (TID) | 0 refills | Status: DC

## 2016-04-02 MED ORDER — METFORMIN HCL 1000 MG PO TABS: 1000.0000 mg | ORAL_TABLET | Freq: Two times a day (BID) | ORAL | 2 refills | Status: DC

## 2016-04-02 MED ORDER — ROSUVASTATIN CALCIUM 20 MG PO TABS: 20.0000 mg | ORAL_TABLET | Freq: Every day | ORAL | 2 refills | Status: DC

## 2016-04-02 MED FILL — ?CYCLOBENZAPRINE 10 MG TABL: 10 | 13 days supply | Qty: 40 | Fill #0

## 2016-04-02 MED FILL — IBUPROFEN 800 MG TABLET: 800 | 13 days supply | Qty: 40 | Fill #0

## 2016-04-02 NOTE — Progress Notes (Signed)
Patient is here for Pain in her both legs from her knee to  the bottom  Patient also complains back pain that comes and goes  Patient has not taking her insulin  Patient has eaten today

## 2016-04-02 NOTE — Patient Instructions (Addendum)
Apply for orange card to complete referral process.  Back Pain, Adult Back pain is very common. The pain often gets better over time. The cause of back pain is usually not dangerous. Most people can learn to manage their back pain on their own. Follow these instructions at home: Watch your back pain for any changes. The following actions may help to lessen any pain you are feeling:  Stay active. Start with short walks on flat ground if you can. Try to walk farther each day.  Exercise regularly as told by your doctor. Exercise helps your back heal faster. It also helps avoid future injury by keeping your muscles strong and flexible.  Do not sit, drive, or stand in one place for more than 30 minutes.  Do not stay in bed. Resting more than 1-2 days can slow down your recovery.  Be careful when you bend or lift an object. Use good form when lifting:  Bend at your knees.  Keep the object close to your body.  Do not twist.  Sleep on a firm mattress. Lie on your side, and bend your knees. If you lie on your back, put a pillow under your knees.  Take medicines only as told by your doctor.  Put ice on the injured area.  Put ice in a plastic bag.  Place a towel between your skin and the bag.  Leave the ice on for 20 minutes, 2-3 times a day for the first 2-3 days. After that, you can switch between ice and heat packs.  Avoid feeling anxious or stressed. Find good ways to deal with stress, such as exercise.  Maintain a healthy weight. Extra weight puts stress on your back. Contact a doctor if:  You have pain that does not go away with rest or medicine.  You have worsening pain that goes down into your legs or buttocks.  You have pain that does not get better in one week.  You have pain at night.  You lose weight.  You have a fever or chills. Get help right away if:  You cannot control when you poop (bowel movement) or pee (urinate).  Your arms or legs feel weak.  Your  arms or legs lose feeling (numbness).  You feel sick to your stomach (nauseous) or throw up (vomit).  You have belly (abdominal) pain.  You feel like you may pass out (faint). This information is not intended to replace advice given to you by your health care provider. Make sure you discuss any questions you have with your health care provider. Document Released: 07/08/2007 Document Revised: 06/27/2015 Document Reviewed: 05/23/2013 Elsevier Interactive Patient Education  2017 [employer] Exercising to [employer] Minor[employer]ose Weight Exercising can help you to lose weight. In order to lose weight through exercise, you need to do vigorous-intensity exercise. You can tell that you are exercising with vigorous intensity if you are breathing very hard and fast and cannot hold a conversation while exercising. Moderate-intensity exercise helps to maintain your current weight. You can tell that you are exercising at a moderate level if you have a higher heart rate and faster breathing, but you are still able to hold a conversation. How often should I exercise? Choose an activity that you enjoy and set realistic goals. Your health care provider can help you to make an activity plan that works for you. Exercise regularly as directed by your health care provider. This may include:  Doing resistance training twice each week, such as:  Push-ups.  Sit-ups.  Lifting weights.  Using resistance bands.  Doing a given intensity of exercise for a given amount of time. Choose from these options:  150 minutes of moderate-intensity exercise every week.  75 minutes of vigorous-intensity exercise every week.  A mix of moderate-intensity and vigorous-intensity exercise every week. Children, pregnant women, people who are out of shape, people who are overweight, and older adults may need to consult a health care provider for individual recommendations. If you have any sort of medical condition, be sure to consult your health  care provider before starting a new exercise program. What are some activities that can help me to lose weight?  Walking at a rate of at least 4.5 miles an hour.  Jogging or running at a rate of 5 miles per hour.  Biking at a rate of at least 10 miles per hour.  Lap swimming.  Roller-skating or in-line skating.  Cross-country skiing.  Vigorous competitive sports, such as football, basketball, and soccer.  Jumping rope.  Aerobic dancing. How can I be more active in my day-to-day activities?  Use the stairs instead of the elevator.  Take a walk during your lunch break.  If you drive, park your car farther away from work or school.  If you take public transportation, get off one stop early and walk the rest of the way.  Make all of your phone calls while standing up and walking around.  Get up, stretch, and walk around every 30 minutes throughout the day. What guidelines should I follow while exercising?  Do not exercise so much that you hurt yourself, feel dizzy, or get very short of breath.  Consult your health care provider prior to starting a new exercise program.  Wear comfortable clothes and shoes with good support.  Drink plenty of water while you exercise to prevent dehydration or heat stroke. Body water is lost during exercise and must be replaced.  Work out until you breathe faster and your heart beats faster. This information is not intended to replace advice given to you by your health care provider. Make sure you discuss any questions you have with your health care provider. Document Released: 02/21/2010 Document Revised: 06/27/2015 Document Reviewed: 06/22/2013 Elsevier Interactive Patient Education  2017 Elsevier Inc.  Cyclobenzaprine tablets What is this medicine? CYCLOBENZAPRINE (sye kloe BEN za preen) is a muscle relaxer. It is used to treat muscle pain, spasms, and stiffness. This medicine may be used for other purposes; ask your health care  provider or pharmacist if you have questions. COMMON BRAND NAME(S): Fexmid, Flexeril What should I tell my health care provider before I take this medicine? They need to know if you have any of these conditions: -heart disease, irregular heartbeat, or previous heart attack -liver disease -thyroid problem -an unusual or allergic reaction to cyclobenzaprine, tricyclic antidepressants, lactose, other medicines, foods, dyes, or preservatives -pregnant or trying to get pregnant -breast-feeding How should I use this medicine? Take this medicine by mouth with a glass of water. Follow the directions on the prescription label. If this medicine upsets your stomach, take it with food or milk. Take your medicine at regular intervals. Do not take it more often than directed. Talk to your pediatrician regarding the use of this medicine in children. Special care may be needed. Overdosage: If you think you have taken too much of this medicine contact a poison control center or emergency room at once. NOTE: This medicine is only for you. Do not share this medicine with others. What if  I miss a dose? If you miss a dose, take it as soon as you can. If it is almost time for your next dose, take only that dose. Do not take double or extra doses. What may interact with this medicine? Do not take this medicine with any of the following medications: -certain medicines for fungal infections like fluconazole, itraconazole, ketoconazole, posaconazole, voriconazole -cisapride -dofetilide -dronedarone -halofantrine -levomethadyl -MAOIs like Carbex, Eldepryl, Marplan, Nardil, and Parnate -narcotic medicines for cough -pimozide -thioridazine -ziprasidone This medicine may also interact with the following medications: -alcohol -antihistamines for allergy, cough and cold -certain medicines for anxiety or sleep -certain medicines for cancer -certain medicines for depression like amitriptyline, fluoxetine,  sertraline -certain medicines for infection like alfuzosin, chloroquine, clarithromycin, levofloxacin, mefloquine, pentamidine, troleandomycin -certain medicines for irregular heart beat -certain medicines for seizures like phenobarbital, primidone -contrast dyes -general anesthetics like halothane, isoflurane, methoxyflurane, propofol -local anesthetics like lidocaine, pramoxine, tetracaine -medicines that relax muscles for surgery -narcotic medicines for pain -other medicines that prolong the QT interval (cause an abnormal heart rhythm) -phenothiazines like chlorpromazine, mesoridazine, prochlorperazine This list may not describe all possible interactions. Give your health care provider a list of all the medicines, herbs, non-prescription drugs, or dietary supplements you use. Also tell them if you smoke, drink alcohol, or use illegal drugs. Some items may interact with your medicine. What should I watch for while using this medicine? Tell your doctor or health care professional if your symptoms do not start to get better or if they get worse. You may get drowsy or dizzy. Do not drive, use machinery, or do anything that needs mental alertness until you know how this medicine affects you. Do not stand or sit up quickly, especially if you are an older patient. This reduces the risk of dizzy or fainting spells. Alcohol may interfere with the effect of this medicine. Avoid alcoholic drinks. If you are taking another medicine that also causes drowsiness, you may have more side effects. Give your health care provider a list of all medicines you use. Your doctor will tell you how much medicine to take. Do not take more medicine than directed. Call emergency for help if you have problems breathing or unusual sleepiness. Your mouth may get dry. Chewing sugarless gum or sucking hard candy, and drinking plenty of water may help. Contact your doctor if the problem does not go away or is severe. What side  effects may I notice from receiving this medicine? Side effects that you should report to your doctor or health care professional as soon as possible: -allergic reactions like skin rash, itching or hives, swelling of the face, lips, or tongue -breathing problems -chest pain -fast, irregular heartbeat -hallucinations -seizures -unusually weak or tired Side effects that usually do not require medical attention (report to your doctor or health care professional if they continue or are bothersome): -headache -nausea, vomiting This list may not describe all possible side effects. Call your doctor for medical advice about side effects. You may report side effects to FDA at 1-800-FDA-1088. Where should I keep my medicine? Keep out of the reach of children. Store at room temperature between 15 and 30 degrees C (59 and 86 degrees F). Keep container tightly closed. Throw away any unused medicine after the expiration date. NOTE: This sheet is a summary. It may not cover all possible information. If you have questions about this medicine, talk to your doctor, pharmacist, or health care provider.  2018 Elsevier/Gold Standard (2014-10-30 12:05:46)
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
## 2016-04-03 ENCOUNTER — Ambulatory Visit (HOSPITAL_COMMUNITY)
Admission: RE | Admit: 2016-04-03 | Discharge: 2016-04-03 | Disposition: A | Discharge status: Home / Self Care | Payer: Self-pay | Source: Ambulatory Visit | Attending: Family Medicine | Admitting: Family Medicine

## 2016-04-03 DIAGNOSIS — M25561 Pain in right knee: Secondary | ICD-10-CM | POA: Insufficient documentation

## 2016-04-03 DIAGNOSIS — M5441 Lumbago with sciatica, right side: Secondary | ICD-10-CM | POA: Insufficient documentation

## 2016-04-03 DIAGNOSIS — G8929 Other chronic pain: Secondary | ICD-10-CM | POA: Insufficient documentation

## 2016-04-03 DIAGNOSIS — M25562 Pain in left knee: Secondary | ICD-10-CM | POA: Insufficient documentation

## 2016-04-03 DIAGNOSIS — M5442 Lumbago with sciatica, left side: Secondary | ICD-10-CM | POA: Insufficient documentation

## 2016-04-03 DIAGNOSIS — M533 Sacrococcygeal disorders, not elsewhere classified: Secondary | ICD-10-CM | POA: Insufficient documentation

## 2016-04-03 DIAGNOSIS — M1711 Unilateral primary osteoarthritis, right knee: Secondary | ICD-10-CM | POA: Insufficient documentation

## 2016-04-03 DIAGNOSIS — Z87828 Personal history of other (healed) physical injury and trauma: Secondary | ICD-10-CM | POA: Insufficient documentation

## 2016-04-06 ENCOUNTER — Telehealth: Payer: Self-pay | Admitting: Family Medicine

## 2016-04-06 NOTE — Telephone Encounter (Signed)
Pt. Came into facility requesting her x-ray results. Please f/u

## 2016-04-06 NOTE — Telephone Encounter (Signed)
Pt. Came into facility requesting her x-ray results. Please f/u with pt.

## 2016-04-07 ENCOUNTER — Other Ambulatory Visit: Payer: Self-pay | Admitting: Family Medicine

## 2016-04-07 NOTE — Telephone Encounter (Signed)
-  Knee x-rays show changes consistent with osteoarthritis. No dislocation or fracture present. -X-ray of spine and coccyx (tailbone) show no fracture or dislocation or disc compression present. Recommend conservative measures first. Take extra strength Tylenol or NSAID's like ibuprofen or aleve for pain. Also recommended low impact exercises, dietary interventions, as well as weight loss to help improve joint symptoms.  -If symptoms persistent despite interventions recommend orthopedic referral.

## 2016-04-07 NOTE — Telephone Encounter (Signed)
CMA call to inform x ray results  Patient did not answer but CMA left a VM stating the reason of the call & to call me back

## 2016-04-08 NOTE — Telephone Encounter (Signed)
Patient return CMA phone call  Patient Verify DOB  Patient was aware and understood

## 2016-04-20 ENCOUNTER — Other Ambulatory Visit: Payer: Self-pay | Admitting: Family Medicine

## 2016-04-20 ENCOUNTER — Telehealth: Payer: Self-pay | Admitting: Family Medicine

## 2016-04-20 NOTE — Telephone Encounter (Signed)
I will send referral request to the referral specialist. Please advice patient that not all specialists accept orange card or Medicaid patients. If she does not have insurance and has not applied for the orange card she will need to, to complete referral the process.   [facility] [phone]) at  [address] [place], [place] [address].

## 2016-04-20 NOTE — Telephone Encounter (Signed)
Patient called the office to speak with PCP regarding placing a new referral to specialist of choice. [facility] [phone]) at  [address] [place], [place] [address]. Please follow up.  Thank you.

## 2016-04-20 NOTE — Telephone Encounter (Signed)
[facility]  Don't participate with orange card ,[place] or Medicaid they charge $228 for consultation  After that depend of the treatment . I left a voice mail to patient to contact me .

## 2016-04-20 NOTE — Telephone Encounter (Signed)
CMA call to inform patient that her pcp already did the requested referral to the specialist  Patient was aware and understood

## 2016-04-20 NOTE — Telephone Encounter (Signed)
Patient called the office to speak with PCP regarding placing a new referral to specialist of choice. [facility] [phone]) at  [address] [place], [place] [address]. Please follow up.  Thank you.  

## 2016-04-27 ENCOUNTER — Ambulatory Visit: Payer: Medicaid Other | Attending: Family Medicine

## 2016-05-29 ENCOUNTER — Ambulatory Visit: Payer: Self-pay | Attending: Internal Medicine | Admitting: Physician Assistant

## 2016-05-29 VITALS — BP 144/102 | HR 106 | Temp 99.0°F | Resp 97 | Wt 377.6 lb

## 2016-05-29 DIAGNOSIS — I10 Essential (primary) hypertension: Secondary | ICD-10-CM | POA: Insufficient documentation

## 2016-05-29 DIAGNOSIS — E119 Type 2 diabetes mellitus without complications: Secondary | ICD-10-CM | POA: Insufficient documentation

## 2016-05-29 DIAGNOSIS — R52 Pain, unspecified: Secondary | ICD-10-CM | POA: Insufficient documentation

## 2016-05-29 DIAGNOSIS — Z6841 Body Mass Index (BMI) 40.0 and over, adult: Secondary | ICD-10-CM | POA: Insufficient documentation

## 2016-05-29 DIAGNOSIS — Z7984 Long term (current) use of oral hypoglycemic drugs: Secondary | ICD-10-CM | POA: Insufficient documentation

## 2016-05-29 DIAGNOSIS — H6690 Otitis media, unspecified, unspecified ear: Secondary | ICD-10-CM

## 2016-05-29 DIAGNOSIS — H6691 Otitis media, unspecified, right ear: Secondary | ICD-10-CM | POA: Insufficient documentation

## 2016-05-29 DIAGNOSIS — M62838 Other muscle spasm: Secondary | ICD-10-CM | POA: Insufficient documentation

## 2016-05-29 DIAGNOSIS — E669 Obesity, unspecified: Secondary | ICD-10-CM | POA: Insufficient documentation

## 2016-05-29 DIAGNOSIS — Z79899 Other long term (current) drug therapy: Secondary | ICD-10-CM | POA: Insufficient documentation

## 2016-05-29 LAB — POCT GLYCOSYLATED HEMOGLOBIN (HGB A1C): Hemoglobin A1C: 9.5

## 2016-05-29 LAB — GLUCOSE, POCT (MANUAL RESULT ENTRY): POC GLUCOSE: 174 mg/dL — AB (ref 70–99)

## 2016-05-29 MED ORDER — HYDROCHLOROTHIAZIDE 12.5 MG PO TABS: 25.0000 mg | ORAL_TABLET | Freq: Every day | ORAL | 2 refills | Status: DC

## 2016-05-29 MED ORDER — FLUTICASONE PROPIONATE 50 MCG/ACT NA SUSP: 2.0000 | Freq: Every day | NASAL | 6 refills | Status: DC

## 2016-05-29 MED ORDER — NAPROXEN 500 MG PO TABS: 500.0000 mg | ORAL_TABLET | Freq: Two times a day (BID) | ORAL | 1 refills | Status: DC

## 2016-05-29 MED ORDER — AMOXICILLIN 500 MG PO CAPS: 500.0000 mg | ORAL_CAPSULE | Freq: Three times a day (TID) | ORAL | 0 refills | Status: DC

## 2016-05-29 MED ORDER — METHOCARBAMOL 500 MG PO TABS: 500.0000 mg | ORAL_TABLET | Freq: Three times a day (TID) | ORAL | 0 refills | Status: DC

## 2016-05-29 MED FILL — NAPROXEN 500 MG TABLET: 500 | 30 days supply | Qty: 60 | Fill #0

## 2016-05-29 MED FILL — AMOXICILLIN 500 MG CAPSULE: 500 | 10 days supply | Qty: 30 | Fill #0

## 2016-05-29 MED FILL — FLUTICASONE PROP 50 MCG SPR: 50 | 30 days supply | Qty: 16 | Fill #0

## 2016-05-29 MED FILL — ?HYDROCHLOROTHIAZIDE 12.5MG: 12.5 | 15 days supply | Qty: 30 | Fill #0

## 2016-05-29 NOTE — Progress Notes (Signed)
Lisa Cooley, is a 31 y.o. female  JHE:[id number]  JEH:[id number]  DOB - April 20, 1985  Subjective:  Chief Complaint and HPI: Lisa Cooley is a 31 y.o. female here today for 5 d h/o sinus congestion and pressure with R ear pain.  She has also been feeling hot then cold but hasnt checked her temperature.  She has also been having frequent HA.  She admits to non-compliance with HCTZ and hasn't been taking it due to cost concerns.  She is only taking diabetic medications sometimes.  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 Ibuprofen has helped some with this.  She denies any vision changes/SOB/CP.  ROS:   Constitutional:  No f/c, No night sweats, No unexplained weight loss. EENT:  No vision changes, No blurry vision, No hearing changes.   Respiratory: No cough, No SOB Cardiac: No CP, no palpitations GI:  No abd pain, No N/V/D. GU: No Urinary s/sx Musculoskeletal: No joint pain Neuro: + headache, no dizziness, no motor weakness.  Skin: No rash Endocrine:  No polydipsia. No polyuria.  Psych: Denies SI/HI  No problems updated.  ALLERGIES: No Known Allergies  PAST MEDICAL HISTORY: Past Medical History:  Diagnosis Date  . Diabetes mellitus    Dx in [place] but not on meds  . Headache(784.0)   . Obese     MEDICATIONS AT HOME: Prior to Admission medications   Medication Sig Start Date End Date Taking? Authorizing Provider  amoxicillin (AMOXIL) 500 MG capsule Take 1 capsule (500 mg total) by mouth 3 (three) times daily. 05/29/16   Argentina Donovan, PA-C  Blood Glucose Monitoring Suppl (TRUE METRIX METER) w/Device KIT 1 each by Does not apply route 3 (three) times daily. 04/02/16   Alfonse Spruce, FNP  cyclobenzaprine (FLEXERIL) 10 MG tablet Take 1 tablet (10 mg total) by mouth 3 (three) times daily as needed for muscle spasms. 04/02/16   Alfonse Spruce, FNP  fluticasone (FLONASE) 50 MCG/ACT nasal spray Place 2 sprays  into both nostrils daily. 05/29/16   Argentina Donovan, PA-C  glipiZIDE (GLUCOTROL) 5 MG tablet Take 0.5 tablets (2.5 mg total) by mouth daily before breakfast. 04/02/16   Alfonse Spruce, FNP  glucose blood (TRUE METRIX BLOOD GLUCOSE TEST) test strip Use as instructed 04/02/16   Alfonse Spruce, FNP  hydrochlorothiazide (HYDRODIURIL) 12.5 MG tablet Take 2 tablets (25 mg total) by mouth daily. 05/29/16   Argentina Donovan, PA-C  metFORMIN (GLUCOPHAGE) 1000 MG tablet Take 1 tablet (1,000 mg total) by mouth 2 (two) times daily with a meal. 04/02/16   Alfonse Spruce, FNP  methocarbamol (ROBAXIN) 500 MG tablet Take 1 tablet (500 mg total) by mouth 3 (three) times daily. Prn muscle spasm 05/29/16   Argentina Donovan, PA-C  naproxen (NAPROSYN) 500 MG tablet Take 1 tablet (500 mg total) by mouth 2 (two) times daily with a meal. Prn pain 05/29/16   Argentina Donovan, PA-C  rosuvastatin (CRESTOR) 20 MG tablet Take 1 tablet (20 mg total) by mouth daily. 04/02/16   Alfonse Spruce, FNP  TRUEPLUS LANCETS 28G MISC 1 each by Does not apply route 3 (three) times daily. 04/02/16   Alfonse Spruce, FNP     Objective:  EXAM:   Vitals:   05/29/16 0942  BP: (!) 144/102  Pulse: (!) 106  Resp: (!) 97  Temp: 99 F (37.2 C)  TempSrc: Oral  Weight: (!) 377 lb 9.6 oz (  171.3 kg)    General appearance : A&OX3. NAD. Non-toxic-appearing HEENT: Atraumatic and Normocephalic.  PERRLA. EOM intact.  R TM with erythema and bulging. Mouth-MMM, post pharynx WNL w/o erythema, No PND.  Nose is congested. Neck: supple, no JVD. No cervical lymphadenopathy. No thyromegaly Chest/Lungs:  Breathing-non-labored, Good air entry bilaterally, breath sounds normal without rales, rhonchi, or wheezing  CVS: S1 S2 regular, no murmurs, gallops, rubs  Extremities: Bilateral Lower Ext shows no edema, both legs are warm to touch with = pulse throughout Neurology:  CN II-XII grossly intact, Non focal.   Psych:  TP linear. J/I WNL. Normal  speech. Appropriate eye contact and affect.  Skin:  No Rash  Data Review Lab Results  Component Value Date   HGBA1C 9.5 05/29/2016   HGBA1C 9.5 02/28/2016   HGBA1C 9.40 04/20/2014     Assessment & Plan   1. Type 2 diabetes mellitus without complication, without long-term current use of insulin ([place]) Must take meds!  She is only partially compliant with medication regimen - POCT glucose (manual entry) - POCT glycosylated hemoglobin (Hb A1C)  2. Hypertension, unspecified type Uncontrolled. Restart - hydrochlorothiazide (HYDRODIURIL) 12.5 MG tablet; Take 2 tablets (25 mg total) by mouth daily.  Dispense: 30 tablet; Refill: 2  3. Acute otitis media, unspecified otitis media type on R - amoxicillin (AMOXIL) 500 MG capsule; Take 1 capsule (500 mg total) by mouth 3 (three) times daily.  Dispense: 30 capsule; Refill: 0 - fluticasone (FLONASE) 50 MCG/ACT nasal spray; Place 2 sprays into both nostrils daily.  Dispense: 16 g; Refill: 6  4. Muscle spasm - methocarbamol (ROBAXIN) 500 MG tablet; Take 1 tablet (500 mg total) by mouth 3 (three) times daily. Prn muscle spasm  Dispense: 90 tablet; Refill: 0  5. Body aches - naproxen (NAPROSYN) 500 MG tablet; Take 1 tablet (500 mg total) by mouth 2 (two) times daily with a meal. Prn pain  Dispense: 60 tablet; Refill: 1       Patient have been counseled extensively about nutrition and exercise  Return in about 5 weeks (around 07/03/2016) for [facility] for BP and DM.  The patient was given clear instructions to go to ER or return to medical center if symptoms don't improve, worsen or new problems develop. The patient verbalized understanding. The patient was told to call to get lab results if they haven't heard anything in the next week.     Freeman Caldron, PA-C [facility] [place], [place]   05/29/2016, 10:00 AMPatient ID: Lisa Cooley, female   DOB: [birth year], 31 y.o.   MRN:  [id number]

## 2016-06-03 MED FILL — METHOCARBAMOL 500 MG TABLET: 500 | 30 days supply | Qty: 90 | Fill #0

## 2016-07-02 ENCOUNTER — Encounter (HOSPITAL_COMMUNITY): Payer: Self-pay | Admitting: Emergency Medicine

## 2016-07-02 ENCOUNTER — Ambulatory Visit (HOSPITAL_COMMUNITY)
Admission: EM | Admit: 2016-07-02 | Discharge: 2016-07-02 | Disposition: A | Discharge status: Home / Self Care | Payer: Self-pay | Attending: Internal Medicine | Admitting: Internal Medicine

## 2016-07-02 DIAGNOSIS — N926 Irregular menstruation, unspecified: Secondary | ICD-10-CM

## 2016-07-02 DIAGNOSIS — E119 Type 2 diabetes mellitus without complications: Secondary | ICD-10-CM | POA: Insufficient documentation

## 2016-07-02 DIAGNOSIS — Z3202 Encounter for pregnancy test, result negative: Secondary | ICD-10-CM

## 2016-07-02 DIAGNOSIS — Z87891 Personal history of nicotine dependence: Secondary | ICD-10-CM | POA: Insufficient documentation

## 2016-07-02 DIAGNOSIS — R1012 Left upper quadrant pain: Secondary | ICD-10-CM | POA: Insufficient documentation

## 2016-07-02 DIAGNOSIS — I10 Essential (primary) hypertension: Secondary | ICD-10-CM | POA: Insufficient documentation

## 2016-07-02 DIAGNOSIS — E669 Obesity, unspecified: Secondary | ICD-10-CM | POA: Insufficient documentation

## 2016-07-02 DIAGNOSIS — Z7984 Long term (current) use of oral hypoglycemic drugs: Secondary | ICD-10-CM | POA: Insufficient documentation

## 2016-07-02 HISTORY — DX: Essential (primary) hypertension: I10

## 2016-07-02 LAB — POCT URINALYSIS DIP (DEVICE)
GLUCOSE, UA: NEGATIVE mg/dL
Ketones, ur: 15 mg/dL — AB
Nitrite: NEGATIVE
Protein, ur: 100 mg/dL — AB
Specific Gravity, Urine: 1.02 (ref 1.005–1.030)
Urobilinogen, UA: 1 mg/dL (ref 0.0–1.0)
pH: 6 (ref 5.0–8.0)

## 2016-07-02 LAB — HCG, SERUM, QUALITATIVE: PREG SERUM: NEGATIVE

## 2016-07-02 LAB — POCT PREGNANCY, URINE: PREG TEST UR: NEGATIVE

## 2016-07-02 NOTE — ED Triage Notes (Signed)
Patient reports being 8 days late on cycle.  Period was spotting.  Today looks like a normal menstrual flow.  Patient has low back pain and abdominal pain-x one week.

## 2016-07-02 NOTE — ED Notes (Signed)
Urine specimen obtained while patient in lobby. Specimen in lab 

## 2016-07-02 NOTE — ED Provider Notes (Signed)
CSN: [id number]     Arrival date & time 07/02/16  1024 History   First MD Initiated Contact with Patient 07/02/16 1118     Chief Complaint  Patient presents with  . Abdominal Pain   (Consider location/radiation/quality/duration/timing/severity/associated sxs/prior Treatment) Subjective:  Lisa Cooley is a 31 y.o. female who presents for evaluation of menstrual irregularity. Patient states that she typically has normal menses. Her period is currently 8 days late but she began spotting 1 day ago. The bleeding is a little heavier today than yesterday but still not as her usual menses. She believes that she could be pregnant and is actually trying to get pregnant. Current symptoms also include: nausea, left upper quadrant pain and low back pain. She denies any dysuria, vomiting, breast tenderness or fatigue. Patient's last menstrual period was 05/22/2016 was reportedly normal.           Past Medical History:  Diagnosis Date  . Diabetes mellitus    Dx in [place] but not on meds  . Headache(784.0)   . Hypertension   . Obese    Past Surgical History:  Procedure Laterality Date  . NO PAST SURGERIES     Family History  Problem Relation Age of Onset  . Cancer Father   . Heart disease Sister   . Other Neg Hx    Social History  Substance Use Topics  . Smoking status: Former Smoker    Packs/day: 0.25    Types: Cigarettes    Quit date: 04/20/2014  . Smokeless tobacco: Never Used  . Alcohol use No   OB History    Gravida Para Term Preterm AB Living   0 0 0   0 0   SAB TAB Ectopic Multiple Live Births   0             Review of Systems  Gastrointestinal: Positive for abdominal pain and nausea. Negative for constipation, diarrhea and vomiting.  Genitourinary: Positive for vaginal bleeding. Negative for dysuria, vaginal discharge and vaginal pain.  Musculoskeletal: Positive for back pain.    Allergies  Patient has no known allergies.  Home Medications   Prior to  Admission medications   Medication Sig Start Date End Date Taking? Authorizing Provider  amoxicillin (AMOXIL) 500 MG capsule Take 1 capsule (500 mg total) by mouth 3 (three) times daily. 05/29/16   Argentina Donovan, PA-C  Blood Glucose Monitoring Suppl (TRUE METRIX METER) w/Device KIT 1 each by Does not apply route 3 (three) times daily. 04/02/16   Alfonse Spruce, FNP  cyclobenzaprine (FLEXERIL) 10 MG tablet Take 1 tablet (10 mg total) by mouth 3 (three) times daily as needed for muscle spasms. 04/02/16   Alfonse Spruce, FNP  fluticasone (FLONASE) 50 MCG/ACT nasal spray Place 2 sprays into both nostrils daily. 05/29/16   Argentina Donovan, PA-C  glipiZIDE (GLUCOTROL) 5 MG tablet Take 0.5 tablets (2.5 mg total) by mouth daily before breakfast. 04/02/16   Fredia Beets R, FNP  glucose blood (TRUE METRIX BLOOD GLUCOSE TEST) test strip Use as instructed 04/02/16   Alfonse Spruce, FNP  hydrochlorothiazide (HYDRODIURIL) 12.5 MG tablet Take 2 tablets (25 mg total) by mouth daily. 05/29/16   Argentina Donovan, PA-C  metFORMIN (GLUCOPHAGE) 1000 MG tablet Take 1 tablet (1,000 mg total) by mouth 2 (two) times daily with a meal. 04/02/16   Hairston, Maylon Peppers, FNP  methocarbamol (ROBAXIN) 500 MG tablet Take 1 tablet (500 mg total) by mouth 3 (three) times daily. Prn  muscle spasm 05/29/16   Argentina Donovan, PA-C  naproxen (NAPROSYN) 500 MG tablet Take 1 tablet (500 mg total) by mouth 2 (two) times daily with a meal. Prn pain 05/29/16   Argentina Donovan, PA-C  rosuvastatin (CRESTOR) 20 MG tablet Take 1 tablet (20 mg total) by mouth daily. 04/02/16   Alfonse Spruce, FNP  TRUEPLUS LANCETS 28G MISC 1 each by Does not apply route 3 (three) times daily. 04/02/16   Alfonse Spruce, FNP   Meds Ordered and Administered this Visit  Medications - No data to display  BP (!) 162/87 (BP Location: Right Arm) Comment (BP Location): right, forearm  Pulse (!) 102   Temp 98.8 F (37.1 C) (Oral)   Resp (!)  22   LMP 05/22/2016   SpO2 96%  No data found.   Physical Exam  Constitutional: She is oriented to person, place, and time. She appears well-developed and well-nourished.  Obese   HENT:  Head: Normocephalic.  Neck: Normal range of motion.  Cardiovascular: Normal rate and regular rhythm.   Pulmonary/Chest: Effort normal and breath sounds normal.  Abdominal: Soft. Bowel sounds are normal. She exhibits no distension.  Left upper quadrant tenderness with voluntary guarding but no rebound   Musculoskeletal: Normal range of motion.  Neurological: She is alert and oriented to person, place, and time.  Skin: Skin is warm and dry.  Psychiatric: She has a normal mood and affect.    Urgent Care Course     Procedures (including critical care time)  Labs Review Labs Reviewed  POCT URINALYSIS DIP (DEVICE) - Abnormal; Notable for the following:       Result Value   Bilirubin Urine SMALL (*)    Ketones, ur 15 (*)    Hgb urine dipstick LARGE (*)    Protein, ur 100 (*)    Leukocytes, UA TRACE (*)    All other components within normal limits    Imaging Review No results found.   Visual Acuity Review  Right Eye Distance:   Left Eye Distance:   Bilateral Distance:    Right Eye Near:   Left Eye Near:    Bilateral Near:         MDM   1. Left upper quadrant pain     Patient's urine pregnancy test was negative; however, her friend that is with her informed the patient then she had a negative pregnancy test but was actually pregnant. The patient is now very concerned that even in light of a negative pregnancy test that she could still be pregnant. Low suspicion for ectopic as patient has negative urine pregnancy test. She is hemodynamically stable and without any pelvic pain. Will obtain a Serum HCG which is most likely to also be negative. Will discharge patient and contact her with the results. Advised patient to follow-up with OB/GYN for further evaluation.   LUQ pain,  unclear etiology. Patient advised to go to ER for further evaluation/imaging.   Discussed diagnosis and treatment with patient. All questions have been answered and all concerns have been addressed. The patient verbalized understanding and had no further questions      Enrique Sack, [place] 07/02/16 1149

## 2016-07-17 ENCOUNTER — Ambulatory Visit: Payer: Self-pay | Admitting: Family Medicine

## 2016-07-27 ENCOUNTER — Emergency Department (HOSPITAL_COMMUNITY)
Admission: EM | Admit: 2016-07-27 | Discharge: 2016-07-27 | Disposition: A | Discharge status: Home / Self Care | Payer: Self-pay | Attending: Emergency Medicine | Admitting: Emergency Medicine

## 2016-07-27 ENCOUNTER — Encounter (HOSPITAL_COMMUNITY): Payer: Self-pay | Admitting: Emergency Medicine

## 2016-07-27 DIAGNOSIS — E1165 Type 2 diabetes mellitus with hyperglycemia: Secondary | ICD-10-CM

## 2016-07-27 DIAGNOSIS — R739 Hyperglycemia, unspecified: Secondary | ICD-10-CM

## 2016-07-27 DIAGNOSIS — Z79899 Other long term (current) drug therapy: Secondary | ICD-10-CM | POA: Insufficient documentation

## 2016-07-27 DIAGNOSIS — B3731 Acute candidiasis of vulva and vagina: Secondary | ICD-10-CM

## 2016-07-27 DIAGNOSIS — B373 Candidiasis of vulva and vagina: Secondary | ICD-10-CM | POA: Insufficient documentation

## 2016-07-27 DIAGNOSIS — I10 Essential (primary) hypertension: Secondary | ICD-10-CM | POA: Insufficient documentation

## 2016-07-27 DIAGNOSIS — Z711 Person with feared health complaint in whom no diagnosis is made: Secondary | ICD-10-CM

## 2016-07-27 DIAGNOSIS — Z7984 Long term (current) use of oral hypoglycemic drugs: Secondary | ICD-10-CM | POA: Insufficient documentation

## 2016-07-27 DIAGNOSIS — Z202 Contact with and (suspected) exposure to infections with a predominantly sexual mode of transmission: Secondary | ICD-10-CM | POA: Insufficient documentation

## 2016-07-27 DIAGNOSIS — E119 Type 2 diabetes mellitus without complications: Secondary | ICD-10-CM

## 2016-07-27 LAB — URINALYSIS, ROUTINE W REFLEX MICROSCOPIC
Bilirubin Urine: NEGATIVE
GLUCOSE, UA: NEGATIVE mg/dL
HGB URINE DIPSTICK: NEGATIVE
Ketones, ur: 5 mg/dL — AB
NITRITE: NEGATIVE
PH: 5 (ref 5.0–8.0)
Protein, ur: NEGATIVE mg/dL
SPECIFIC GRAVITY, URINE: 1.021 (ref 1.005–1.030)

## 2016-07-27 LAB — I-STAT CHEM 8, ED
BUN: 11 mg/dL (ref 6–20)
Calcium, Ion: 1.13 mmol/L — ABNORMAL LOW (ref 1.15–1.40)
Chloride: 101 mmol/L (ref 101–111)
Creatinine, Ser: 0.6 mg/dL (ref 0.44–1.00)
Glucose, Bld: 209 mg/dL — ABNORMAL HIGH (ref 65–99)
HEMATOCRIT: 35 % — AB (ref 36.0–46.0)
HEMOGLOBIN: 11.9 g/dL — AB (ref 12.0–15.0)
POTASSIUM: 3.8 mmol/L (ref 3.5–5.1)
SODIUM: 136 mmol/L (ref 135–145)
TCO2: 25 mmol/L (ref 0–100)

## 2016-07-27 LAB — WET PREP, GENITAL
Clue Cells Wet Prep HPF POC: NONE SEEN
SPERM: NONE SEEN
Trich, Wet Prep: NONE SEEN

## 2016-07-27 LAB — I-STAT BETA HCG BLOOD, ED (MC, WL, AP ONLY)

## 2016-07-27 MED ORDER — METFORMIN HCL 1000 MG PO TABS: 1000.0000 mg | ORAL_TABLET | Freq: Two times a day (BID) | ORAL | 0 refills | Status: DC

## 2016-07-27 MED ORDER — LIDOCAINE HCL (PF) 1 % IJ SOLN
0.9000 mL | Freq: Once | INTRAMUSCULAR | Status: AC
  Administered 2016-07-27: 0.9 mL
  Filled 2016-07-27: qty 5

## 2016-07-27 MED ORDER — CEFTRIAXONE SODIUM 250 MG IJ SOLR
250.0000 mg | Freq: Once | INTRAMUSCULAR | Status: AC
  Administered 2016-07-27: 250 mg via INTRAMUSCULAR
  Filled 2016-07-27: qty 250

## 2016-07-27 MED ORDER — FLUCONAZOLE 150 MG PO TABS: 150.0000 mg | ORAL_TABLET | Freq: Once | ORAL | 0 refills | Status: AC

## 2016-07-27 MED ORDER — AZITHROMYCIN 250 MG PO TABS
1000.0000 mg | ORAL_TABLET | Freq: Once | ORAL | Status: AC
  Administered 2016-07-27: 1000 mg via ORAL
  Filled 2016-07-27: qty 4

## 2016-07-27 NOTE — ED Triage Notes (Signed)
Pt reports vaginal itching and irritation after going to the pool yesterday, denies any discharge or pain.

## 2016-07-27 NOTE — ED Provider Notes (Signed)
[facility]-EMERGENCY DEPT Provider Note   CSN: [id number] Arrival date & time: 07/27/16  4098     History   Chief Complaint Chief Complaint  Patient presents with  . Vaginal Itching    HPI Lisa Cooley is a 31 y.o. female.  Lisa Cooley is a 31 y.o. female with hx of poorly controlled DM type II presents to the emergency department today with a complaint of vaginal itching and irritation. She noticed mild irritation yesterday morning in the shower, stating that she may have scratched the area. She applied vagisil cream. She went to the swimming pool and later in the evening noticed her sx worsening. She endorses vaginal itching that is constant as well as an occasional "pinching" sensation.  She is sexually active with one female partner and no hx of STI. She has had unprotected sex.  LMP 07/02/16. She reports she is been prescribed metformin, but does not take it usually. She does report some urinary frequency without dysuria or urgency. She denies vaginal odor, discharge, urinary sx, abdominal pain, fever, and vomiting.    Vaginal Itching  Pertinent negatives include no chest pain, no abdominal pain, no headaches and no shortness of breath.    Past Medical History:  Diagnosis Date  . Diabetes mellitus    Dx in [place] but not on meds  . Headache(784.0)   . Hypertension   . Obese     Patient Active Problem List   Diagnosis Date Noted  . Epigastric pain 08/13/2011  . DM2 (diabetes mellitus, type 2) (HCC) 08/12/2011  . Obesity 08/12/2011  . Infertility, female 08/12/2011  . Irregular periods 08/12/2011    Past Surgical History:  Procedure Laterality Date  . NO PAST SURGERIES      OB History    Gravida Para Term Preterm AB Living   0 0 0   0 0   SAB TAB Ectopic Multiple Live Births   0               Home Medications    Prior to Admission medications   Medication Sig Start Date End Date Taking? Authorizing Provider  cyclobenzaprine (FLEXERIL) 10 MG  tablet Take 1 tablet (10 mg total) by mouth 3 (three) times daily as needed for muscle spasms. 04/02/16  Yes Hairston, Oren Beckmann, FNP  hydrochlorothiazide (HYDRODIURIL) 12.5 MG tablet Take 2 tablets (25 mg total) by mouth daily. 05/29/16  Yes McClung, Marzella Schlein, PA-C  methocarbamol (ROBAXIN) 500 MG tablet Take 1 tablet (500 mg total) by mouth 3 (three) times daily. Prn muscle spasm Patient taking differently: Take 500 mg by mouth 3 (three) times daily as needed for muscle spasms.  05/29/16  Yes McClung, Angela M, PA-C  naproxen (NAPROSYN) 500 MG tablet Take 1 tablet (500 mg total) by mouth 2 (two) times daily with a meal. Prn pain Patient taking differently: Take 500 mg by mouth 2 (two) times daily as needed for mild pain. Prn pain 05/29/16  Yes Anders Simmonds, PA-C  fluconazole (DIFLUCAN) 150 MG tablet Take 1 tablet (150 mg total) by mouth once. 07/27/16 07/27/16  Everlene Farrier, PA-C  fluticasone (FLONASE) 50 MCG/ACT nasal spray Place 2 sprays into both nostrils daily. Patient not taking: Reported on 07/27/2016 05/29/16   Anders Simmonds, PA-C  metFORMIN (GLUCOPHAGE) 1000 MG tablet Take 1 tablet (1,000 mg total) by mouth 2 (two) times daily with a meal. 07/27/16   Everlene Farrier, PA-C  rosuvastatin (CRESTOR) 20 MG tablet Take 1 tablet (20 mg  total) by mouth daily. Patient not taking: Reported on 07/27/2016 04/02/16   Lizbeth BarkHairston, Mandesia R, FNP    Family History Family History  Problem Relation Age of Onset  . Cancer Father   . Heart disease Sister   . Other Neg Hx     Social History Social History  Substance Use Topics  . Smoking status: Former Smoker    Packs/day: 0.25    Types: Cigarettes    Quit date: 04/20/2014  . Smokeless tobacco: Never Used  . Alcohol use No     Allergies   Patient has no known allergies.   Review of Systems Review of Systems  Constitutional: Negative for chills and fever.  HENT: Negative for congestion and sore throat.   Eyes: Negative for visual  disturbance.  Respiratory: Negative for cough and shortness of breath.   Cardiovascular: Negative for chest pain.  Gastrointestinal: Negative for abdominal pain, diarrhea, nausea and vomiting.  Genitourinary: Positive for frequency and vaginal discharge. Negative for difficulty urinating, dysuria, hematuria and urgency.  Musculoskeletal: Negative for back pain and neck pain.  Skin: Negative for rash.  Neurological: Negative for light-headedness and headaches.     Physical Exam Updated Vital Signs BP (!) 149/96   Pulse 97   Temp 97.5 F (36.4 C) (Oral)   Resp 16   LMP 07/02/2016   SpO2 95%   Physical Exam  Constitutional: She appears well-developed and well-nourished. No distress.  Nontoxic appearing. Obese female.  HENT:  Head: Normocephalic and atraumatic.  Mouth/Throat: Oropharynx is clear and moist.  Eyes: Conjunctivae are normal. Right eye exhibits no discharge. Left eye exhibits no discharge.  Neck: Neck supple.  Cardiovascular: Normal rate, regular rhythm, normal heart sounds and intact distal pulses.  Exam reveals no gallop and no friction rub.   No murmur heard. Pulmonary/Chest: Effort normal and breath sounds normal. No respiratory distress. She has no wheezes. She has no rales.  Abdominal: Soft. Bowel sounds are normal. She exhibits no distension. There is no tenderness. There is no guarding.  Abdomen soft and nontender to palpation.  Genitourinary: Vaginal discharge found.  Genitourinary Comments: Pelvic exam with female PA student as chaperone. No external lesions or rashes noted. Moderate amount of white vaginal discharge noted. Cervix is closed. No cervical motion tenderness. No adnexal tenderness or fullness.  Musculoskeletal: She exhibits no edema.  Lymphadenopathy:    She has no cervical adenopathy.  Neurological: She is alert. Coordination normal.  Skin: Skin is warm and dry. No rash noted. She is not diaphoretic. No erythema. No pallor.  Psychiatric: She  has a normal mood and affect. Her behavior is normal.  Nursing note and vitals reviewed.    ED Treatments / Results  Labs (all labs ordered are listed, but only abnormal results are displayed) Labs Reviewed  WET PREP, GENITAL - Abnormal; Notable for the following:       Result Value   Yeast Wet Prep HPF POC PRESENT (*)    WBC, Wet Prep HPF POC MANY (*)    All other components within normal limits  URINALYSIS, ROUTINE W REFLEX MICROSCOPIC - Abnormal; Notable for the following:    APPearance CLOUDY (*)    Ketones, ur 5 (*)    Leukocytes, UA LARGE (*)    Bacteria, UA RARE (*)    Squamous Epithelial / LPF 6-30 (*)    All other components within normal limits  I-STAT CHEM 8, ED - Abnormal; Notable for the following:    Glucose, Bld 209 (*)  Calcium, Ion 1.13 (*)    Hemoglobin 11.9 (*)    HCT 35.0 (*)    All other components within normal limits  I-STAT BETA HCG BLOOD, ED ([facility], [facility], [facility] ONLY)  GC/CHLAMYDIA PROBE AMP ([place]) NOT AT [facility]    EKG  EKG Interpretation None       Radiology No results found.  Procedures Procedures (including critical care time)  Medications Ordered in ED Medications  cefTRIAXone (ROCEPHIN) injection 250 mg (250 mg Intramuscular Given 07/27/16 1306)  azithromycin (ZITHROMAX) tablet 1,000 mg (1,000 mg Oral Given 07/27/16 1306)  lidocaine (PF) (XYLOCAINE) 1 % injection 0.9 mL (0.9 mLs Other Given 07/27/16 1306)     Initial Impression / Assessment and Plan / ED Course  I have reviewed the triage vital signs and the nursing notes.  Pertinent labs & imaging results that were available during my care of the patient were reviewed by me and considered in my medical decision making (see chart for details).    This is a 31 y.o. female with hx of poorly controlled DM type II presents to the emergency department today with a complaint of vaginal itching and irritation. She noticed mild irritation yesterday morning in the shower, stating that she may  have scratched the area. She applied vagisil cream. She went to the swimming pool and later in the evening noticed her sx worsening. She endorses vaginal itching that is constant as well as an occasional "pinching" sensation.  She is sexually active with one female partner and no hx of STI. She has had unprotected sex.  LMP 07/02/16. She reports she is been prescribed metformin, but does not take it usually. She does report some urinary frequency without dysuria or urgency. On exam the patient is afebrile nontoxic appearing. She has moderate white vaginal discharge. No cervical motion tenderness. Wet prep shows yeast and many white blood cells. Will empirically treat for gonorrhea and Chlamydia today. I discussed that the city testing is pending. I encouraged her to follow-up on these test results. Glucose is elevated at 209. We discussed her needing to be compliant with her metformin. She agrees. Will refill her metformin. Discharge with metformin and Diflucan. [facility]. I discussed safe sex practices. I advised the patient to follow-up with their primary care provider this week. I advised the patient to return to the emergency department with new or worsening symptoms or new concerns. The patient verbalized understanding and agreement with plan.     Final Clinical Impressions(s) / ED Diagnoses   Final diagnoses:  Hyperglycemia  Type 2 diabetes mellitus with hyperglycemia, without long-term current use of insulin (HCC)  Yeast vaginitis  Concern about STD in female without diagnosis    New Prescriptions Discharge Medication List as of 07/27/2016  1:01 PM    START taking these medications   Details  fluconazole (DIFLUCAN) 150 MG tablet Take 1 tablet (150 mg total) by mouth once., Starting Mon 07/27/2016, Print         Everlene Farrier, PA-C 07/27/16 1718    Charlynne Pander, MD 07/27/16 [phone]

## 2016-07-28 LAB — GC/CHLAMYDIA PROBE AMP (~~LOC~~) NOT AT ARMC
Chlamydia: NEGATIVE
NEISSERIA GONORRHEA: NEGATIVE

## 2016-08-27 ENCOUNTER — Ambulatory Visit: Payer: Self-pay | Admitting: Family Medicine

## 2016-10-08 ENCOUNTER — Encounter: Payer: Self-pay | Admitting: Family Medicine

## 2016-10-14 ENCOUNTER — Other Ambulatory Visit: Payer: Self-pay | Admitting: Family Medicine

## 2016-10-14 NOTE — Telephone Encounter (Signed)
Patient concern

## 2016-12-29 ENCOUNTER — Ambulatory Visit (HOSPITAL_COMMUNITY)
Admission: RE | Admit: 2016-12-29 | Discharge: 2016-12-29 | Disposition: A | Discharge status: Home / Self Care | Payer: Self-pay | Source: Ambulatory Visit | Attending: Family Medicine | Admitting: Family Medicine

## 2016-12-29 ENCOUNTER — Encounter: Payer: Self-pay | Admitting: Family Medicine

## 2016-12-29 ENCOUNTER — Ambulatory Visit: Payer: Self-pay | Attending: Family Medicine | Admitting: Family Medicine

## 2016-12-29 ENCOUNTER — Other Ambulatory Visit: Payer: Self-pay

## 2016-12-29 VITALS — BP 134/76 | HR 94 | Temp 98.3°F | Resp 18 | Ht 67.0 in | Wt 375.4 lb

## 2016-12-29 DIAGNOSIS — R079 Chest pain, unspecified: Secondary | ICD-10-CM | POA: Insufficient documentation

## 2016-12-29 DIAGNOSIS — Z8709 Personal history of other diseases of the respiratory system: Secondary | ICD-10-CM | POA: Insufficient documentation

## 2016-12-29 DIAGNOSIS — E119 Type 2 diabetes mellitus without complications: Secondary | ICD-10-CM | POA: Insufficient documentation

## 2016-12-29 DIAGNOSIS — Z7984 Long term (current) use of oral hypoglycemic drugs: Secondary | ICD-10-CM | POA: Insufficient documentation

## 2016-12-29 DIAGNOSIS — Z760 Encounter for issue of repeat prescription: Secondary | ICD-10-CM | POA: Insufficient documentation

## 2016-12-29 DIAGNOSIS — R05 Cough: Secondary | ICD-10-CM | POA: Insufficient documentation

## 2016-12-29 DIAGNOSIS — R0602 Shortness of breath: Secondary | ICD-10-CM | POA: Insufficient documentation

## 2016-12-29 DIAGNOSIS — I10 Essential (primary) hypertension: Secondary | ICD-10-CM | POA: Insufficient documentation

## 2016-12-29 DIAGNOSIS — J069 Acute upper respiratory infection, unspecified: Secondary | ICD-10-CM | POA: Insufficient documentation

## 2016-12-29 DIAGNOSIS — E782 Mixed hyperlipidemia: Secondary | ICD-10-CM | POA: Insufficient documentation

## 2016-12-29 DIAGNOSIS — Z79899 Other long term (current) drug therapy: Secondary | ICD-10-CM | POA: Insufficient documentation

## 2016-12-29 LAB — POCT GLYCOSYLATED HEMOGLOBIN (HGB A1C): Hemoglobin A1C: 9.9

## 2016-12-29 LAB — GLUCOSE, POCT (MANUAL RESULT ENTRY): POC GLUCOSE: 157 mg/dL — AB (ref 70–99)

## 2016-12-29 MED ORDER — METFORMIN HCL 1000 MG PO TABS: 1000.0000 mg | ORAL_TABLET | Freq: Two times a day (BID) | ORAL | 0 refills | Status: DC

## 2016-12-29 MED ORDER — METFORMIN HCL 1000 MG PO TABS: 1000.0000 mg | ORAL_TABLET | Freq: Two times a day (BID) | ORAL | 2 refills | Status: DC

## 2016-12-29 MED ORDER — PHENYLEPHRINE-CHLORPHEN-DM 5-2-15 MG/5ML PO SYRP: 5.0000 mL | ORAL_SOLUTION | Freq: Four times a day (QID) | ORAL | 0 refills | Status: DC | PRN

## 2016-12-29 MED ORDER — ROSUVASTATIN CALCIUM 20 MG PO TABS: 20.0000 mg | ORAL_TABLET | Freq: Every day | ORAL | 2 refills | Status: DC

## 2016-12-29 MED ORDER — GLIPIZIDE 5 MG PO TABS: 5.0000 mg | ORAL_TABLET | Freq: Every day | ORAL | 2 refills | Status: DC

## 2016-12-29 MED ORDER — FLUTICASONE PROPIONATE 50 MCG/ACT NA SUSP: 2.0000 | Freq: Every day | NASAL | 6 refills | Status: DC

## 2016-12-29 MED ORDER — HYDROCHLOROTHIAZIDE 12.5 MG PO TABS: 25.0000 mg | ORAL_TABLET | Freq: Every day | ORAL | 2 refills | Status: DC

## 2016-12-29 MED ORDER — AZITHROMYCIN 250 MG PO TABS: ORAL_TABLET | ORAL | 0 refills | Status: DC

## 2016-12-29 MED ORDER — ALBUTEROL SULFATE HFA 108 (90 BASE) MCG/ACT IN AERS: 2.0000 | INHALATION_SPRAY | Freq: Four times a day (QID) | RESPIRATORY_TRACT | 2 refills | Status: DC | PRN

## 2016-12-29 MED FILL — ROSUVASTATIN CALCIUM 20 MG: 20 | 30 days supply | Qty: 30 | Fill #0

## 2016-12-29 MED FILL — FLUTICASONE PROP 50 MCG SPR: 50 | 30 days supply | Qty: 16 | Fill #0

## 2016-12-29 MED FILL — ?METFORMIN HCL 1,000 MG TAB: 1000 | 30 days supply | Qty: 60 | Fill #0

## 2016-12-29 MED FILL — AZITHROMYCIN 250 MG TABLET: 250 | 5 days supply | Qty: 6 | Fill #0

## 2016-12-29 MED FILL — !VENTOLIN HFA INHALER: 108 (90 BAS | 25 days supply | Qty: 18 | Fill #0

## 2016-12-29 MED FILL — ?HYDROCHLOROTHIAZIDE 12.5MG: 12.5 | 15 days supply | Qty: 30 | Fill #0

## 2016-12-29 NOTE — Progress Notes (Signed)
Subjective:  Patient ID: Lisa Cooley, female    DOB: 1985/11/25  Age: 31 y.o. MRN: [id number]  CC: URI and Diabetes   HPI Lisa Cooley presents for follow up for complains of symptoms of a URI. Symptoms include congestion, cough, and chest pain. Onset of symptoms was 6 days ago, gradually worsening since that time.  She complains of chest pain, midsternal 9 out of 10, with left arm soreness she also c/o achiness, and generalized body ache.  She denies any hemoptysis or history of prolonged travel.  She has not taken her temperature at home.  She does report past history of asthma diagnosed in her early 4420s.  She does not have inhaler.  Associated symptoms include nasal congestion, productive cough with scant white colored sputum and shortness of breath. Evaluation to date: none. Treatment to date: none. Diabetes Mellitus: She reports being without her diabetic medication for 2 weeks. Symptoms: none.Patient denies foot ulcerations, hyperglycemia, nausea, paresthesia of the feet, visual disturbances and vomitting.  Evaluation to date has been included: fasting blood sugar, fasting lipid panel, hemoglobin A1C and microalbuminuria.  Home sugars: Patient does not check blood sugars daily.  Hypertension: She is not exercising and is not adherent to low salt diet.  She does not check BP at home. Patient denies exertional chest pressure/discomfort, fatigue, lower extremity edema, near-syncope, palpitations and syncope.  Cardiovascular risk factors: diabetes mellitus, hypertension, obesity (BMI >= 30 kg/m2) and sedentary lifestyle. Use of agents associated with hypertension: none. History of target organ damage: none.   Outpatient Medications Prior to Visit  Medication Sig Dispense Refill  . cyclobenzaprine (FLEXERIL) 10 MG tablet Take 1 tablet (10 mg total) by mouth 3 (three) times daily as needed for muscle spasms. 40 tablet 0  . methocarbamol (ROBAXIN) 500 MG tablet Take 1 tablet (500 mg  total) by mouth 3 (three) times daily. Prn muscle spasm (Patient taking differently: Take 500 mg by mouth 3 (three) times daily as needed for muscle spasms. ) 90 tablet 0  . naproxen (NAPROSYN) 500 MG tablet Take 1 tablet (500 mg total) by mouth 2 (two) times daily with a meal. Prn pain (Patient taking differently: Take 500 mg by mouth 2 (two) times daily as needed for mild pain. Prn pain) 60 tablet 1  . fluticasone (FLONASE) 50 MCG/ACT nasal spray Place 2 sprays into both nostrils daily. 16 g 6  . hydrochlorothiazide (HYDRODIURIL) 12.5 MG tablet Take 2 tablets (25 mg total) by mouth daily. 30 tablet 2  . metFORMIN (GLUCOPHAGE) 1000 MG tablet Take 1 tablet (1,000 mg total) by mouth 2 (two) times daily with a meal. 60 tablet 0  . rosuvastatin (CRESTOR) 20 MG tablet Take 1 tablet (20 mg total) by mouth daily. 30 tablet 2   No facility-administered medications prior to visit.     ROS Review of Systems  Constitutional:       Body aches  HENT: Positive for congestion and rhinorrhea.   Respiratory: Positive for cough.   Cardiovascular: Positive for chest pain.  Skin: Negative.     Objective:  BP 134/76 (BP Location: Left Arm, Patient Position: Sitting, Cuff Size: Normal)   Pulse 94   Temp 98.3 F (36.8 C) (Oral)   Resp 18   Ht 5\' 7"  (1.702 m)   Wt (!) 375 lb 6.4 oz (170.3 kg)   LMP 12/22/2016   SpO2 97%   BMI 58.80 kg/m   BP/Weight 12/29/2016 07/27/2016 07/02/2016  Systolic BP 134 149 162  Diastolic BP 76 96 87  Wt. (Lbs) 375.4 - -  BMI 58.8 - -     Physical Exam  Constitutional: She appears well-developed and well-nourished.  HENT:  Right Ear: External ear normal.  Left Ear: External ear normal.  Nose: Mucosal edema and rhinorrhea present.  Mouth/Throat: Posterior oropharyngeal erythema (Mild) present.  Eyes: Conjunctivae are normal. Pupils are equal, round, and reactive to light.  Neck: No JVD present.  Cardiovascular: Normal rate, regular rhythm, normal heart sounds and  intact distal pulses.  Pulmonary/Chest: Effort normal and breath sounds normal. She has no wheezes.  Abdominal: Soft. Bowel sounds are normal.  Skin: Skin is warm and dry.  Nursing note and vitals reviewed.  Assessment & Plan:   1. Type 2 diabetes mellitus without complication, without long-term current use of insulin (HCC) Encourage checking blood sugars at least daily and more attention to diet and physical activity. - Glucose (CBG) - HgB A1c - Ambulatory referral to Ophthalmology - glipiZIDE (GLUCOTROL) 5 MG tablet; Take 1 tablet (5 mg total) by mouth daily before breakfast.  Dispense: 30 tablet; Refill: 2 - metFORMIN (GLUCOPHAGE) 1000 MG tablet; Take 1 tablet (1,000 mg total) by mouth 2 (two) times daily with a meal.  Dispense: 60 tablet; Refill: 2  2. Chest pain, unspecified type Suspect chest pain may be pleuritic in nature however will workup to rule out cardiac cause. - DG Chest 2 View; Future - EKG 12-Lead - CBC - Basic Metabolic Panel  3. Upper respiratory tract infection, unspecified type  - azithromycin (ZITHROMAX) 250 MG tablet; DAY ONE: TAKE TWO TABLE BY MOUTH ONCE. THEN TAKE ONE TABLET BY MOUTH DAILY.  Dispense: 6 tablet; Refill: 0 - DG Chest 2 View; Future - fluticasone (FLONASE) 50 MCG/ACT nasal spray; Place 2 sprays into both nostrils daily.  Dispense: 16 g; Refill: 6 - Respiratory virus panel - Phenylephrine-Chlorphen-DM 06-03-13 MG/5ML SYRP; Take 5 mLs by mouth every 6 (six) hours as needed.  Dispense: 473 mL; Refill: 0  4. Personal history of asthma  - albuterol (PROVENTIL HFA;VENTOLIN HFA) 108 (90 Base) MCG/ACT inhaler; Inhale 2 puffs into the lungs every 6 (six) hours as needed for wheezing or shortness of breath.  Dispense: 1 Inhaler; Refill: 2  5. Mixed hyperlipidemia  - rosuvastatin (CRESTOR) 20 MG tablet; Take 1 tablet (20 mg total) by mouth daily.  Dispense: 30 tablet; Refill: 2  6. Essential hypertension  - hydrochlorothiazide (HYDRODIURIL) 12.5 MG  tablet; Take 2 tablets (25 mg total) by mouth daily.  Dispense: 30 tablet; Refill: 2  7. Medication refill  - rosuvastatin (CRESTOR) 20 MG tablet; Take 1 tablet (20 mg total) by mouth daily.  Dispense: 30 tablet; Refill: 2 - hydrochlorothiazide (HYDRODIURIL) 12.5 MG tablet; Take 2 tablets (25 mg total) by mouth daily.  Dispense: 30 tablet; Refill: 2      Follow-up: Return in about 1 month (around 01/28/2017) for DM .   Lizbeth BarkMandesia R Jannessa Ogden FNP

## 2016-12-29 NOTE — Patient Instructions (Signed)
Upper Respiratory Infection, Adult Most upper respiratory infections (URIs) are caused by a virus. A URI affects the nose, throat, and upper air passages. The most common type of URI is often called "the common cold." Follow these instructions at home:  Take medicines only as told by your doctor.  Gargle warm saltwater or take cough drops to comfort your throat as told by your doctor.  Use a warm mist humidifier or inhale steam from a shower to increase air moisture. This may make it easier to breathe.  Drink enough fluid to keep your pee (urine) clear or pale yellow.  Eat soups and other clear broths.  Have a healthy diet.  Rest as needed.  Go back to work when your fever is gone or your doctor says it is okay. ? You may need to stay home longer to avoid giving your URI to others. ? You can also wear a face mask and wash your hands often to prevent spread of the virus.  Use your inhaler more if you have asthma.  Do not use any tobacco products, including cigarettes, chewing tobacco, or electronic cigarettes. If you need help quitting, ask your doctor. Contact a doctor if:  You are getting worse, not better.  Your symptoms are not helped by medicine.  You have chills.  You are getting more short of breath.  You have brown or red mucus.  You have yellow or brown discharge from your nose.  You have pain in your face, especially when you bend forward.  You have a fever.  You have puffy (swollen) neck glands.  You have pain while swallowing.  You have white areas in the back of your throat. Get help right away if:  You have very bad or constant: ? Headache. ? Ear pain. ? Pain in your forehead, behind your eyes, and over your cheekbones (sinus pain). ? Chest pain.  You have long-lasting (chronic) lung disease and any of the following: ? Wheezing. ? Long-lasting cough. ? Coughing up blood. ? A change in your usual mucus.  You have a stiff neck.  You have  changes in your: ? Vision. ? Hearing. ? Thinking. ? Mood. This information is not intended to replace advice given to you by your health care provider. Make sure you discuss any questions you have with your health care provider. Document Released: 07/08/2007 Document Revised: 09/22/2015 Document Reviewed: 04/26/2013 Elsevier Interactive Patient Education  2018 Elsevier Inc.  

## 2016-12-30 ENCOUNTER — Encounter: Payer: Self-pay | Admitting: Family Medicine

## 2016-12-30 ENCOUNTER — Telehealth: Payer: Self-pay | Admitting: Family Medicine

## 2016-12-30 LAB — CBC
Hematocrit: 37 % (ref 34.0–46.6)
Hemoglobin: 12 g/dL (ref 11.1–15.9)
MCH: 22.6 pg — ABNORMAL LOW (ref 26.6–33.0)
MCHC: 32.4 g/dL (ref 31.5–35.7)
MCV: 70 fL — ABNORMAL LOW (ref 79–97)
Platelets: 479 10*3/uL — ABNORMAL HIGH (ref 150–379)
RBC: 5.32 x10E6/uL — ABNORMAL HIGH (ref 3.77–5.28)
RDW: 18.6 % — AB (ref 12.3–15.4)
WBC: 7.4 10*3/uL (ref 3.4–10.8)

## 2016-12-30 LAB — BASIC METABOLIC PANEL
BUN / CREAT RATIO: 14 (ref 9–23)
BUN: 10 mg/dL (ref 6–20)
CO2: 21 mmol/L (ref 20–29)
CREATININE: 0.74 mg/dL (ref 0.57–1.00)
Calcium: 9.4 mg/dL (ref 8.7–10.2)
Chloride: 100 mmol/L (ref 96–106)
GFR calc Af Amer: 127 mL/min/{1.73_m2} (ref >59)
GFR calc non Af Amer: 110 mL/min/{1.73_m2} (ref >59)
GLUCOSE: 144 mg/dL — AB (ref 65–99)
POTASSIUM: 4.4 mmol/L (ref 3.5–5.2)
SODIUM: 140 mmol/L (ref 134–144)

## 2016-12-30 NOTE — Telephone Encounter (Signed)
Pt. Called requesting her chest x-ray results. Please f/u

## 2016-12-30 NOTE — Telephone Encounter (Signed)
Patient called stating she needs a new glucose meter. Please fu

## 2016-12-31 ENCOUNTER — Encounter: Payer: Self-pay | Admitting: Family Medicine

## 2016-12-31 ENCOUNTER — Other Ambulatory Visit: Payer: Self-pay | Admitting: Family Medicine

## 2016-12-31 DIAGNOSIS — E669 Obesity, unspecified: Principal | ICD-10-CM

## 2016-12-31 DIAGNOSIS — E1169 Type 2 diabetes mellitus with other specified complication: Secondary | ICD-10-CM | POA: Insufficient documentation

## 2016-12-31 MED ORDER — TRUEPLUS LANCETS 28G MISC: 1.0000 | Freq: Once | 12 refills | Status: AC

## 2016-12-31 MED ORDER — TRUE METRIX METER W/DEVICE KIT: 1.0000 | PACK | Freq: Once | 0 refills | Status: AC

## 2016-12-31 MED ORDER — GLUCOSE BLOOD VI STRP: ORAL_STRIP | 12 refills | Status: DC

## 2016-12-31 NOTE — Telephone Encounter (Signed)
Will send script to pharmacy. Please inform patient that she may have to pay for new meter.

## 2016-12-31 NOTE — Telephone Encounter (Signed)
Patient MyChart Message

## 2016-12-31 NOTE — Telephone Encounter (Signed)
Prescription is at the pharmacy. Patient may be charged, for new meter.

## 2017-01-01 LAB — RESPIRATORY VIRUS PANEL
Adenovirus: NEGATIVE
INFLUENZA A: NEGATIVE
Influenza B: NEGATIVE
Metapneumovirus: NEGATIVE
PARAINFLUENZA 2 A: NEGATIVE
PARAINFLUENZA 3 A: NEGATIVE
Parainfluenza 1: NEGATIVE
RHINOVIRUS: POSITIVE — AB
Respiratory Syncytial Virus A: NEGATIVE
Respiratory Syncytial Virus B: NEGATIVE

## 2017-01-04 ENCOUNTER — Telehealth: Payer: Self-pay

## 2017-01-04 NOTE — Telephone Encounter (Signed)
CMA call regarding lab results   Patient did not answer but left a Vm stating the reason of the call & to call back  

## 2017-01-04 NOTE — Telephone Encounter (Signed)
-----   Message from Lizbeth BarkMandesia R Hairston, FNP sent at 01/04/2017  8:54 AM EST ----- RSV resulted were updated. Positive for Rhinovirus this is most commonly associated with the common cold. Continue to take medications as prescribed, increase fluid intake, rest, and practice good handwashing to prevent spread.

## 2017-01-05 NOTE — Telephone Encounter (Signed)
Left message on voicemail.

## 2017-01-08 ENCOUNTER — Telehealth: Payer: Self-pay | Admitting: *Deleted

## 2017-01-08 NOTE — Telephone Encounter (Signed)
RSV resulted were updated. Positive for Rhinovirus this is most commonly associated with the common cold. Continue to take medications as prescribed, increase fluid intake, rest, and practice good handwashing to prevent spread.     Unable to disclose message. Left message on voicemail to return call.

## 2017-01-28 ENCOUNTER — Ambulatory Visit: Payer: Self-pay | Admitting: Family Medicine

## 2017-02-01 ENCOUNTER — Telehealth: Payer: Self-pay | Admitting: *Deleted

## 2017-02-01 NOTE — Telephone Encounter (Signed)
Medical Assistant left message on patient's home and cell voicemail. Voicemail states to give a call back to [place] with [facility] at [phone]. !!!Please inform patient of chest xray being normal and labs being normal.Patient has no respiratory viruses present and patient should continue to rest and increase fluid intake with good handwashing practice!!!

## 2017-02-01 NOTE — Telephone Encounter (Signed)
-----   Message from Lizbeth BarkMandesia R Hairston, FNP sent at 12/31/2016  9:08 AM EST ----- CXR normal. No fluid within the lungs. Heart size normal Labs normal. Recommend quitting smoking.  RSV panel negative for most common respiratory viruses.  Continue to take medications as prescribed, increase fluid intake, rest, and practice good handwashing to prevent spread.

## 2017-02-22 ENCOUNTER — Encounter: Payer: Self-pay | Admitting: Family Medicine

## 2017-03-04 NOTE — Telephone Encounter (Signed)
Mychart concern

## 2017-04-27 ENCOUNTER — Encounter: Payer: Self-pay | Admitting: Physician Assistant

## 2017-04-28 ENCOUNTER — Encounter: Payer: Self-pay | Admitting: Physician Assistant

## 2017-05-10 ENCOUNTER — Emergency Department (HOSPITAL_COMMUNITY): Payer: Self-pay

## 2017-05-10 ENCOUNTER — Encounter (HOSPITAL_COMMUNITY): Payer: Self-pay | Admitting: Neurology

## 2017-05-10 ENCOUNTER — Emergency Department (HOSPITAL_COMMUNITY)
Admission: EM | Admit: 2017-05-10 | Discharge: 2017-05-10 | Disposition: A | Discharge status: Home / Self Care | Payer: Self-pay | Attending: Emergency Medicine | Admitting: Emergency Medicine

## 2017-05-10 DIAGNOSIS — B9689 Other specified bacterial agents as the cause of diseases classified elsewhere: Secondary | ICD-10-CM

## 2017-05-10 DIAGNOSIS — N3000 Acute cystitis without hematuria: Secondary | ICD-10-CM | POA: Insufficient documentation

## 2017-05-10 DIAGNOSIS — Z87891 Personal history of nicotine dependence: Secondary | ICD-10-CM | POA: Insufficient documentation

## 2017-05-10 DIAGNOSIS — N760 Acute vaginitis: Secondary | ICD-10-CM | POA: Insufficient documentation

## 2017-05-10 DIAGNOSIS — Z7984 Long term (current) use of oral hypoglycemic drugs: Secondary | ICD-10-CM | POA: Insufficient documentation

## 2017-05-10 DIAGNOSIS — R0789 Other chest pain: Secondary | ICD-10-CM | POA: Insufficient documentation

## 2017-05-10 DIAGNOSIS — Z79899 Other long term (current) drug therapy: Secondary | ICD-10-CM | POA: Insufficient documentation

## 2017-05-10 LAB — CBC WITH DIFFERENTIAL/PLATELET
BASOS ABS: 0 10*3/uL (ref 0.0–0.1)
Basophils Relative: 0 %
Eosinophils Absolute: 0.1 10*3/uL (ref 0.0–0.7)
Eosinophils Relative: 1 %
HCT: 35.9 % — ABNORMAL LOW (ref 36.0–46.0)
Hemoglobin: 11.1 g/dL — ABNORMAL LOW (ref 12.0–15.0)
LYMPHS ABS: 3.5 10*3/uL (ref 0.7–4.0)
Lymphocytes Relative: 39 %
MCH: 22.2 pg — ABNORMAL LOW (ref 26.0–34.0)
MCHC: 30.9 g/dL (ref 30.0–36.0)
MCV: 71.7 fL — ABNORMAL LOW (ref 78.0–100.0)
MONO ABS: 0.6 10*3/uL (ref 0.1–1.0)
MONOS PCT: 7 %
NEUTROS PCT: 53 %
Neutro Abs: 4.8 10*3/uL (ref 1.7–7.7)
PLATELETS: 422 10*3/uL — AB (ref 150–400)
RBC: 5.01 MIL/uL (ref 3.87–5.11)
RDW: 18.2 % — AB (ref 11.5–15.5)
WBC: 9 10*3/uL (ref 4.0–10.5)

## 2017-05-10 LAB — URINALYSIS, ROUTINE W REFLEX MICROSCOPIC
Bilirubin Urine: NEGATIVE
Glucose, UA: NEGATIVE mg/dL
Hgb urine dipstick: NEGATIVE
Ketones, ur: 5 mg/dL — AB
Nitrite: NEGATIVE
Protein, ur: NEGATIVE mg/dL
SPECIFIC GRAVITY, URINE: 1.024 (ref 1.005–1.030)
pH: 6 (ref 5.0–8.0)

## 2017-05-10 LAB — I-STAT TROPONIN, ED: Troponin i, poc: 0 ng/mL (ref 0.00–0.08)

## 2017-05-10 LAB — BASIC METABOLIC PANEL
Anion gap: 10 (ref 5–15)
BUN: 8 mg/dL (ref 6–20)
CHLORIDE: 104 mmol/L (ref 101–111)
CO2: 23 mmol/L (ref 22–32)
CREATININE: 0.71 mg/dL (ref 0.44–1.00)
Calcium: 8.9 mg/dL (ref 8.9–10.3)
Glucose, Bld: 124 mg/dL — ABNORMAL HIGH (ref 65–99)
POTASSIUM: 3.6 mmol/L (ref 3.5–5.1)
Sodium: 137 mmol/L (ref 135–145)

## 2017-05-10 LAB — WET PREP, GENITAL
SPERM: NONE SEEN
TRICH WET PREP: NONE SEEN
YEAST WET PREP: NONE SEEN

## 2017-05-10 LAB — TROPONIN I: Troponin I: <0.03 ng/mL (ref <0.03)

## 2017-05-10 LAB — I-STAT BETA HCG BLOOD, ED (MC, WL, AP ONLY)

## 2017-05-10 MED ORDER — METRONIDAZOLE 500 MG PO TABS: 500.0000 mg | ORAL_TABLET | Freq: Two times a day (BID) | ORAL | 0 refills | Status: DC

## 2017-05-10 MED ORDER — METRONIDAZOLE 500 MG PO TABS
500.0000 mg | ORAL_TABLET | Freq: Once | ORAL | Status: AC
  Administered 2017-05-10: 500 mg via ORAL
  Filled 2017-05-10: qty 1

## 2017-05-10 MED ORDER — SULFAMETHOXAZOLE-TRIMETHOPRIM 800-160 MG PO TABS: 1.0000 | ORAL_TABLET | Freq: Two times a day (BID) | ORAL | 0 refills | Status: AC

## 2017-05-10 MED ORDER — CEFTRIAXONE SODIUM 250 MG IJ SOLR
250.0000 mg | Freq: Once | INTRAMUSCULAR | Status: AC
  Administered 2017-05-10: 250 mg via INTRAMUSCULAR
  Filled 2017-05-10: qty 250

## 2017-05-10 MED ORDER — ACETAMINOPHEN 325 MG PO TABS
650.0000 mg | ORAL_TABLET | Freq: Once | ORAL | Status: AC
  Administered 2017-05-10: 650 mg via ORAL
  Filled 2017-05-10: qty 2

## 2017-05-10 MED ORDER — LIDOCAINE HCL (PF) 1 % IJ SOLN
INTRAMUSCULAR | Status: AC
  Filled 2017-05-10: qty 5

## 2017-05-10 MED ORDER — AZITHROMYCIN 250 MG PO TABS
1000.0000 mg | ORAL_TABLET | Freq: Once | ORAL | Status: AC
  Administered 2017-05-10: 1000 mg via ORAL
  Filled 2017-05-10: qty 4

## 2017-05-10 NOTE — ED Notes (Signed)
Pt states that her chest and her head hurt. She says her head hurts a 10/10 and that she hasn't eaten anything today so maybe that's why

## 2017-05-10 NOTE — ED Notes (Signed)
Alert and oriented x4. Skin warm and dry. Pt denies chest pain, was concerned of vaginal discharge and itching

## 2017-05-10 NOTE — ED Provider Notes (Signed)
[facility] EMERGENCY DEPARTMENT Provider Note   CSN: [id number] Arrival date & time: 05/10/17  1237     History   Chief Complaint Chief Complaint  Patient presents with  . Chest Pain  . Hypertension  . Vaginal Itching    HPI Lisa Cooley is a 32 y.o. female.  Pt presents to the ED today with chest pain, but really comes in because of vaginal itching.  Pt does have a hx of dm and htn, but does not take her medications every day.  She is sexually active and does not use protection.  The pt said vaginal sx have been going on for 2 days.  CP is sharp and comes and goes.  No pain now.     Past Medical History:  Diagnosis Date  . Diabetes mellitus    Dx in [place] but not on meds  . Headache(784.0)   . Hypertension   . Obese     Patient Active Problem List   Diagnosis Date Noted  . Diabetes mellitus type 2 in obese (HCC) 12/31/2016  . Epigastric pain 08/13/2011  . DM2 (diabetes mellitus, type 2) (HCC) 08/12/2011  . Obesity 08/12/2011  . Infertility, female 08/12/2011  . Irregular periods 08/12/2011    Past Surgical History:  Procedure Laterality Date  . NO PAST SURGERIES       OB History    Gravida  0   Para  0   Term  0   Preterm      AB  0   Living  0     SAB  0   TAB      Ectopic      Multiple      Live Births               Home Medications    Prior to Admission medications   Medication Sig Start Date End Date Taking? Authorizing Provider  albuterol (PROVENTIL HFA;VENTOLIN HFA) 108 (90 Base) MCG/ACT inhaler Inhale 2 puffs into the lungs every 6 (six) hours as needed for wheezing or shortness of breath. 12/29/16   Lizbeth Bark, FNP  azithromycin (ZITHROMAX) 250 MG tablet DAY ONE: TAKE TWO TABLE BY MOUTH ONCE. THEN TAKE ONE TABLET BY MOUTH DAILY. 12/29/16   Lizbeth Bark, FNP  cyclobenzaprine (FLEXERIL) 10 MG tablet Take 1 tablet (10 mg total) by mouth 3 (three) times daily as needed for muscle  spasms. 04/02/16   Lizbeth Bark, FNP  fluticasone (FLONASE) 50 MCG/ACT nasal spray Place 2 sprays into both nostrils daily. 12/29/16   Lizbeth Bark, FNP  glipiZIDE (GLUCOTROL) 5 MG tablet Take 1 tablet (5 mg total) by mouth daily before breakfast. 12/29/16   Arrie Senate R, FNP  glucose blood test strip Use as instructed 12/31/16   Lizbeth Bark, FNP  hydrochlorothiazide (HYDRODIURIL) 12.5 MG tablet Take 2 tablets (25 mg total) by mouth daily. 12/29/16   Lizbeth Bark, FNP  metFORMIN (GLUCOPHAGE) 1000 MG tablet Take 1 tablet (1,000 mg total) by mouth 2 (two) times daily with a meal. 12/29/16   Hairston, Oren Beckmann, FNP  methocarbamol (ROBAXIN) 500 MG tablet Take 1 tablet (500 mg total) by mouth 3 (three) times daily. Prn muscle spasm Patient taking differently: Take 500 mg by mouth 3 (three) times daily as needed for muscle spasms.  05/29/16   Anders Simmonds, PA-C  metroNIDAZOLE (FLAGYL) 500 MG tablet Take 1 tablet (500 mg total) by mouth 2 (two) times daily.  05/10/17   Jacalyn LefevreHaviland, Madelein Mahadeo, MD  naproxen (NAPROSYN) 500 MG tablet Take 1 tablet (500 mg total) by mouth 2 (two) times daily with a meal. Prn pain Patient taking differently: Take 500 mg by mouth 2 (two) times daily as needed for mild pain. Prn pain 05/29/16   Anders SimmondsMcClung, Angela M, PA-C  Phenylephrine-Chlorphen-DM 06-03-13 MG/5ML SYRP Take 5 mLs by mouth every 6 (six) hours as needed. 12/29/16   Lizbeth BarkHairston, Mandesia R, FNP  rosuvastatin (CRESTOR) 20 MG tablet Take 1 tablet (20 mg total) by mouth daily. 12/29/16   Lizbeth BarkHairston, Mandesia R, FNP  sulfamethoxazole-trimethoprim (BACTRIM DS,SEPTRA DS) 800-160 MG tablet Take 1 tablet by mouth 2 (two) times daily for 7 days. 05/10/17 05/17/17  Jacalyn LefevreHaviland, Charne Mcbrien, MD    Family History Family History  Problem Relation Age of Onset  . Cancer Father   . Heart disease Sister   . Other Neg Hx     Social History Social History   Tobacco Use  . Smoking status: Former Smoker     Packs/day: 0.25    Types: Cigarettes    Last attempt to quit: 04/20/2014    Years since quitting: 3.0  . Smokeless tobacco: Never Used  Substance Use Topics  . Alcohol use: No    Alcohol/week: 0.0 oz  . Drug use: Yes    Types: Marijuana    Comment: last use 2 days ago     Allergies   Patient has no known allergies.   Review of Systems Review of Systems  Cardiovascular: Positive for chest pain.  Genitourinary: Positive for vaginal discharge.  All other systems reviewed and are negative.    Physical Exam Updated Vital Signs BP (!) 184/87   Pulse 84   Temp 98.8 F (37.1 C)   Resp 18   Ht 5\' 7"  (1.702 m)   Wt (!) 181.4 kg (400 lb)   LMP 04/19/2017   SpO2 99%   BMI 62.65 kg/m   Physical Exam  Constitutional: She is oriented to person, place, and time. She appears well-developed and well-nourished.  HENT:  Head: Normocephalic and atraumatic.  Eyes: Pupils are equal, round, and reactive to light. EOM are normal.  Neck: Normal range of motion. Neck supple.  Cardiovascular: Normal rate, regular rhythm, intact distal pulses and normal pulses.  Pulmonary/Chest: Effort normal and breath sounds normal.  Abdominal: Soft. Bowel sounds are normal.  Genitourinary: Uterus normal. Cervix exhibits discharge. Right adnexum displays no tenderness. Left adnexum displays no tenderness. Vaginal discharge found.  Musculoskeletal: Normal range of motion.       Right lower leg: Normal.       Left lower leg: Normal.  Neurological: She is alert and oriented to person, place, and time.  Skin: Skin is warm and dry. Capillary refill takes less than 2 seconds.  Psychiatric: She has a normal mood and affect. Her behavior is normal.  Nursing note and vitals reviewed.    ED Treatments / Results  Labs (all labs ordered are listed, but only abnormal results are displayed) Labs Reviewed  WET PREP, GENITAL - Abnormal; Notable for the following components:      Result Value   Clue Cells Wet  Prep HPF POC PRESENT (*)    WBC, Wet Prep HPF POC MANY (*)    All other components within normal limits  BASIC METABOLIC PANEL - Abnormal; Notable for the following components:   Glucose, Bld 124 (*)    All other components within normal limits  CBC WITH DIFFERENTIAL/PLATELET - Abnormal;  Notable for the following components:   Hemoglobin 11.1 (*)    HCT 35.9 (*)    MCV 71.7 (*)    MCH 22.2 (*)    RDW 18.2 (*)    Platelets 422 (*)    All other components within normal limits  URINALYSIS, ROUTINE W REFLEX MICROSCOPIC - Abnormal; Notable for the following components:   APPearance CLOUDY (*)    Ketones, ur 5 (*)    Leukocytes, UA LARGE (*)    Bacteria, UA FEW (*)    Squamous Epithelial / LPF 6-30 (*)    All other components within normal limits  TROPONIN I  I-STAT TROPONIN, ED  I-STAT BETA HCG BLOOD, ED ([facility], [facility], [facility] ONLY)  I-STAT TROPONIN, ED  GC/CHLAMYDIA PROBE AMP ([place]) NOT AT [facility]    EKG EKG Interpretation  Date/Time:  Monday May 10 2017 14:15:28 EDT Ventricular Rate:  90 PR Interval:  144 QRS Duration: 74 QT Interval:  352 QTC Calculation: 430 R Axis:   84 Text Interpretation:  Normal sinus rhythm T wave abnormality, consider inferolateral ischemia Abnormal ECG t wave inversions in V3-V6 new from november 2018 t wave inversions inferiorally are old. Confirmed by Jacalyn Lefevre [phone]) on 05/10/2017 7:09:11 PM   Radiology Dg Chest 2 View  Result Date: 05/10/2017 CLINICAL DATA:  Chest pain for 1 day.  Hypertension. EXAM: CHEST - 2 VIEW COMPARISON:  12/29/2016 FINDINGS: The heart size and mediastinal contours are within normal limits. Both lungs are clear. The visualized skeletal structures are unremarkable. IMPRESSION: Negative.  No active cardiopulmonary disease. Electronically Signed   By: Myles Rosenthal M.D.   On: 05/10/2017 18:30    Procedures Procedures (including critical care time)  Medications Ordered in ED Medications  cefTRIAXone (ROCEPHIN) injection  250 mg (has no administration in time range)  azithromycin (ZITHROMAX) tablet 1,000 mg (has no administration in time range)  metroNIDAZOLE (FLAGYL) tablet 500 mg (has no administration in time range)  lidocaine (PF) (XYLOCAINE) 1 % injection (has no administration in time range)  acetaminophen (TYLENOL) tablet 650 mg (650 mg Oral Given 05/10/17 1700)     Initial Impression / Assessment and Plan / ED Course  I have reviewed the triage vital signs and the nursing notes.  Pertinent labs & imaging results that were available during my care of the patient were reviewed by me and considered in my medical decision making (see chart for details).    Pt treated for STI with rocephin and zithromax.  She was also given flagyl for BV.  She is encouraged to f/u with pcp and to return if worse.  Use condoms when having sex.  Final Clinical Impressions(s) / ED Diagnoses   Final diagnoses:  Chest wall pain  Bacterial vaginosis  Acute cystitis without hematuria    ED Discharge Orders        Ordered    metroNIDAZOLE (FLAGYL) 500 MG tablet  2 times daily     05/10/17 2158    sulfamethoxazole-trimethoprim (BACTRIM DS,SEPTRA DS) 800-160 MG tablet  2 times daily     05/10/17 2158       Jacalyn Lefevre, MD 05/10/17 2201

## 2017-05-10 NOTE — ED Notes (Signed)
Pt states that she no longer has a headache

## 2017-05-10 NOTE — ED Provider Notes (Signed)
Patient placed in Quick Look pathway, seen and evaluated   Chief Complaint: Chest pain, hypertension, vaginal itching.  HPI:   32 y.o. F with past medical history of diabetes, hypertension who presents for evaluation of multiple complaints today.  Patient reports that she has been having vaginal itching and irritation for the last 2 days.  No rash, vaginal discharge, vaginal odor.  Currently sexually active with one partner.  They do not use protection.  Patient also reports that her blood pressure has been high.  Reports that she has been taking her medications but cannot recall the name.  Reports that she had some blurry vision today.  Patient reports that she is also having chest pain currently.  Patient reports that she has had intermittent sharp chest pain that has been ongoing for "awhile."  Patient is a current smoker.  Denies any cocaine, heroin, marijuana use.  Denies any personal cardiac history.  Denies any family cardiac history.  ROS: CP, Vaginal itching, Blurry vision   Physical Exam:   Gen: No distress  Neuro: Awake and Alert  Skin: Warm  Neuro:   Cranial nerves III-XII intact  Follows commands, Moves all extremities   5/5 strength to BUE and BLE   Sensation intact throughout all major nerve distributions  Normal finger to nose.  No dysdiadochokinesia.  No pronator drift.  No gait abnormalities   No slurred speech. No facial droop.    Initiation of care has begun. The patient has been counseled on the process, plan, and necessity for staying for the completion/evaluation, and the remainder of the medical screening examination    Maxwell CaulLayden, Eulala Newcombe A, PA-C 05/10/17 1429    Melene PlanFloyd, Dan, DO 05/11/17 [id number]

## 2017-05-10 NOTE — ED Triage Notes (Addendum)
Pt reports vaginal irritation, itching since Friday. Denies odor or d/c. Denies urinary sx. Is sexually active, does not use protection. Has hx of HTN, her BP was been high, she is taking BPs meds, but her BP is still high, is 167/101. Reports blurry vision, today, reports intermittent sharp CP for "awhile". Also her left arm feels numb. No neuro deficits.

## 2017-05-10 NOTE — ED Notes (Signed)
Pt given bus pass for home d/c

## 2017-05-11 LAB — GC/CHLAMYDIA PROBE AMP (~~LOC~~) NOT AT ARMC
CHLAMYDIA, DNA PROBE: NEGATIVE
NEISSERIA GONORRHEA: NEGATIVE

## 2017-05-20 ENCOUNTER — Telehealth (HOSPITAL_COMMUNITY): Payer: Self-pay

## 2017-05-20 ENCOUNTER — Ambulatory Visit (HOSPITAL_COMMUNITY)
Admission: EM | Admit: 2017-05-20 | Discharge: 2017-05-20 | Disposition: A | Discharge status: Home / Self Care | Payer: Self-pay | Attending: Family Medicine | Admitting: Family Medicine

## 2017-05-20 ENCOUNTER — Other Ambulatory Visit: Payer: Self-pay

## 2017-05-20 DIAGNOSIS — N760 Acute vaginitis: Secondary | ICD-10-CM

## 2017-05-20 LAB — POCT URINALYSIS DIP (DEVICE)
Bilirubin Urine: NEGATIVE
Glucose, UA: NEGATIVE mg/dL
Ketones, ur: NEGATIVE mg/dL
Nitrite: NEGATIVE
Protein, ur: 30 mg/dL — AB
Specific Gravity, Urine: >=1.03 (ref 1.005–1.030)
Urobilinogen, UA: 1 mg/dL (ref 0.0–1.0)
pH: 6 (ref 5.0–8.0)

## 2017-05-20 LAB — POCT PREGNANCY, URINE: Preg Test, Ur: NEGATIVE

## 2017-05-20 MED ORDER — FLUCONAZOLE 200 MG PO TABS: 200.0000 mg | ORAL_TABLET | Freq: Every day | ORAL | 1 refills | Status: AC

## 2017-05-20 MED ORDER — MICONAZOLE NITRATE 1200 & 2 MG & % VA KIT: 1.0000 | PACK | Freq: Once | VAGINAL | 0 refills | Status: AC

## 2017-05-20 MED ORDER — FLUCONAZOLE 200 MG PO TABS: 200.0000 mg | ORAL_TABLET | Freq: Every day | ORAL | 1 refills | Status: DC

## 2017-05-20 NOTE — ED Triage Notes (Signed)
C/o vaginal irritation with itching and abdominal pressure. Currently is taking Atb for UTI and BV, states "not working".

## 2017-05-20 NOTE — ED Provider Notes (Signed)
[facility]    CSN: [id number] Arrival date & time: 05/20/17  1142     History   Chief Complaint Chief Complaint  Patient presents with  . Vaginal Itching    HPI Lisa Cooley is a 32 y.o. female.   Patient was treated last week for BV and UTI and took Flagyl and a sulfa antibiotic.  Today she is complaining of some itching and irritation which sounds like possible monilia vaginitis especially given history of antibiotic treatment HPI  Past Medical History:  Diagnosis Date  . Diabetes mellitus    Dx in [place] but not on meds  . Headache(784.0)   . Hypertension   . Obese     Patient Active Problem List   Diagnosis Date Noted  . Diabetes mellitus type 2 in obese (HCC) 12/31/2016  . Epigastric pain 08/13/2011  . DM2 (diabetes mellitus, type 2) (HCC) 08/12/2011  . Obesity 08/12/2011  . Infertility, female 08/12/2011  . Irregular periods 08/12/2011    Past Surgical History:  Procedure Laterality Date  . NO PAST SURGERIES      OB History    Gravida  0   Para  0   Term  0   Preterm      AB  0   Living  0     SAB  0   TAB      Ectopic      Multiple      Live Births               Home Medications    Prior to Admission medications   Medication Sig Start Date End Date Taking? Authorizing Provider  albuterol (PROVENTIL HFA;VENTOLIN HFA) 108 (90 Base) MCG/ACT inhaler Inhale 2 puffs into the lungs every 6 (six) hours as needed for wheezing or shortness of breath. 12/29/16   Lizbeth Bark, FNP  azithromycin (ZITHROMAX) 250 MG tablet DAY ONE: TAKE TWO TABLE BY MOUTH ONCE. THEN TAKE ONE TABLET BY MOUTH DAILY. 12/29/16   Lizbeth Bark, FNP  cyclobenzaprine (FLEXERIL) 10 MG tablet Take 1 tablet (10 mg total) by mouth 3 (three) times daily as needed for muscle spasms. 04/02/16   Lizbeth Bark, FNP  fluticasone (FLONASE) 50 MCG/ACT nasal spray Place 2 sprays into both nostrils daily. 12/29/16   Lizbeth Bark,  FNP  glipiZIDE (GLUCOTROL) 5 MG tablet Take 1 tablet (5 mg total) by mouth daily before breakfast. 12/29/16   Arrie Senate R, FNP  glucose blood test strip Use as instructed 12/31/16   Lizbeth Bark, FNP  hydrochlorothiazide (HYDRODIURIL) 12.5 MG tablet Take 2 tablets (25 mg total) by mouth daily. 12/29/16   Lizbeth Bark, FNP  metFORMIN (GLUCOPHAGE) 1000 MG tablet Take 1 tablet (1,000 mg total) by mouth 2 (two) times daily with a meal. 12/29/16   Hairston, Oren Beckmann, FNP  methocarbamol (ROBAXIN) 500 MG tablet Take 1 tablet (500 mg total) by mouth 3 (three) times daily. Prn muscle spasm Patient taking differently: Take 500 mg by mouth 3 (three) times daily as needed for muscle spasms.  05/29/16   Anders Simmonds, PA-C  metroNIDAZOLE (FLAGYL) 500 MG tablet Take 1 tablet (500 mg total) by mouth 2 (two) times daily. 05/10/17   Jacalyn Lefevre, MD  naproxen (NAPROSYN) 500 MG tablet Take 1 tablet (500 mg total) by mouth 2 (two) times daily with a meal. Prn pain Patient taking differently: Take 500 mg by mouth 2 (two) times daily as needed for mild  pain. Prn pain 05/29/16   Anders SimmondsMcClung, Angela M, PA-C  Phenylephrine-Chlorphen-DM 06-03-13 MG/5ML SYRP Take 5 mLs by mouth every 6 (six) hours as needed. 12/29/16   Lizbeth BarkHairston, Mandesia R, FNP  rosuvastatin (CRESTOR) 20 MG tablet Take 1 tablet (20 mg total) by mouth daily. 12/29/16   Lizbeth BarkHairston, Mandesia R, FNP    Family History Family History  Problem Relation Age of Onset  . Cancer Father   . Heart disease Sister   . Other Neg Hx     Social History Social History   Tobacco Use  . Smoking status: Former Smoker    Packs/day: 0.25    Types: Cigarettes    Last attempt to quit: 04/20/2014    Years since quitting: 3.0  . Smokeless tobacco: Never Used  Substance Use Topics  . Alcohol use: No    Alcohol/week: 0.0 oz  . Drug use: Yes    Types: Marijuana    Comment: last use 2 days ago     Allergies   Patient has no known  allergies.   Review of Systems Review of Systems  Constitutional: Negative for chills and fever.  HENT: Negative for ear pain and sore throat.   Eyes: Negative for pain and visual disturbance.  Respiratory: Negative for cough and shortness of breath.   Cardiovascular: Negative for chest pain and palpitations.  Gastrointestinal: Negative for abdominal pain and vomiting.  Genitourinary: Positive for vaginal discharge. Negative for dysuria and hematuria.  Musculoskeletal: Negative for arthralgias and back pain.  Skin: Negative for color change and rash.  Neurological: Negative for seizures and syncope.  All other systems reviewed and are negative.    Physical Exam Triage Vital Signs ED Triage Vitals  Enc Vitals Group     BP 05/20/17 1243 (!) 149/85     Pulse Rate 05/20/17 1243 87     Resp --      Temp 05/20/17 1243 98.2 F (36.8 C)     Temp Source 05/20/17 1243 Oral     SpO2 05/20/17 1243 97 %     Weight --      Height --      Head Circumference --      Peak Flow --      Pain Score 05/20/17 1240 8     Pain Loc --      Pain Edu? --      Excl. in GC? --    No data found.  Updated Vital Signs BP (!) 149/85 (BP Location: Right Wrist)   Pulse 87   Temp 98.2 F (36.8 C) (Oral)   LMP 04/19/2017   SpO2 97%   Visual Acuity Right Eye Distance:   Left Eye Distance:   Bilateral Distance:    Right Eye Near:   Left Eye Near:    Bilateral Near:     Physical Exam  Constitutional: She appears well-developed and well-nourished.  Genitourinary: No vaginal discharge found.     UC Treatments / Results  Labs (all labs ordered are listed, but only abnormal results are displayed) Labs Reviewed  POCT URINALYSIS DIP (DEVICE) - Abnormal; Notable for the following components:      Result Value   Hgb urine dipstick TRACE (*)    Protein, ur 30 (*)    Leukocytes, UA TRACE (*)    All other components within normal limits  POCT PREGNANCY, URINE    EKG None Radiology No  results found.  Procedures Procedures (including critical care time)  Medications Ordered in UC Medications -  No data to display   Initial Impression / Assessment and Plan / UC Course  I have reviewed the triage vital signs and the nursing notes.  Pertinent labs & imaging results that were available during my care of the patient were reviewed by me and considered in my medical decision making (see chart for details).     Vaginal irritation, likely secondary to monilia  Final Clinical Impressions(s) / UC Diagnoses   Final diagnoses:  Vaginitis and vulvovaginitis    ED Discharge Orders    None       Controlled Substance Prescriptions [place] Controlled Substance Registry consulted? No   Frederica Kuster, MD 05/20/17 1318

## 2017-07-01 ENCOUNTER — Encounter (HOSPITAL_COMMUNITY): Payer: Self-pay | Admitting: Family Medicine

## 2017-07-01 ENCOUNTER — Ambulatory Visit (HOSPITAL_COMMUNITY)
Admission: EM | Admit: 2017-07-01 | Discharge: 2017-07-01 | Disposition: A | Discharge status: Home / Self Care | Payer: Medicaid Other | Attending: Family Medicine | Admitting: Family Medicine

## 2017-07-01 DIAGNOSIS — R0981 Nasal congestion: Secondary | ICD-10-CM

## 2017-07-01 DIAGNOSIS — J069 Acute upper respiratory infection, unspecified: Secondary | ICD-10-CM

## 2017-07-01 DIAGNOSIS — R05 Cough: Secondary | ICD-10-CM

## 2017-07-01 MED ORDER — METHYLPREDNISOLONE SODIUM SUCC 125 MG IJ SOLR
INTRAMUSCULAR | Status: AC
  Filled 2017-07-01: qty 2

## 2017-07-01 MED ORDER — METHYLPREDNISOLONE SODIUM SUCC 125 MG IJ SOLR
80.0000 mg | Freq: Once | INTRAMUSCULAR | Status: AC
  Administered 2017-07-01: 80 mg via INTRAMUSCULAR

## 2017-07-01 NOTE — ED Provider Notes (Signed)
[facility]    CSN: [id number] Arrival date & time: 07/01/17  1023     History   Chief Complaint Chief Complaint  Patient presents with  . Nasal Congestion  . Cough    HPI Lisa Cooley is a 32 y.o. female.  Patient has history of allergic rhinitis.  Complains of choking sensation with mucus in the back of her throat and cough which is nonproductive.  She has been using an unknown allergy medicine.  She tells me that Flonase makes her worse.  Also has been taking some amoxicillin which she had leftover from previous encounter.   HPI  Past Medical History:  Diagnosis Date  . Diabetes mellitus    Dx in [place] but not on meds  . Headache(784.0)   . Hypertension   . Obese     Patient Active Problem List   Diagnosis Date Noted  . Diabetes mellitus type 2 in obese (HCC) 12/31/2016  . Epigastric pain 08/13/2011  . DM2 (diabetes mellitus, type 2) (HCC) 08/12/2011  . Obesity 08/12/2011  . Infertility, female 08/12/2011  . Irregular periods 08/12/2011    Past Surgical History:  Procedure Laterality Date  . NO PAST SURGERIES      OB History    Gravida  0   Para  0   Term  0   Preterm      AB  0   Living  0     SAB  0   TAB      Ectopic      Multiple      Live Births               Home Medications    Prior to Admission medications   Medication Sig Start Date End Date Taking? Authorizing Provider  albuterol (PROVENTIL HFA;VENTOLIN HFA) 108 (90 Base) MCG/ACT inhaler Inhale 2 puffs into the lungs every 6 (six) hours as needed for wheezing or shortness of breath. 12/29/16   Lizbeth Bark, FNP  fluticasone (FLONASE) 50 MCG/ACT nasal spray Place 2 sprays into both nostrils daily. 12/29/16   Lizbeth Bark, FNP  glipiZIDE (GLUCOTROL) 5 MG tablet Take 1 tablet (5 mg total) by mouth daily before breakfast. 12/29/16   Arrie Senate R, FNP  glucose blood test strip Use as instructed 12/31/16   Lizbeth Bark, FNP    hydrochlorothiazide (HYDRODIURIL) 12.5 MG tablet Take 2 tablets (25 mg total) by mouth daily. 12/29/16   Lizbeth Bark, FNP  metFORMIN (GLUCOPHAGE) 1000 MG tablet Take 1 tablet (1,000 mg total) by mouth 2 (two) times daily with a meal. 12/29/16   Hairston, Oren Beckmann, FNP  methocarbamol (ROBAXIN) 500 MG tablet Take 1 tablet (500 mg total) by mouth 3 (three) times daily. Prn muscle spasm Patient taking differently: Take 500 mg by mouth 3 (three) times daily as needed for muscle spasms.  05/29/16   Anders Simmonds, PA-C  naproxen (NAPROSYN) 500 MG tablet Take 1 tablet (500 mg total) by mouth 2 (two) times daily with a meal. Prn pain Patient taking differently: Take 500 mg by mouth 2 (two) times daily as needed for mild pain. Prn pain 05/29/16   Anders Simmonds, PA-C  rosuvastatin (CRESTOR) 20 MG tablet Take 1 tablet (20 mg total) by mouth daily. 12/29/16   Lizbeth Bark, FNP    Family History Family History  Problem Relation Age of Onset  . Cancer Father   . Heart disease Sister   . Other  Neg Hx     Social History Social History   Tobacco Use  . Smoking status: Former Smoker    Packs/day: 0.25    Types: Cigarettes    Last attempt to quit: 04/20/2014    Years since quitting: 3.2  . Smokeless tobacco: Never Used  Substance Use Topics  . Alcohol use: No    Alcohol/week: 0.0 oz  . Drug use: Yes    Types: Marijuana    Comment: last use 2 days ago     Allergies   Patient has no known allergies.   Review of Systems Review of Systems  Constitutional: Negative.   HENT: Positive for congestion, postnasal drip and rhinorrhea.   Respiratory: Negative.   Cardiovascular: Negative.      Physical Exam Triage Vital Signs ED Triage Vitals  Enc Vitals Group     BP 07/01/17 1101 (!) 145/79     Pulse Rate 07/01/17 1101 90     Resp 07/01/17 1101 18     Temp 07/01/17 1101 98 F (36.7 C)     Temp src --      SpO2 07/01/17 1101 97 %     Weight --      Height --       Head Circumference --      Peak Flow --      Pain Score 07/01/17 1059 0     Pain Loc --      Pain Edu? --      Excl. in GC? --    No data found.  Updated Vital Signs BP (!) 145/79   Pulse 90   Temp 98 F (36.7 C)   Resp 18   LMP 06/23/2017   SpO2 97%   Visual Acuity Right Eye Distance:   Left Eye Distance:   Bilateral Distance:    Right Eye Near:   Left Eye Near:    Bilateral Near:     Physical Exam  Constitutional: She appears well-developed and well-nourished.  HENT:  Head: Normocephalic.  Right Ear: External ear normal.  Left Ear: External ear normal.  Nose: Nose normal.  Mouth/Throat: Oropharynx is clear and moist.  Pulmonary/Chest: Effort normal and breath sounds normal.     UC Treatments / Results  Labs (all labs ordered are listed, but only abnormal results are displayed) Labs Reviewed - No data to display  EKG None  Radiology No results found.  Procedures Procedures (including critical care time)  Medications Ordered in UC Medications - No data to display  Initial Impression / Assessment and Plan / UC Course  I have reviewed the triage vital signs and the nursing notes.  Pertinent labs & imaging results that were available during my care of the patient were reviewed by me and considered in my medical decision making (see chart for details).     Allergic rhinitis.  I have asked her to continue Flonase.  We will add Mucinex and also injection of steroid today. Final Clinical Impressions(s) / UC Diagnoses   Final diagnoses:  None   Discharge Instructions   None    ED Prescriptions    None     Controlled Substance Prescriptions [place] Controlled Substance Registry consulted? No   Frederica Kuster, MD 07/01/17 [phone]

## 2017-07-01 NOTE — ED Triage Notes (Signed)
Pt here for 4 or 5 days of nasal congestion, coughing, and loss of taste. She has been taking allergy meds.

## 2017-07-13 DIAGNOSIS — E1169 Type 2 diabetes mellitus with other specified complication: Secondary | ICD-10-CM

## 2017-09-17 ENCOUNTER — Other Ambulatory Visit (HOSPITAL_COMMUNITY)
Admission: RE | Admit: 2017-09-17 | Discharge: 2017-09-17 | Disposition: A | Discharge status: Home / Self Care | Payer: Medicaid Other | Source: Ambulatory Visit | Attending: Family Medicine | Admitting: Family Medicine

## 2017-09-17 ENCOUNTER — Ambulatory Visit (INDEPENDENT_AMBULATORY_CARE_PROVIDER_SITE_OTHER): Payer: Self-pay | Admitting: Family Medicine

## 2017-09-17 ENCOUNTER — Encounter: Payer: Self-pay | Admitting: Family Medicine

## 2017-09-17 ENCOUNTER — Other Ambulatory Visit: Payer: Self-pay

## 2017-09-17 ENCOUNTER — Ambulatory Visit: Payer: Medicaid Other | Admitting: Family Medicine

## 2017-09-17 VITALS — BP 134/72 | HR 87 | Temp 98.1°F | Ht 67.0 in | Wt 375.6 lb

## 2017-09-17 DIAGNOSIS — E119 Type 2 diabetes mellitus without complications: Secondary | ICD-10-CM

## 2017-09-17 DIAGNOSIS — Z7251 High risk heterosexual behavior: Secondary | ICD-10-CM

## 2017-09-17 DIAGNOSIS — M5410 Radiculopathy, site unspecified: Secondary | ICD-10-CM

## 2017-09-17 DIAGNOSIS — Z113 Encounter for screening for infections with a predominantly sexual mode of transmission: Secondary | ICD-10-CM | POA: Insufficient documentation

## 2017-09-17 DIAGNOSIS — Z0000 Encounter for general adult medical examination without abnormal findings: Secondary | ICD-10-CM

## 2017-09-17 LAB — POCT GLYCOSYLATED HEMOGLOBIN (HGB A1C): HbA1c, POC (controlled diabetic range): 8.2 % — AB (ref 0.0–7.0)

## 2017-09-17 MED ORDER — GABAPENTIN 300 MG PO CAPS: 300.0000 mg | ORAL_CAPSULE | Freq: Three times a day (TID) | ORAL | 2 refills | Status: DC | PRN

## 2017-09-17 MED ORDER — GLIPIZIDE 5 MG PO TABS: 5.0000 mg | ORAL_TABLET | Freq: Every day | ORAL | 2 refills | Status: DC

## 2017-09-17 NOTE — Assessment & Plan Note (Signed)
Discussed with patient the need for a Pap smear today, but she would like to defer that to her next visit.  Encouraged patient to set up an appointment in about 1 month to continue talking about her mood, lifestyle modifications for her diabetes, and to get her Pap smear.
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
## 2017-09-17 NOTE — Assessment & Plan Note (Signed)
Numbness and weakness are concerning for a radicular pain, possibly due to lumbar osteoarthritis.  We will try 200 mg gabapentin 3 times daily as needed.  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

## 2017-09-17 NOTE — Assessment & Plan Note (Signed)
Discussed healthy eating habits and the importance of losing just 5 to 10% of her body weight.  Connected her unhealthy weight to her diabetes diagnosis as well as to her arthritis and her infertility.  Gave patient multiple handouts for healthier eating tips.  Also referred her to Dr. Gerilyn PilgrimSykes for consultation.

## 2017-09-17 NOTE — Assessment & Plan Note (Signed)
A1c is 8.2 today, down from previous A1c of 9.9 eight months ago; however still above goal of less than 7.  Will continue metformin 1000 mg twice daily and add glipizide 5 mg daily.  Patient has been prescribed this medication in the past and reported no difficulties with it.  Will obtain urine microalbumin today.  Would like to see her back in 1 month to follow-up on how she is doing with glipizide and to continue counseling regarding lifestyle changes with diabetes.

## 2017-09-17 NOTE — Assessment & Plan Note (Addendum)
Will do urine test for gonorrhea and chlamydia today.  We will also check for HIV and RPR.  Counseled patient that although her LDL was high at her last check, we will not start a statin at this time since she is having unprotected sex, this medication would be harmful for her fetus if she were to become pregnant.

## 2017-09-17 NOTE — Patient Instructions (Addendum)
It was nice meeting you today Ms. Kaltenbach!  Please start glipizide, one pill each morning with food, for your diabetes.  Continue taking metformin.  Please also take gabapentin for your leg pain and numbness.  I've included a packet of information with tips for healthy eating.  Please also call our nutritionist when you're ready.  I will call you with any abnormal results from our tests today.  Please return to clinic in about one month for your pap smear and to continue discussing your health.  If you have any questions or concerns, please feel free to call the clinic.   Be well,  Dr. Frances FurbishWinfrey  Diabetes Mellitus and Nutrition When you have diabetes (diabetes mellitus), it is very important to have healthy eating habits because your blood sugar (glucose) levels are greatly affected by what you eat and drink. Eating healthy foods in the appropriate amounts, at about the same times every day, can help you:  Control your blood glucose.  Lower your risk of heart disease.  Improve your blood pressure.  Reach or maintain a healthy weight.  Every person with diabetes is different, and each person has different needs for a meal plan. Your health care provider may recommend that you work with a diet and nutrition specialist (dietitian) to make a meal plan that is best for you. Your meal plan may vary depending on factors such as:  The calories you need.  The medicines you take.  Your weight.  Your blood glucose, blood pressure, and cholesterol levels.  Your activity level.  Other health conditions you have, such as heart or kidney disease.  How do carbohydrates affect me? Carbohydrates affect your blood glucose level more than any other type of food. Eating carbohydrates naturally increases the amount of glucose in your blood. Carbohydrate counting is a method for keeping track of how many carbohydrates you eat. Counting carbohydrates is important to keep your blood glucose at a  healthy level, especially if you use insulin or take certain oral diabetes medicines. It is important to know how many carbohydrates you can safely have in each meal. This is different for every person. Your dietitian can help you calculate how many carbohydrates you should have at each meal and for snack. Foods that contain carbohydrates include:  Bread, cereal, rice, pasta, and crackers.  Potatoes and corn.  Peas, beans, and lentils.  Milk and yogurt.  Fruit and juice.  Desserts, such as cakes, cookies, ice cream, and candy.  How does alcohol affect me? Alcohol can cause a sudden decrease in blood glucose (hypoglycemia), especially if you use insulin or take certain oral diabetes medicines. Hypoglycemia can be a life-threatening condition. Symptoms of hypoglycemia (sleepiness, dizziness, and confusion) are similar to symptoms of having too much alcohol. If your health care provider says that alcohol is safe for you, follow these guidelines:  Limit alcohol intake to no more than 1 drink per day for nonpregnant women and 2 drinks per day for men. One drink equals 12 oz of beer, 5 oz of wine, or 1 oz of hard liquor.  Do not drink on an empty stomach.  Keep yourself hydrated with water, diet soda, or unsweetened iced tea.  Keep in mind that regular soda, juice, and other mixers may contain a lot of sugar and must be counted as carbohydrates.  What are tips for following this plan? Reading food labels  Start by checking the serving size on the label. The amount of calories, carbohydrates, fats, and other  nutrients listed on the label are based on one serving of the food. Many foods contain more than one serving per package.  Check the total grams (g) of carbohydrates in one serving. You can calculate the number of servings of carbohydrates in one serving by dividing the total carbohydrates by 15. For example, if a food has 30 g of total carbohydrates, it would be equal to 2 servings of  carbohydrates.  Check the number of grams (g) of saturated and trans fats in one serving. Choose foods that have low or no amount of these fats.  Check the number of milligrams (mg) of sodium in one serving. Most people should limit total sodium intake to less than 2,300 mg per day.  Always check the nutrition information of foods labeled as "low-fat" or "nonfat". These foods may be higher in added sugar or refined carbohydrates and should be avoided.  Talk to your dietitian to identify your daily goals for nutrients listed on the label. Shopping  Avoid buying canned, premade, or processed foods. These foods tend to be high in fat, sodium, and added sugar.  Shop around the outside edge of the grocery store. This includes fresh fruits and vegetables, bulk grains, fresh meats, and fresh dairy. Cooking  Use low-heat cooking methods, such as baking, instead of high-heat cooking methods like deep frying.  Cook using healthy oils, such as olive, canola, or sunflower oil.  Avoid cooking with butter, cream, or high-fat meats. Meal planning  Eat meals and snacks regularly, preferably at the same times every day. Avoid going long periods of time without eating.  Eat foods high in fiber, such as fresh fruits, vegetables, beans, and whole grains. Talk to your dietitian about how many servings of carbohydrates you can eat at each meal.  Eat 4-6 ounces of lean protein each day, such as lean meat, chicken, fish, eggs, or tofu. 1 ounce is equal to 1 ounce of meat, chicken, or fish, 1 egg, or 1/4 cup of tofu.  Eat some foods each day that contain healthy fats, such as avocado, nuts, seeds, and fish. Lifestyle   Check your blood glucose regularly.  Exercise at least 30 minutes 5 or more days each week, or as told by your health care provider.  Take medicines as told by your health care provider.  Do not use any products that contain nicotine or tobacco, such as cigarettes and e-cigarettes. If  you need help quitting, ask your health care provider.  Work with a Veterinary surgeoncounselor or diabetes educator to identify strategies to manage stress and any emotional and social challenges. What are some questions to ask my health care provider?  Do I need to meet with a diabetes educator?  Do I need to meet with a dietitian?  What number can I call if I have questions?  When are the best times to check my blood glucose? Where to find more information:  American Diabetes Association: diabetes.org/food-and-fitness/food  Academy of Nutrition and Dietetics: https://www.vargas.com/www.eatright.org/resources/health/diseases-and-conditions/diabetes  [employer] Institute of Diabetes and Digestive and Kidney Diseases (NIH): FindJewelers.czwww.niddk.nih.gov/health-information/diabetes/overview/diet-eating-physical-activity Summary  A healthy meal plan will help you control your blood glucose and maintain a healthy lifestyle.  Working with a diet and nutrition specialist (dietitian) can help you make a meal plan that is best for you.  Keep in mind that carbohydrates and alcohol have immediate effects on your blood glucose levels. It is important to count carbohydrates and to use alcohol carefully. This information is not intended to replace advice given to you  by your health care provider. Make sure you discuss any questions you have with your health care provider. Document Released: 10/16/2004 Document Revised: 02/24/2016 Document Reviewed: 02/24/2016 Elsevier Interactive Patient Education  2018 [employer].  Heart-Healthy Eating Plan Heart-healthy meal planning includes:  Limiting unhealthy fats.  Increasing healthy fats.  Making other small dietary changes.  You may need to talk with your doctor or a diet specialist (dietitian) to create an eating plan that is right for you. What types of fat should I choose?  Choose healthy fats. These include olive oil and canola oil, flaxseeds, walnuts, almonds, and seeds.  Eat more omega-3  fats. These include salmon, mackerel, sardines, tuna, flaxseed oil, and ground flaxseeds. Try to eat fish at least twice each week.  Limit saturated fats. ? Saturated fats are often found in animal products, such as meats, butter, and cream. ? Plant sources of saturated fats include palm oil, palm kernel oil, and coconut oil.  Avoid foods with partially hydrogenated oils in them. These include stick margarine, some tub margarines, cookies, crackers, and other baked goods. These contain trans fats. What general guidelines do I need to follow?  Check food labels carefully. Identify foods with trans fats or high amounts of saturated fat.  Fill one half of your plate with vegetables and green salads. Eat 4-5 servings of vegetables per day. A serving of vegetables is: ? 1 cup of raw leafy vegetables. ?  cup of raw or cooked cut-up vegetables. ?  cup of vegetable juice.  Fill one fourth of your plate with whole grains. Look for the word "whole" as the first word in the ingredient list.  Fill one fourth of your plate with lean protein foods.  Eat 4-5 servings of fruit per day. A serving of fruit is: ? One medium whole fruit. ?  cup of dried fruit. ?  cup of fresh, frozen, or canned fruit. ?  cup of 100% fruit juice.  Eat more foods that contain soluble fiber. These include apples, broccoli, carrots, beans, peas, and barley. Try to get 20-30 g of fiber per day.  Eat more home-cooked food. Eat less restaurant, buffet, and fast food.  Limit or avoid alcohol.  Limit foods high in starch and sugar.  Avoid fried foods.  Avoid frying your food. Try baking, boiling, grilling, or broiling it instead. You can also reduce fat by: ? Removing the skin from poultry. ? Removing all visible fats from meats. ? Skimming the fat off of stews, soups, and gravies before serving them. ? Steaming vegetables in water or broth.  Lose weight if you are overweight.  Eat 4-5 servings of nuts, legumes,  and seeds per week: ? One serving of dried beans or legumes equals  cup after being cooked. ? One serving of nuts equals 1 ounces. ? One serving of seeds equals  ounce or one tablespoon.  You may need to keep track of how much salt or sodium you eat. This is especially true if you have high blood pressure. Talk with your doctor or dietitian to get more information. What foods can I eat? Grains Breads, including [place], white, pita, wheat, raisin, rye, oatmeal, and [place]. Tortillas that are neither fried nor made with lard or trans fat. Low-fat rolls, including hotdog and hamburger buns and English muffins. Biscuits. Muffins. Waffles. Pancakes. Light popcorn. Whole-grain cereals. Flatbread. Melba toast. Pretzels. Breadsticks. Rusks. Low-fat snacks. Low-fat crackers, including oyster, saltine, matzo, graham, animal, and rye. Rice and pasta, including brown rice and  pastas that are made with whole wheat. Vegetables All vegetables. Fruits All fruits, but limit coconut. Meats and Other Protein Sources Lean, well-trimmed beef, veal, pork, and lamb. Chicken and [place] without skin. All fish and shellfish. Wild duck, rabbit, pheasant, and venison. Egg whites or low-cholesterol egg substitutes. Dried beans, peas, lentils, and tofu. Seeds and most nuts. Dairy Low-fat or nonfat cheeses, including ricotta, string, and mozzarella. Skim or 1% milk that is liquid, powdered, or evaporated. Buttermilk that is made with low-fat milk. Nonfat or low-fat yogurt. Beverages Mineral water. Diet carbonated beverages. Sweets and Desserts Sherbets and fruit ices. Honey, jam, marmalade, jelly, and syrups. Meringues and gelatins. Pure sugar candy, such as hard candy, jelly beans, gumdrops, mints, marshmallows, and small amounts of dark chocolate. [employer]. Eat all sweets and desserts in moderation. Fats and Oils Nonhydrogenated (trans-free) margarines. Vegetable oils, including soybean, sesame, sunflower,  olive, peanut, safflower, corn, canola, and cottonseed. Salad dressings or mayonnaise made with a vegetable oil. Limit added fats and oils that you use for cooking, baking, salads, and as spreads. Other Cocoa powder. Coffee and tea. All seasonings and condiments. The items listed above may not be a complete list of recommended foods or beverages. Contact your dietitian for more options. What foods are not recommended? Grains Breads that are made with saturated or trans fats, oils, or whole milk. Croissants. Butter rolls. Cheese breads. Sweet rolls. Donuts. Buttered popcorn. Chow mein noodles. High-fat crackers, such as cheese or butter crackers. Meats and Other Protein Sources Fatty meats, such as hotdogs, short ribs, sausage, spareribs, bacon, rib eye roast or steak, and mutton. High-fat deli meats, such as salami and bologna. Caviar. Domestic duck and goose. Organ meats, such as kidney, liver, sweetbreads, and heart. Dairy Cream, sour cream, cream cheese, and creamed cottage cheese. Whole-milk cheeses, including blue (bleu), [address], [place], [place], [address], [place], [address], cheddar, [place], and [place]. Whole or 2% milk that is liquid, evaporated, or condensed. Whole buttermilk. Cream sauce or high-fat cheese sauce. Yogurt that is made from whole milk. Beverages Regular sodas and juice drinks with added sugar. Sweets and Desserts Frosting. Pudding. Cookies. Cakes other than angel food cake. Candy that has milk chocolate or white chocolate, hydrogenated fat, butter, coconut, or unknown ingredients. Buttered syrups. Full-fat ice cream or ice cream drinks. Fats and Oils Gravy that has suet, meat fat, or shortening. Cocoa butter, hydrogenated oils, palm oil, coconut oil, palm kernel oil. These can often be found in baked products, candy, fried foods, nondairy creamers, and whipped toppings. Solid fats and shortenings, including bacon fat, salt pork, lard, and butter. Nondairy cream substitutes,  such as coffee creamers and sour cream substitutes. Salad dressings that are made of unknown oils, cheese, or sour cream. The items listed above may not be a complete list of foods and beverages to avoid. Contact your dietitian for more information. This information is not intended to replace advice given to you by your health care provider. Make sure you discuss any questions you have with your health care provider. Document Released: 07/21/2011 Document Revised: 06/27/2015 Document Reviewed: 07/13/2013 Elsevier Interactive Patient Education  2018 [employer].  Preventing Hypertension Hypertension, commonly called high blood pressure, is when the force of blood pumping through the arteries is too strong. Arteries are blood vessels that carry blood from the heart throughout the body. Over time, hypertension can damage the arteries and decrease blood flow to important parts of the body, including the brain, heart, and kidneys. Often, hypertension does not cause  symptoms until blood pressure is very high. For this reason, it is important to have your blood pressure checked on a regular basis. Hypertension can often be prevented with diet and lifestyle changes. If you already have hypertension, you can control it with diet and lifestyle changes, as well as medicine. What nutrition changes can be made? Maintain a healthy diet. This includes:  Eating less salt (sodium). Ask your health care provider how much sodium is safe for you to have. The general recommendation is to consume less than 1 tsp (2,300 mg) of sodium a day. ? Do not add salt to your food. ? Choose low-sodium options when grocery shopping and eating out.  Limiting fats in your diet. You can do this by eating low-fat or fat-free dairy products and by eating less red meat.  Eating more fruits, vegetables, and whole grains. Make a goal to eat: ? 1-2 cups of fresh fruits and vegetables each day. ? 3-4 servings of whole grains each  day.  Avoiding foods and beverages that have added sugars.  Eating fish that contain healthy fats (omega-3 fatty acids), such as mackerel or salmon.  If you need help putting together a healthy eating plan, try the DASH diet. This diet is high in fruits, vegetables, and whole grains. It is low in sodium, red meat, and added sugars. DASH stands for Dietary Approaches to Stop Hypertension. What lifestyle changes can be made?  Lose weight if you are overweight. Losing just 3?5% of your body weight can help prevent or control hypertension. ? For example, if your present weight is 200 lb (91 kg), a loss of 3-5% of your weight means losing 6-10 lb (2.7-4.5 kg). ? Ask your health care provider to help you with a diet and exercise plan to safely lose weight.  Get enough exercise. Do at least 150 minutes of moderate-intensity exercise each week. ? You could do this in short exercise sessions several times a day, or you could do longer exercise sessions a few times a week. For example, you could take a brisk 10-minute walk or bike ride, 3 times a day, for 5 days a week.  Find ways to reduce stress, such as exercising, meditating, listening to music, or taking a yoga class. If you need help reducing stress, ask your health care provider.  Do not smoke. This includes e-cigarettes. Chemicals in tobacco and nicotine products raise your blood pressure each time you smoke. If you need help quitting, ask your health care provider.  Avoid alcohol. If you drink alcohol, limit alcohol intake to no more than 1 drink a day for nonpregnant women and 2 drinks a day for men. One drink equals 12 oz of beer, 5 oz of wine, or 1 oz of hard liquor. Why are these changes important? Diet and lifestyle changes can help you prevent hypertension, and they may make you feel better overall and improve your quality of life. If you have hypertension, making these changes will help you control it and help prevent major  complications, such as:  Hardening and narrowing of arteries that supply blood to: ? Your heart. This can cause a heart attack. ? Your brain. This can cause a stroke. ? Your kidneys. This can cause kidney failure.  Stress on your heart muscle, which can cause heart failure.  What can I do to lower my risk?  Work with your health care provider to make a hypertension prevention plan that works for you. Follow your plan and keep all follow-up  visits as told by your health care provider.  Learn how to check your blood pressure at home. Make sure that you know your personal target blood pressure, as told by your health care provider. How is this treated? In addition to diet and lifestyle changes, your health care provider may recommend medicines to help lower your blood pressure. You may need to try a few different medicines to find what works best for you. You also may need to take more than one medicine. Take over-the-counter and prescription medicines only as told by your health care provider. Where to find support: Your health care provider can help you prevent hypertension and help you keep your blood pressure at a healthy level. Your local hospital or your community may also provide support services and prevention programs. The American Heart Association offers an online support network at: https://www.lee.net/ Where to find more information: Learn more about hypertension from:  National Heart, Lung, and Blood Institute: https://www.peterson.org/  Centers for Disease Control and Prevention: AboutHD.co.nz  American Academy of Family Physicians: http://familydoctor.org/familydoctor/en/diseases-conditions/high-blood-pressure.printerview.all.html  Learn more about the DASH diet from:  National Heart, Lung, and Blood Institute: WedMap.it  Contact a health care provider if:  You  think you are having a reaction to medicines you have taken.  You have recurrent headaches or feel dizzy.  You have swelling in your ankles.  You have trouble with your vision. Summary  Hypertension often does not cause any symptoms until blood pressure is very high. It is important to get your blood pressure checked regularly.  Diet and lifestyle changes are the most important steps in preventing hypertension.  By keeping your blood pressure in a healthy range, you can prevent complications like heart attack, heart failure, stroke, and kidney failure.  Work with your health care provider to make a hypertension prevention plan that works for you. This information is not intended to replace advice given to you by your health care provider. Make sure you discuss any questions you have with your health care provider. Document Released: 02/03/2015 Document Revised: 09/30/2015 Document Reviewed: 09/30/2015 Elsevier Interactive Patient Education  [employer].

## 2017-09-18 LAB — MICROALBUMIN / CREATININE URINE RATIO
CREATININE, UR: 202.3 mg/dL
MICROALBUM., U, RANDOM: 9.9 ug/mL
Microalb/Creat Ratio: 4.9 mg/g creat (ref 0.0–30.0)

## 2017-09-18 LAB — RPR: RPR Ser Ql: NONREACTIVE

## 2017-09-18 LAB — HIV ANTIBODY (ROUTINE TESTING W REFLEX): HIV Screen 4th Generation wRfx: NONREACTIVE

## 2017-09-20 ENCOUNTER — Encounter: Payer: Self-pay | Admitting: Family Medicine

## 2017-09-20 LAB — URINE CYTOLOGY ANCILLARY ONLY
Chlamydia: NEGATIVE
NEISSERIA GONORRHEA: NEGATIVE
TRICH (WINDOWPATH): NEGATIVE

## 2017-11-09 ENCOUNTER — Other Ambulatory Visit: Payer: Self-pay | Admitting: Family Medicine

## 2017-11-09 ENCOUNTER — Telehealth: Payer: Self-pay | Admitting: Family Medicine

## 2017-11-09 DIAGNOSIS — E119 Type 2 diabetes mellitus without complications: Secondary | ICD-10-CM

## 2017-11-09 MED ORDER — GABAPENTIN 300 MG PO CAPS: 300.0000 mg | ORAL_CAPSULE | Freq: Three times a day (TID) | ORAL | 2 refills | Status: DC | PRN

## 2017-11-09 MED ORDER — GLIPIZIDE 5 MG PO TABS: 5.0000 mg | ORAL_TABLET | Freq: Every day | ORAL | 2 refills | Status: DC

## 2017-11-09 NOTE — Telephone Encounter (Signed)
I sent patient's glipizide and gabapentin to her pharmacy at [facility].  Thanks!

## 2017-11-09 NOTE — Telephone Encounter (Signed)
Pt would like new prescriptions for Gabapentin and glipizide re-sent to the pharmacy. She was not able to pick them up when they were sent in August. She can pay for them now. jw

## 2017-11-19 ENCOUNTER — Other Ambulatory Visit: Payer: Self-pay | Admitting: Family Medicine

## 2017-11-19 ENCOUNTER — Encounter: Payer: Self-pay | Admitting: Family Medicine

## 2017-11-19 MED ORDER — TRAMADOL HCL 50 MG PO TABS: 50.0000 mg | ORAL_TABLET | Freq: Three times a day (TID) | ORAL | 0 refills | Status: DC | PRN

## 2017-11-19 NOTE — Progress Notes (Signed)
Prescribing a short course of tramadol with plan for patient to be seen within the month to have leg pain assessed.

## 2017-11-20 ENCOUNTER — Other Ambulatory Visit: Payer: Self-pay | Admitting: Family Medicine

## 2017-11-22 ENCOUNTER — Other Ambulatory Visit: Payer: Self-pay | Admitting: Family Medicine

## 2017-11-22 ENCOUNTER — Encounter: Payer: Self-pay | Admitting: Family Medicine

## 2017-11-22 ENCOUNTER — Telehealth: Payer: Self-pay

## 2017-11-22 MED ORDER — TRAMADOL HCL 50 MG PO TABS: 50.0000 mg | ORAL_TABLET | Freq: Three times a day (TID) | ORAL | 0 refills | Status: DC | PRN

## 2017-11-22 NOTE — Telephone Encounter (Signed)
Patient called to ask to please re-send Tramadol to [facility] on [address]. Pharmacy cannot transfer.  Call back is [phone]  Ples Specter, RN [facility] RN)

## 2017-11-22 NOTE — Telephone Encounter (Signed)
Tramadol sent to [facility] on [address].  Thanks!

## 2017-11-24 ENCOUNTER — Encounter: Payer: Self-pay | Admitting: Family Medicine

## 2017-11-29 ENCOUNTER — Ambulatory Visit: Payer: Medicaid Other | Admitting: Family Medicine

## 2017-12-20 ENCOUNTER — Other Ambulatory Visit (HOSPITAL_COMMUNITY)
Admission: RE | Admit: 2017-12-20 | Discharge: 2017-12-20 | Disposition: A | Discharge status: Home / Self Care | Payer: Medicaid Other | Source: Ambulatory Visit | Attending: Family Medicine | Admitting: Family Medicine

## 2017-12-20 ENCOUNTER — Encounter: Payer: Self-pay | Admitting: Family Medicine

## 2017-12-20 ENCOUNTER — Other Ambulatory Visit: Payer: Self-pay

## 2017-12-20 ENCOUNTER — Ambulatory Visit (INDEPENDENT_AMBULATORY_CARE_PROVIDER_SITE_OTHER): Payer: Medicaid Other | Admitting: Family Medicine

## 2017-12-20 ENCOUNTER — Ambulatory Visit: Payer: Medicaid Other | Admitting: Psychology

## 2017-12-20 VITALS — BP 152/100 | HR 89 | Temp 98.6°F | Ht 67.0 in | Wt 370.0 lb

## 2017-12-20 DIAGNOSIS — E1169 Type 2 diabetes mellitus with other specified complication: Secondary | ICD-10-CM | POA: Diagnosis not present

## 2017-12-20 DIAGNOSIS — E119 Type 2 diabetes mellitus without complications: Secondary | ICD-10-CM

## 2017-12-20 DIAGNOSIS — M62838 Other muscle spasm: Secondary | ICD-10-CM

## 2017-12-20 DIAGNOSIS — R52 Pain, unspecified: Secondary | ICD-10-CM

## 2017-12-20 DIAGNOSIS — Z1331 Encounter for screening for depression: Secondary | ICD-10-CM

## 2017-12-20 DIAGNOSIS — Z124 Encounter for screening for malignant neoplasm of cervix: Secondary | ICD-10-CM

## 2017-12-20 DIAGNOSIS — E669 Obesity, unspecified: Secondary | ICD-10-CM

## 2017-12-20 DIAGNOSIS — M5410 Radiculopathy, site unspecified: Secondary | ICD-10-CM | POA: Diagnosis not present

## 2017-12-20 DIAGNOSIS — Z113 Encounter for screening for infections with a predominantly sexual mode of transmission: Secondary | ICD-10-CM | POA: Diagnosis not present

## 2017-12-20 HISTORY — DX: Encounter for screening for malignant neoplasm of cervix: Z12.4

## 2017-12-20 LAB — POCT GLYCOSYLATED HEMOGLOBIN (HGB A1C): HbA1c, POC (controlled diabetic range): 8.2 % — AB (ref 0.0–7.0)

## 2017-12-20 MED ORDER — GLIPIZIDE 5 MG PO TABS: 5.0000 mg | ORAL_TABLET | Freq: Every day | ORAL | 2 refills | Status: DC

## 2017-12-20 MED ORDER — METFORMIN HCL 1000 MG PO TABS: 1000.0000 mg | ORAL_TABLET | Freq: Two times a day (BID) | ORAL | 2 refills | Status: DC

## 2017-12-20 MED ORDER — METHOCARBAMOL 500 MG PO TABS: 500.0000 mg | ORAL_TABLET | Freq: Three times a day (TID) | ORAL | 0 refills | Status: DC

## 2017-12-20 MED ORDER — NAPROXEN 500 MG PO TABS: 500.0000 mg | ORAL_TABLET | Freq: Two times a day (BID) | ORAL | 1 refills | Status: DC

## 2017-12-20 MED ORDER — DULOXETINE HCL 60 MG PO CPEP: 60.0000 mg | ORAL_CAPSULE | Freq: Every day | ORAL | 1 refills | Status: DC

## 2017-12-20 MED ORDER — GABAPENTIN 300 MG PO CAPS: 300.0000 mg | ORAL_CAPSULE | Freq: Three times a day (TID) | ORAL | 2 refills | Status: DC | PRN

## 2017-12-20 NOTE — Assessment & Plan Note (Signed)
ASSESSMENT: Patient expressed interest in losing weight. Patient has been thinking about losing weight for the past year, as she noticed that her physical health has been impacted by her weight gain and maintenance. Patient reported that she has not engaged in weight loss strategies previously. [facility] used motivational interviewing to understand patient's knowledge of weight loss strategies. Patient reported that diet, exercise, and drinking contribute to weight gain/loss. [facility] validated these insights and provided additional information that quality of sleep can also contribute to weight and overall health. Patient reported that she is most interested in behavioral changes related to diet and sleep. Patient reported that she does not eat consistent meals, specifically she will skip breakfast and lunch and eat a large meal for dinner. She reports drinking large amounts of sweet drinks throughout the day, such as orange, cranberry, and apple juice. She reports that she rarely drinks soda. [facility] and patient brainstormed one behavioral change in patient's diet for the upcoming week, specifically to dilute her juice drinks with water in order to limit her sugar consumption. When asked how important and how confident the patient is to make this behavioral change, patient reported 10/10 for both importance and confidence. As patient had to leave, sleep hygiene was not discussed at the current session. Dr. Constance Goltzlson has provided and referred patient to Dr. Gerilyn PilgrimSykes for weight management and dietary strategies.    PLAN: 1. Follow up with behavioral health clinician on : Not scheduled at this time.  2. Behavioral recommendations: Dilute juice beverages with water.  3. Referral(s): Dietician  4. "From scale of 1-10, how likely are you to follow plan?": 10  Renaldo HarrisonYuji Y Kim
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
## 2017-12-20 NOTE — Assessment & Plan Note (Signed)
Scored a 10/27 on her PHQ-9.  Patient states her depression is related to the pain in her legs and having to work at a job that causes more pain.  She is wanting to sleep more in the day and her appetite has decreased.  She is more irritable. Started patient on 60mg  of cymbalta daily as that has effects on both depression and chronic pain.  Will followup in one month.

## 2017-12-20 NOTE — Assessment & Plan Note (Signed)
BMI 58. Discussed with patient the importance of weight loss and how it affects her pain and her diabetes and how diabetes can also contribute to her numbness in her legs.  Patient agreed to speak with someone regarding help with weight loss.  Performed warm handoff with behavioral health.  Gave patient information of our dietician Wyona AlmasJeannie Sykes.
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
## 2017-12-20 NOTE — Assessment & Plan Note (Signed)
Patient was not taking her metformin or glipizide due to cost.  A1c was 8.2, same as three months ago.  Patient is not currently dieting.  Drinks multiple glasses of juice a day.  Patient's metformin and glipizide were reordered.

## 2017-12-20 NOTE — Assessment & Plan Note (Signed)
GC swab performed today during speculum exam.  No visible lesions seen.  No adnexal or cervical motion tenderness.

## 2017-12-20 NOTE — Assessment & Plan Note (Signed)
Pap smear performed today.  No cervical lesions visuallized.  No cervical motion tenderness. Will notify patient of results when they come back.

## 2017-12-20 NOTE — BH Specialist Note (Signed)
Integrated Behavioral Health Initial Visit  MRN: [id number] Name: Lisa Cooley  Number of Integrated Behavioral Health Clinician visits:: 1/6 Session Start time: 12:05 PM  Session End time: 12:20 PM Total time: 15 Minutes   Type of Service: Integrated Behavioral Health- Individual/Family Interpretor:No. Interpretor Name and Language: N/A    Warm Hand Off Completed.       SUBJECTIVE: Lisa Cooley is a 32 y.o. female  Patient was referred by Dr. Constance Goltzlson for weight management. Patient reports the following symptoms/concerns: Interest in losing weight  Duration of problem: 1 year; Severity of problem: mild  OBJECTIVE: Mood: Neutral and Affect: Appropriate Risk of harm to self or others: No evidence of SI.   LIFE CONTEXT: Family and Social: Not assessed.  School/Work: Works as a Music therapistnurse  Self-Care: Not assessed.  Life Changes: Not assessed.   GOALS ADDRESSED: Patient will: 1. Increase knowledge and/or ability of: Weight loss behavioral strategies   INTERVENTIONS: Interventions utilized: Motivational Interviewing and solution-focused strategies Standardized Assessments completed: Not utilized  ASSESSMENT: Patient expressed interest in losing weight. Patient has been thinking about losing weight for the past year, as she noticed that her physical health has been impacted by her weight gain and maintenance. Patient reported that she has not engaged in weight loss strategies previously. [facility] used motivational interviewing to understand patient's knowledge of weight loss strategies. Patient reported that diet, exercise, and drinking contribute to weight gain/loss. [facility] intern validated these insights and provided additional information that quality of sleep can also contribute to weight and overall health. Patient reported that she is most interested in behavioral changes related to diet and sleep. Patient reported that she does not eat consistent meals, specifically  she will skip breakfast and lunch and eat a large meal for dinner. She reports drinking large amounts of sweet drinks throughout the day, such as orange, cranberry, and apple juice. She reports that she rarely drinks soda. [facility] and patient brainstormed one behavioral change in patient's diet for the upcoming week, specifically to dilute her juice drinks with water in order to limit her sugar consumption. When asked how important and how confident the patient is to make this behavioral change, patient reported 10/10 for both importance and confidence. As patient had to leave, sleep hygiene was not discussed at the current session. Dr. Constance Goltzlson has provided and referred patient to Dr. Gerilyn PilgrimSykes for weight management and dietary strategies.    PLAN: 1. Follow up with behavioral health clinician on : Not scheduled at this time.  2. Behavioral recommendations: Dilute juice beverages with water.  3. Referral(s): Dietician  4. "From scale of 1-10, how likely are you to follow plan?": 10  Renaldo HarrisonYuji Y Kim

## 2017-12-21 NOTE — Addendum Note (Signed)
Addended by: Spero GeraldsKANE, MICHELLE E on: 12/21/2017 12:03 PM   Modules accepted: Level of Service

## 2017-12-22 ENCOUNTER — Encounter: Payer: Self-pay | Admitting: Family Medicine

## 2017-12-22 LAB — CYTOLOGY - PAP
Diagnosis: NEGATIVE
HPV: NOT DETECTED
Trichomonas: NEGATIVE

## 2017-12-23 ENCOUNTER — Encounter: Payer: Self-pay | Admitting: Family Medicine

## 2017-12-28 ENCOUNTER — Encounter: Payer: Self-pay | Admitting: Family Medicine

## 2018-03-05 ENCOUNTER — Encounter (HOSPITAL_COMMUNITY): Payer: Self-pay

## 2018-03-05 ENCOUNTER — Ambulatory Visit (HOSPITAL_COMMUNITY)
Admission: EM | Admit: 2018-03-05 | Discharge: 2018-03-05 | Disposition: A | Discharge status: Home / Self Care | Payer: Medicaid Other | Attending: Family Medicine | Admitting: Family Medicine

## 2018-03-05 ENCOUNTER — Other Ambulatory Visit: Payer: Self-pay

## 2018-03-05 DIAGNOSIS — R030 Elevated blood-pressure reading, without diagnosis of hypertension: Secondary | ICD-10-CM | POA: Insufficient documentation

## 2018-03-05 DIAGNOSIS — Z711 Person with feared health complaint in whom no diagnosis is made: Secondary | ICD-10-CM | POA: Insufficient documentation

## 2018-03-05 DIAGNOSIS — Z113 Encounter for screening for infections with a predominantly sexual mode of transmission: Secondary | ICD-10-CM

## 2018-03-05 DIAGNOSIS — Z7251 High risk heterosexual behavior: Secondary | ICD-10-CM

## 2018-03-05 DIAGNOSIS — N898 Other specified noninflammatory disorders of vagina: Secondary | ICD-10-CM | POA: Insufficient documentation

## 2018-03-05 DIAGNOSIS — Z3202 Encounter for pregnancy test, result negative: Secondary | ICD-10-CM

## 2018-03-05 LAB — POCT URINALYSIS DIP (DEVICE)
Bilirubin Urine: NEGATIVE
Glucose, UA: NEGATIVE mg/dL
HGB URINE DIPSTICK: NEGATIVE
KETONES UR: NEGATIVE mg/dL
LEUKOCYTES UA: NEGATIVE
Nitrite: NEGATIVE
Protein, ur: NEGATIVE mg/dL
SPECIFIC GRAVITY, URINE: 1.025 (ref 1.005–1.030)
Urobilinogen, UA: 0.2 mg/dL (ref 0.0–1.0)
pH: 6 (ref 5.0–8.0)

## 2018-03-05 LAB — POCT PREGNANCY, URINE: Preg Test, Ur: NEGATIVE

## 2018-03-05 MED ORDER — AZITHROMYCIN 250 MG PO TABS
ORAL_TABLET | ORAL | Status: AC
  Filled 2018-03-05: qty 4

## 2018-03-05 MED ORDER — CEFTRIAXONE SODIUM 250 MG IJ SOLR
250.0000 mg | Freq: Once | INTRAMUSCULAR | Status: AC
  Administered 2018-03-05: 250 mg via INTRAMUSCULAR

## 2018-03-05 MED ORDER — CEFTRIAXONE SODIUM 250 MG IJ SOLR
INTRAMUSCULAR | Status: AC
  Filled 2018-03-05: qty 250

## 2018-03-05 MED ORDER — AZITHROMYCIN 250 MG PO TABS
1000.0000 mg | ORAL_TABLET | Freq: Once | ORAL | Status: AC
  Administered 2018-03-05: 1000 mg via ORAL

## 2018-03-05 NOTE — ED Triage Notes (Signed)
Pt states today that she has vaginal irritation and some pain for 2 days. States she has not had any discharge or itching. States the pain is on the inside like she has scratched it. Does burn a little to urinate.

## 2018-03-05 NOTE — ED Provider Notes (Signed)
[facility]   [id number] 03/05/18 Arrival Time: 1016   LO:VFIEPPI irritation  SUBJECTIVE:  Lisa Cooley is a 33 y.o. female who presents with complaints of vaginal irritation and increased urinary frequency x 3 days.  Reports recent sexual activity around the time of symptoms.  Patient has been sexually active with 2 female partners within the last 6 months. She has NOT tried OTC medications.  She reports similar symptoms in the past and was diagnosed with UTI.  She denies fever, chills, nausea, vomiting, abdominal or pelvic pain, vaginal itching, vaginal odor, vaginal bleeding, dyspareunia, vaginal rashes or lesions.   No LMP recorded. (Menstrual status: Irregular Periods). Current birth control method: None  ROS: As per HPI.  Past Medical History:  Diagnosis Date  . Diabetes mellitus    Dx in [place] but not on meds  . Headache(784.0)   . Hypertension    Past Surgical History:  Procedure Laterality Date  . NO PAST SURGERIES     No Known Allergies No current facility-administered medications on file prior to encounter.    Current Outpatient Medications on File Prior to Encounter  Medication Sig Dispense Refill  . glipiZIDE (GLUCOTROL) 5 MG tablet Take 1 tablet (5 mg total) by mouth daily before breakfast. 30 tablet 2  . metFORMIN (GLUCOPHAGE) 1000 MG tablet Take 1 tablet (1,000 mg total) by mouth 2 (two) times daily with a meal. 60 tablet 2  . naproxen (NAPROSYN) 500 MG tablet Take 1 tablet (500 mg total) by mouth 2 (two) times daily with a meal. Prn pain 60 tablet 1  . albuterol (PROVENTIL HFA;VENTOLIN HFA) 108 (90 Base) MCG/ACT inhaler Inhale 2 puffs into the lungs every 6 (six) hours as needed for wheezing or shortness of breath. 1 Inhaler 2  . DULoxetine (CYMBALTA) 60 MG capsule Take 1 capsule (60 mg total) by mouth daily. 30 capsule 1  . gabapentin (NEURONTIN) 300 MG capsule Take 1 capsule (300 mg total) by mouth 3 (three) times daily as needed. 60 capsule  2    Social History   Socioeconomic History  . Marital status: Single    Spouse name: Not on file  . Number of children: Not on file  . Years of education: Not on file  . Highest education level: Not on file  Occupational History  . Not on file  Social Needs  . Financial resource strain: Not on file  . Food insecurity:    Worry: Not on file    Inability: Not on file  . Transportation needs:    Medical: Not on file    Non-medical: Not on file  Tobacco Use  . Smoking status: Former Smoker    Packs/day: 0.25    Types: Cigarettes    Last attempt to quit: 04/20/2014    Years since quitting: 3.8  . Smokeless tobacco: Never Used  Substance and Sexual Activity  . Alcohol use: No    Alcohol/week: 0.0 standard drinks  . Drug use: Yes    Types: Marijuana    Comment: last use 2 days ago  . Sexual activity: Yes    Birth control/protection: None  Lifestyle  . Physical activity:    Days per week: Not on file    Minutes per session: Not on file  . Stress: Not on file  Relationships  . Social connections:    Talks on phone: Not on file    Gets together: Not on file    Attends religious service: Not on file  Active member of club or organization: Not on file    Attends meetings of clubs or organizations: Not on file    Relationship status: Not on file  . Intimate partner violence:    Fear of current or ex partner: Not on file    Emotionally abused: Not on file    Physically abused: Not on file    Forced sexual activity: Not on file  Other Topics Concern  . Not on file  Social History Narrative  . Not on file   Family History  Problem Relation Age of Onset  . Cancer Father   . Heart disease Sister   . Other Neg Hx     OBJECTIVE:  Vitals:   03/05/18 1055  BP: (!) 154/107  Pulse: 94  Resp: 16  Temp: 98.5 F (36.9 C)  TempSrc: Oral  SpO2: 100%     General appearance: Alert, NAD, appears stated age; obese Head: NCAT Throat: lips, mucosa, and tongue normal;  teeth and gums normal Lungs: CTA bilaterally without adventitious breath sounds Heart: regular rate and rhythm.   Back: no CVA tenderness Abdomen: soft, protuberant, non-tender; bowel sounds normal; no guarding GU: deferred; denies pelvic pain or skin changes Skin: warm and dry Psychological:  Alert and cooperative. Normal mood and affect.  ASSESSMENT & PLAN:  1. Vaginal irritation   2. Concern about STD in female without diagnosis   3. Elevated blood pressure reading     Meds ordered this encounter  Medications  . cefTRIAXone (ROCEPHIN) injection 250 mg    Order Specific Question:   Antibiotic Indication:    Answer:   STD  . azithromycin (ZITHROMAX) tablet 1,000 mg    Pending: Labs Reviewed  RPR  HIV ANTIBODY (ROUTINE TESTING W REFLEX)  POC URINE PREG, ED  POCT URINALYSIS DIP (DEVICE)  POCT PREGNANCY, URINE  CERVICOVAGINAL ANCILLARY ONLY    Urine pregnancy negative Urine did not show signs of infection Vaginal self-swab obtained Given rocephin 250mg  injection and azithromycin 1g in office HIV/ syphilis testing today If tests are positive, please abstain from sexual activity for at least 7 days and notify partners Follow up with PCP if symptoms persists Return here or go to ER if you have any new or worsening symptoms fever, chills, nausea, vomiting, abdominal or pelvic pain, painful intercourse, vaginal discharge, vaginal bleeding, persistent symptoms despite treatment, etc...  Blood pressure elevated in office.  Please recheck in 24 hours.  If it continues to be greater than 140/90 please follow up with PCP for further evaluation and management.    Reviewed expectations re: course of current medical issues. Questions answered. Outlined signs and symptoms indicating need for more acute intervention. Patient verbalized understanding. After Visit Summary given.       Rennis Harding, PA-C 03/05/18 1300

## 2018-03-05 NOTE — Discharge Instructions (Signed)
Urine pregnancy negative Urine did not show signs of infection Vaginal self-swab obtained Given rocephin 250mg  injection and azithromycin 1g in office HIV/ syphilis testing today If tests are positive, please abstain from sexual activity for at least 7 days and notify partners Follow up with PCP if symptoms persists Return here or go to ER if you have any new or worsening symptoms fever, chills, nausea, vomiting, abdominal or pelvic pain, painful intercourse, vaginal discharge, vaginal bleeding, persistent symptoms despite treatment, etc...  Blood pressure elevated in office.  Please recheck in 24 hours.  If it continues to be greater than 140/90 please follow up with PCP for further evaluation and management.

## 2018-03-06 LAB — RPR: RPR Ser Ql: NONREACTIVE

## 2018-03-06 LAB — HIV ANTIBODY (ROUTINE TESTING W REFLEX): HIV Screen 4th Generation wRfx: NONREACTIVE

## 2018-03-07 LAB — CERVICOVAGINAL ANCILLARY ONLY
BACTERIAL VAGINITIS: POSITIVE — AB
CANDIDA VAGINITIS: NEGATIVE
Chlamydia: NEGATIVE
Neisseria Gonorrhea: NEGATIVE
Trichomonas: NEGATIVE

## 2018-03-09 ENCOUNTER — Encounter: Payer: Self-pay | Admitting: Family Medicine

## 2018-03-09 ENCOUNTER — Other Ambulatory Visit: Payer: Self-pay | Admitting: Family Medicine

## 2018-03-09 ENCOUNTER — Telehealth: Payer: Self-pay | Admitting: Family Medicine

## 2018-03-09 MED ORDER — METRONIDAZOLE 500 MG PO TABS: 500.0000 mg | ORAL_TABLET | Freq: Two times a day (BID) | ORAL | 0 refills | Status: AC

## 2018-03-09 NOTE — Telephone Encounter (Signed)
Patient informed that she was found to have bacterial vaginosis at urgent care and told that I would call in metronidazole 500 mg twice daily for 7 days and to her pharmacy.  Patient expressed understanding.

## 2018-08-11 ENCOUNTER — Ambulatory Visit (HOSPITAL_COMMUNITY)
Admission: EM | Admit: 2018-08-11 | Discharge: 2018-08-11 | Disposition: A | Discharge status: Home / Self Care | Payer: Medicaid Other | Attending: Family Medicine | Admitting: Family Medicine

## 2018-08-11 ENCOUNTER — Encounter (HOSPITAL_COMMUNITY): Payer: Self-pay

## 2018-08-11 ENCOUNTER — Other Ambulatory Visit: Payer: Self-pay

## 2018-08-11 DIAGNOSIS — Z9114 Patient's other noncompliance with medication regimen: Secondary | ICD-10-CM

## 2018-08-11 DIAGNOSIS — R300 Dysuria: Secondary | ICD-10-CM

## 2018-08-11 DIAGNOSIS — I10 Essential (primary) hypertension: Secondary | ICD-10-CM

## 2018-08-11 DIAGNOSIS — E119 Type 2 diabetes mellitus without complications: Secondary | ICD-10-CM

## 2018-08-11 DIAGNOSIS — N898 Other specified noninflammatory disorders of vagina: Secondary | ICD-10-CM

## 2018-08-11 LAB — GLUCOSE, CAPILLARY: Glucose-Capillary: 253 mg/dL — ABNORMAL HIGH (ref 70–99)

## 2018-08-11 MED ORDER — AMLODIPINE BESYLATE 5 MG PO TABS: 5.0000 mg | ORAL_TABLET | Freq: Every day | ORAL | 1 refills | Status: DC

## 2018-08-11 NOTE — ED Triage Notes (Signed)
Pt presents with vaginal irritation with no complaints of pain or discharge X 7 days.

## 2018-08-11 NOTE — ED Notes (Signed)
Patient is still unable to void at this time.  

## 2018-08-11 NOTE — ED Notes (Signed)
Patient never provided a urine specimen

## 2018-08-11 NOTE — Discharge Instructions (Signed)
I have started you on a low-dose of blood pressure medication. Take the medication daily Make sure you are checking your blood pressures at home and recording them Drink plenty of fluids and watch her salt intake. Your blood sugar was 253 today.  Make sure you are watching your sugar intake and taking your diabetes medication as prescribed I would like for you to schedule an appointment with your primary care for follow-up and recheck of your blood pressure For any continued or worsening problems to include severe chest pain, shortness of breath, dizziness or headache you need to go to the ER.  We are sending a self swab for STD testing. We will call you with any positive results If your urinary symptoms continue you need to return for a sample since you were unable to provide one today.

## 2018-08-11 NOTE — ED Provider Notes (Signed)
[facility]    CSN: [id number] Arrival date & time: 08/11/18  1205     History   Chief Complaint Chief Complaint  Patient presents with  . Vaginal Irritation    HPI Lisa Cooley is a 33 y.o. female.   Patient is a 33 year old female with past medical history of diabetes, headache, hypertension, morbid obesity.  She presents today with vaginal irritation, dysuria.  Denies any associated discharge or abdominal pain. She has also had some intermittent dizziness and headaches at times. None today. BP has been elevated consistently over the past few months. She is not taking any meds for BP. She has also had some frequent urination. No polydipsia, polyphagia.   ROS per HPI      Past Medical History:  Diagnosis Date  . Diabetes mellitus    Dx in [place] but not on meds  . Headache(784.0)   . Hypertension     Patient Active Problem List   Diagnosis Date Noted  . Screening for malignant neoplasm of cervix 12/20/2017  . Positive depression screening 12/20/2017  . Unprotected sex 09/17/2017  . 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 Screen for STD (sexually transmitted disease) 09/17/2017  . Diabetes mellitus type 2 in obese (HCC) 12/31/2016  . Epigastric pain 08/13/2011  . DM2 (diabetes mellitus, type 2) (HCC) 08/12/2011  . Morbid obesity (HCC) 08/12/2011  . Infertility, female 08/12/2011  . Irregular periods 08/12/2011    Past Surgical History:  Procedure Laterality Date  . NO PAST SURGERIES      OB History    Gravida  0   Para  0   Term  0   Preterm      AB  0   Living  0     SAB  0   TAB      Ectopic      Multiple      Live Births               Home Medications    Prior to Admission medications   Medication Sig Start Date End Date Taking? Authorizing Provider  albuterol (PROVENTIL HFA;VENTOLIN HFA) 108 (90 Base) MCG/ACT inhaler Inhale 2 puffs into the lungs every 6 (six) hours as needed for wheezing or  shortness of breath. 12/29/16   Lizbeth BarkHairston, Mandesia R, FNP  amLODipine (NORVASC) 5 MG tablet Take 1 tablet (5 mg total) by mouth daily. 08/11/18   Dahlia ByesBast, Kijuan Gallicchio A, NP  DULoxetine (CYMBALTA) 60 MG capsule Take 1 capsule (60 mg total) by mouth daily. 12/20/17   Sandre Kittylson, Daniel K, MD  gabapentin (NEURONTIN) 300 MG capsule Take 1 capsule (300 mg total) by mouth 3 (three) times daily as needed. 12/20/17   Sandre Kittylson, Daniel K, MD  glipiZIDE (GLUCOTROL) 5 MG tablet Take 1 tablet (5 mg total) by mouth daily before breakfast. 12/20/17   Sandre Kittylson, Daniel K, MD  metFORMIN (GLUCOPHAGE) 1000 MG tablet Take 1 tablet (1,000 mg total) by mouth 2 (two) times daily with a meal. 12/20/17   Sandre Kittylson, Daniel K, MD  naproxen (NAPROSYN) 500 MG tablet Take 1 tablet (500 mg total) by mouth 2 (two) times daily with a meal. Prn pain 12/20/17   Sandre Kittylson, Daniel K, MD    Family History Family History  Problem Relation Age of Onset  . Cancer Father   . Heart disease Sister   . Other Neg Hx     Social History Social History   Tobacco Use  . Smoking status: Former Smoker  Packs/day: 0.25    Types: Cigarettes    Quit date: 04/20/2014    Years since quitting: 4.3  . Smokeless tobacco: Never Used  Substance Use Topics  . Alcohol use: No    Alcohol/week: 0.0 standard drinks  . Drug use: Yes    Types: Marijuana    Comment: last use 2 days ago     Allergies   Patient has no known allergies.   Review of Systems Review of Systems   Physical Exam Triage Vital Signs ED Triage Vitals  Enc Vitals Group     BP 08/11/18 1238 (!) 166/108     Pulse Rate 08/11/18 1238 (!) 109     Resp 08/11/18 1238 18     Temp 08/11/18 1238 98.8 F (37.1 C)     Temp Source 08/11/18 1238 Oral     SpO2 08/11/18 1238 100 %     Weight --      Height --      Head Circumference --      Peak Flow --      Pain Score 08/11/18 1239 0     Pain Loc --      Pain Edu? --      Excl. in [place]? --    No data found.  Updated Vital Signs BP (!) 166/108  (BP Location: Left Arm)   Pulse (!) 109   Temp 98.8 F (37.1 C) (Oral)   Resp 18   SpO2 100%   Visual Acuity Right Eye Distance:   Left Eye Distance:   Bilateral Distance:    Right Eye Near:   Left Eye Near:    Bilateral Near:     Physical Exam Vitals signs and nursing note reviewed.  Constitutional:      General: She is not in acute distress.    Appearance: Normal appearance. She is obese. She is not ill-appearing, toxic-appearing or diaphoretic.  HENT:     Head: Normocephalic and atraumatic.     Nose: Nose normal.     Mouth/Throat:     Pharynx: Oropharynx is clear.  Eyes:     Conjunctiva/sclera: Conjunctivae normal.  Neck:     Musculoskeletal: Normal range of motion.  Cardiovascular:     Rate and Rhythm: Normal rate and regular rhythm.     Pulses: Normal pulses.     Heart sounds: Normal heart sounds.  Pulmonary:     Effort: Pulmonary effort is normal.  Abdominal:     Palpations: Abdomen is soft.     Tenderness: There is no abdominal tenderness.  Musculoskeletal: Normal range of motion.  Skin:    General: Skin is warm and dry.     Findings: No rash.  Neurological:     General: No focal deficit present.     Mental Status: She is alert.     Cranial Nerves: No cranial nerve deficit.     Sensory: No sensory deficit.     Motor: No weakness.     Coordination: Coordination normal.     Gait: Gait normal.  Psychiatric:        Mood and Affect: Mood normal.      UC Treatments / Results  Labs (all labs ordered are listed, but only abnormal results are displayed) Labs Reviewed  GLUCOSE, CAPILLARY - Abnormal; Notable for the following components:      Result Value   Glucose-Capillary 253 (*)    All other components within normal limits  CERVICOVAGINAL ANCILLARY ONLY - Abnormal; Notable for the following  components:   Candida vaginitis **POSITIVE for Candida glabrata** (*)    All other components within normal limits  CBG MONITORING, ED    EKG   Radiology  No results found.  Procedures Procedures (including critical care time)  Medications Ordered in UC Medications - No data to display  Initial Impression / Assessment and Plan / UC Course  I have reviewed the triage vital signs and the nursing notes.  Pertinent labs & imaging results that were available during my care of the patient were reviewed by me and considered in my medical decision making (see chart for details).    Vaginal irritation dysuria Urine negative for infection or pregnancy Sending swab for testing.   Essential hypertension Starting pt on Bp meds, amlodipine 5 mg  I believe Lisa Cooley elevated BP is the cause of Lisa Cooley intermittent symptoms.   DM2-patient's blood sugar 253 today Reports that she has been taking Lisa Cooley diabetes medication as prescribed Whenever diet and decreasing sugar and increasing water intake.  Patient plans to follow-up with primary care for further evaluation of blood pressure and other medical conditions. Final Clinical Impressions(s) / UC Diagnoses   Final diagnoses:  Vaginal irritation  Essential hypertension  Type 2 diabetes mellitus without complication, without long-term current use of insulin (HCC)     Discharge Instructions     I have started you on a low-dose of blood pressure medication. Take the medication daily Make sure you are checking your blood pressures at home and recording them Drink plenty of fluids and watch Lisa Cooley salt intake. Your blood sugar was 253 today.  Make sure you are watching your sugar intake and taking your diabetes medication as prescribed I would like for you to schedule an appointment with your primary care for follow-up and recheck of your blood pressure For any continued or worsening problems to include severe chest pain, shortness of breath, dizziness or headache you need to go to the ER.  We are sending a self swab for STD testing. We will call you with any positive results If your urinary symptoms  continue you need to return for a sample since you were unable to provide one today.      ED Prescriptions    Medication Sig Dispense Auth. Provider   amLODipine (NORVASC) 5 MG tablet Take 1 tablet (5 mg total) by mouth daily. 30 tablet Janace ArisBast, Rejina Odle A, NP     Controlled Substance Prescriptions [place] Controlled Substance Registry consulted? no   Janace ArisBast, Jahmani Staup A, NP 08/13/18 [phone]

## 2018-08-12 ENCOUNTER — Telehealth (HOSPITAL_COMMUNITY): Payer: Self-pay | Admitting: Emergency Medicine

## 2018-08-12 ENCOUNTER — Encounter: Payer: Self-pay | Admitting: Family Medicine

## 2018-08-12 LAB — CERVICOVAGINAL ANCILLARY ONLY
Bacterial vaginitis: NEGATIVE
Candida vaginitis: POSITIVE — AB
Chlamydia: NEGATIVE
Neisseria Gonorrhea: NEGATIVE
Trichomonas: NEGATIVE

## 2018-08-12 MED ORDER — FLUCONAZOLE 150 MG PO TABS: 150.0000 mg | ORAL_TABLET | Freq: Once | ORAL | 0 refills | Status: AC

## 2018-08-12 NOTE — Telephone Encounter (Signed)
Test for candida (yeast) was positive.  Prescription for fluconazole 150mg po now, repeat dose in 3d if needed, #2 no refills, sent to the pharmacy of record.  Recheck or followup with PCP for further evaluation if symptoms are not improving.    Patient contacted and made aware of all results, all questions answered.   

## 2018-08-13 ENCOUNTER — Emergency Department (HOSPITAL_COMMUNITY)
Admission: EM | Admit: 2018-08-13 | Discharge: 2018-08-13 | Disposition: A | Discharge status: Home / Self Care | Payer: Medicaid Other | Attending: Emergency Medicine | Admitting: Emergency Medicine

## 2018-08-13 ENCOUNTER — Other Ambulatory Visit: Payer: Self-pay

## 2018-08-13 ENCOUNTER — Encounter (HOSPITAL_COMMUNITY): Payer: Self-pay | Admitting: Emergency Medicine

## 2018-08-13 DIAGNOSIS — R519 Headache, unspecified: Secondary | ICD-10-CM

## 2018-08-13 DIAGNOSIS — R51 Headache: Secondary | ICD-10-CM | POA: Insufficient documentation

## 2018-08-13 DIAGNOSIS — I10 Essential (primary) hypertension: Secondary | ICD-10-CM | POA: Insufficient documentation

## 2018-08-13 DIAGNOSIS — R030 Elevated blood-pressure reading, without diagnosis of hypertension: Secondary | ICD-10-CM

## 2018-08-13 DIAGNOSIS — Z87891 Personal history of nicotine dependence: Secondary | ICD-10-CM | POA: Insufficient documentation

## 2018-08-13 DIAGNOSIS — Z79899 Other long term (current) drug therapy: Secondary | ICD-10-CM | POA: Insufficient documentation

## 2018-08-13 DIAGNOSIS — Z7984 Long term (current) use of oral hypoglycemic drugs: Secondary | ICD-10-CM | POA: Insufficient documentation

## 2018-08-13 DIAGNOSIS — E119 Type 2 diabetes mellitus without complications: Secondary | ICD-10-CM | POA: Insufficient documentation

## 2018-08-13 LAB — URINALYSIS, ROUTINE W REFLEX MICROSCOPIC
Bacteria, UA: NONE SEEN
Bilirubin Urine: NEGATIVE
Glucose, UA: NEGATIVE mg/dL
Ketones, ur: NEGATIVE mg/dL
Nitrite: NEGATIVE
Protein, ur: 100 mg/dL — AB
RBC / HPF: >50 RBC/hpf — ABNORMAL HIGH (ref 0–5)
Specific Gravity, Urine: 1.029 (ref 1.005–1.030)
WBC, UA: >50 WBC/hpf — ABNORMAL HIGH (ref 0–5)
pH: 5 (ref 5.0–8.0)

## 2018-08-13 LAB — CBC
HCT: 38.2 % (ref 36.0–46.0)
Hemoglobin: 11.9 g/dL — ABNORMAL LOW (ref 12.0–15.0)
MCH: 22.8 pg — ABNORMAL LOW (ref 26.0–34.0)
MCHC: 31.2 g/dL (ref 30.0–36.0)
MCV: 73 fL — ABNORMAL LOW (ref 80.0–100.0)
Platelets: 495 10*3/uL — ABNORMAL HIGH (ref 150–400)
RBC: 5.23 MIL/uL — ABNORMAL HIGH (ref 3.87–5.11)
RDW: 18.6 % — ABNORMAL HIGH (ref 11.5–15.5)
WBC: 8.5 10*3/uL (ref 4.0–10.5)
nRBC: 0 % (ref 0.0–0.2)

## 2018-08-13 LAB — CBG MONITORING, ED: Glucose-Capillary: 98 mg/dL (ref 70–99)

## 2018-08-13 LAB — BASIC METABOLIC PANEL
Anion gap: 9 (ref 5–15)
BUN: 9 mg/dL (ref 6–20)
CO2: 25 mmol/L (ref 22–32)
Calcium: 9.1 mg/dL (ref 8.9–10.3)
Chloride: 103 mmol/L (ref 98–111)
Creatinine, Ser: 0.79 mg/dL (ref 0.44–1.00)
GFR calc Af Amer: >60 mL/min (ref >60)
GFR calc non Af Amer: >60 mL/min (ref >60)
Glucose, Bld: 116 mg/dL — ABNORMAL HIGH (ref 70–99)
Potassium: 3.6 mmol/L (ref 3.5–5.1)
Sodium: 137 mmol/L (ref 135–145)

## 2018-08-13 LAB — I-STAT BETA HCG BLOOD, ED (MC, WL, AP ONLY): I-stat hCG, quantitative: <5 m[IU]/mL (ref <5)

## 2018-08-13 MED ORDER — DIPHENHYDRAMINE HCL 50 MG/ML IJ SOLN
12.5000 mg | Freq: Once | INTRAMUSCULAR | Status: AC
  Administered 2018-08-13: 12.5 mg via INTRAVENOUS
  Filled 2018-08-13: qty 1

## 2018-08-13 MED ORDER — METOCLOPRAMIDE HCL 5 MG/ML IJ SOLN
10.0000 mg | Freq: Once | INTRAMUSCULAR | Status: AC
  Administered 2018-08-13: 10 mg via INTRAVENOUS
  Filled 2018-08-13: qty 2

## 2018-08-13 MED ORDER — SODIUM CHLORIDE 0.9% FLUSH
3.0000 mL | Freq: Once | INTRAVENOUS | Status: AC
  Administered 2018-08-13: 3 mL via INTRAVENOUS

## 2018-08-13 MED ORDER — AMLODIPINE BESYLATE 2.5 MG PO TABS: 2.5000 mg | ORAL_TABLET | Freq: Every day | ORAL | 0 refills | Status: DC

## 2018-08-13 NOTE — ED Triage Notes (Signed)
Pt here for eval of dizziness and hypertension since the 9th. Pt states she has diabetes and does not check her blood sugars on a regular basis. Pt reports she was driving and felt dizzy and like the things around her were spinning. Also reports increase in urination.

## 2018-08-13 NOTE — Discharge Instructions (Addendum)
You have been diagnosed today with headache, elevated blood pressure reading.  At this time there does not appear to be the presence of an emergent medical condition, however there is always the potential for conditions to change. Please read and follow the below instructions.  Please return to the Emergency Department immediately for any new or worsening symptoms or if your symptoms return. Please be sure to follow up with your Primary Care Provider within one week regarding your visit today; please call their office to schedule an appointment even if you are feeling better for a follow-up visit. It is possible that your new blood pressure medication is causing your symptoms of headache, blurred vision and lightheadedness.  Your blood pressure medication amlodipine has been changed today to a lower dose which she may tolerate better.  Please stop taking your old amlodipine prescription.  Begin taking the new prescription amlodipine 2.5 mg once daily to help with treatment of your high blood pressure.  Call your primary care doctor tomorrow to schedule a follow-up appointment for blood pressure recheck and medication management.  Get help right away if you: Get a very bad headache. Start to feel mixed up (confused). Feel weak or numb. Feel faint. Have very bad pain in your: Chest. Belly (abdomen). Throw up more than once. Have trouble breathing. Get help right away if: Your headache gets very bad quickly. Your headache gets worse after a lot of physical activity. You keep throwing up. You have a stiff neck. You have trouble seeing. You have trouble speaking. You have pain in the eye or ear. Your muscles are weak or you lose muscle control. You lose your balance or have trouble walking. You feel like you will pass out (faint) or you pass out. You are mixed up (confused). You have a seizure. Any new/concerning or worsening symptoms  Please read the additional information packets  attached to your discharge summary.  Do not take your medicine if  develop an itchy rash, swelling in your mouth or lips, or difficulty breathing; call 911 and seek immediate emergency medical attention if this occurs.

## 2018-08-13 NOTE — ED Notes (Signed)
Pt discharge paperwork reviewed. Pt unable to sign due to signature pad not working. Pt verbalized understanding. Pt discharged.

## 2018-08-13 NOTE — ED Provider Notes (Signed)
[facility] EMERGENCY DEPARTMENT Provider Note   CSN: [id number] Arrival date & time: 08/13/18  1510    History   Chief Complaint Chief Complaint  Patient presents with  . Dizziness    HPI Lisa Cooley is a 33 y.o. female presenting today for headache, blurred vision and lightheadedness x2 days.  Patient reports that on 08/11/2018 she began taking a new blood pressure medication amlodipine 5 mg.  Patient reports that shortly after starting this medication she gradually developed a headache, lightheadedness/wooziness and blurred vision that resolves after a few hours, she describes the headache as a bilateral throbbing sensation that gradually resolves slightly worsened with bright lights and no clear alleviating factors.  She reports that she has not had this problem prior to starting the blood pressure medication.  Patient reports that her blood pressures daily at home prior to starting amlodipine were 854-627 systolic.  On my evaluation patient reports that her lightheadedness and blurred vision has improved but her headache persists and is mild intensity she is requesting medication regarding this.  Patient denies fever/chills, neck pain/stiffness, chest pain/shortness of breath, cough, abdominal pain, vomiting/diarrhea, dysuria/hematuria, vaginal discharge/bleeding, extremity swelling/pain or any additional concerns.  Patient had been seen at the urgent care vaginitis.  Work-up as below. --------------------------------------- Work-up at urgent care on 08/11/2018. Cervical vaginal swab positive for Candida vaginitis Negative bacterial vaginitis, negative chlamydia, negative gonorrhea, negative trichomonas. HIV negative RPR negative Glucose 203  Urgent care started her on amlodipine 5 mg.  Instructed to take diabetes medication. ------------------------------------- Patient reports that she was called today and prescribed fluconazole for her Candida  vaginitis which she is planning to pick up after she leaves. HPI  Past Medical History:  Diagnosis Date  . Diabetes mellitus    Dx in [place] but not on meds  . Headache(784.0)   . Hypertension     Patient Active Problem List   Diagnosis Date Noted  . Screening for malignant neoplasm of cervix 12/20/2017  . Positive depression screening 12/20/2017  . Unprotected sex 09/17/2017  . 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 Screen for STD (sexually transmitted disease) 09/17/2017  . Diabetes mellitus type 2 in obese ([place]) 12/31/2016  . Epigastric pain 08/13/2011  . DM2 (diabetes mellitus, type 2) ([place]) 08/12/2011  . Morbid obesity ([place]) 08/12/2011  . Infertility, female 08/12/2011  . Irregular periods 08/12/2011    Past Surgical History:  Procedure Laterality Date  . NO PAST SURGERIES       OB History    Gravida  0   Para  0   Term  0   Preterm      AB  0   Living  0     SAB  0   TAB      Ectopic      Multiple      Live Births               Home Medications    Prior to Admission medications   Medication Sig Start Date End Date Taking? Authorizing Provider  albuterol (PROVENTIL HFA;VENTOLIN HFA) 108 (90 Base) MCG/ACT inhaler Inhale 2 puffs into the lungs every 6 (six) hours as needed for wheezing or shortness of breath. 12/29/16   Alfonse Spruce, FNP  amLODipine (NORVASC) 2.5 MG tablet Take 1 tablet (2.5 mg total) by mouth daily. 08/13/18   Nuala Alpha A, PA-C  DULoxetine (CYMBALTA) 60 MG capsule Take 1 capsule (60 mg total) by mouth daily. 12/20/17  Olson, Daniel K, MD  gabapentin (NEURONTIN) Sandre Kitty300 MG capsule Take 1 capsule (300 mg total) by mouth 3 (three) times daily as needed. 12/20/17   Sandre Kittylson, Daniel K, MD  glipiZIDE (GLUCOTROL) 5 MG tablet Take 1 tablet (5 mg total) by mouth daily before breakfast. 12/20/17   Sandre Kittylson, Daniel K, MD  metFORMIN (GLUCOPHAGE) 1000 MG tablet Take 1 tablet (1,000 mg total) by mouth 2 (two) times daily with  a meal. 12/20/17   Sandre Kittylson, Daniel K, MD  naproxen (NAPROSYN) 500 MG tablet Take 1 tablet (500 mg total) by mouth 2 (two) times daily with a meal. Prn pain 12/20/17   Sandre Kittylson, Daniel K, MD    Family History Family History  Problem Relation Age of Onset  . Cancer Father   . Heart disease Sister   . Other Neg Hx     Social History Social History   Tobacco Use  . Smoking status: Former Smoker    Packs/day: 0.25    Types: Cigarettes    Quit date: 04/20/2014    Years since quitting: 4.3  . Smokeless tobacco: Never Used  Substance Use Topics  . Alcohol use: No    Alcohol/week: 0.0 standard drinks  . Drug use: Yes    Types: Marijuana    Comment: last use 2 days ago     Allergies   Patient has no known allergies.   Review of Systems Review of Systems  Constitutional: Negative for chills and fever.  Eyes: Positive for visual disturbance.  Respiratory: Negative.  Negative for cough and shortness of breath.   Cardiovascular: Negative.  Negative for chest pain.  Gastrointestinal: Negative.  Negative for abdominal pain.  Genitourinary: Negative.  Negative for dysuria and hematuria.  Musculoskeletal: Negative.  Negative for back pain, neck pain and neck stiffness.  Neurological: Positive for light-headedness and headaches. Negative for dizziness, syncope, speech difficulty, weakness and numbness.  All other systems reviewed and are negative.  Physical Exam Updated Vital Signs BP (!) 134/92 (BP Location: Right Arm)   Pulse 86   Temp 98.6 F (37 C) (Oral)   Resp 16   LMP 08/13/2018   SpO2 99%   Physical Exam Constitutional:      General: She is not in acute distress.    Appearance: Normal appearance. She is well-developed. She is obese. She is not ill-appearing or diaphoretic.  HENT:     Head: Normocephalic and atraumatic. No raccoon eyes, Battle's sign, abrasion or contusion.     Jaw: There is normal jaw occlusion. No trismus.     Right Ear: External ear normal.     Left  Ear: External ear normal.     Ears:     Comments: Hearing grossly intact bilaterally    Nose: Nose normal. No rhinorrhea.     Right Nostril: No epistaxis.     Left Nostril: No epistaxis.     Mouth/Throat:     Lips: Pink.     Mouth: Mucous membranes are moist.     Pharynx: Oropharynx is clear. Uvula midline.  Eyes:     General: Vision grossly intact. Gaze aligned appropriately.     Extraocular Movements: Extraocular movements intact.     Conjunctiva/sclera: Conjunctivae normal.     Pupils: Pupils are equal, round, and reactive to light.     Comments: Visual fields grossly intact bilaterally  Neck:     Musculoskeletal: Full passive range of motion without pain, normal range of motion and neck supple. No neck rigidity.  Trachea: Trachea and phonation normal. No tracheal tenderness or tracheal deviation.     Meningeal: Brudzinski's sign absent.  Cardiovascular:     Rate and Rhythm: Normal rate and regular rhythm.     Pulses:          Dorsalis pedis pulses are 2+ on the right side and 2+ on the left side.     Heart sounds: Normal heart sounds.  Pulmonary:     Effort: Pulmonary effort is normal. No accessory muscle usage or respiratory distress.     Breath sounds: Normal breath sounds and air entry.  Abdominal:     General: Bowel sounds are normal. There is no distension.     Palpations: Abdomen is soft.     Tenderness: There is no abdominal tenderness. There is no guarding or rebound.  Musculoskeletal: Normal range of motion.     Right lower leg: She exhibits no tenderness.     Left lower leg: She exhibits no tenderness.     Comments: No midline C/T/L spinal tenderness to palpation, no paraspinal muscle tenderness, no deformity, crepitus, or step-off noted. No sign of injury to the neck or back.  Feet:     Right foot:     Protective Sensation: 3 sites tested. 3 sites sensed.     Left foot:     Protective Sensation: 3 sites tested. 3 sites sensed.  Skin:    General: Skin is  warm and dry.  Neurological:     Mental Status: She is alert and oriented to person, place, and time.     GCS: GCS eye subscore is 4. GCS verbal subscore is 5. GCS motor subscore is 6.     Comments: Mental Status: Alert, oriented, thought content appropriate, able to give a coherent history. Speech fluent without evidence of aphasia. Able to follow 2 step commands without difficulty. Cranial Nerves: II: Peripheral visual fields grossly normal, pupils equal, round, reactive to light III,IV, VI: ptosis not present, extra-ocular motions intact bilaterally V,VII: smile symmetric, eyebrows raise symmetric, facial light touch sensation equal VIII: hearing grossly normal to voice X: uvula elevates symmetrically XI: bilateral shoulder shrug symmetric and strong XII: midline tongue extension without fassiculations Motor: Normal tone. 5/5 strength in upper and lower extremities bilaterally including strong and equal grip strength and dorsiflexion/plantar flexion Sensory: Sensation intact to light touch in all extremities.Negative Romberg.  Deep Tendon Reflexes: 2+ and symmetric patella Cerebellar: normal finger-to-nose with bilateral upper extremities. Normal heel-to -shin balance bilaterally of the lower extremity. No pronator drift.  Gait: normal gait and balance CV: distal pulses palpable throughout  Psychiatric:        Mood and Affect: Mood normal.        Behavior: Behavior normal. Behavior is cooperative.    ED Treatments / Results  Labs (all labs ordered are listed, but only abnormal results are displayed) Labs Reviewed  BASIC METABOLIC PANEL - Abnormal; Notable for the following components:      Result Value   Glucose, Bld 116 (*)    All other components within normal limits  CBC - Abnormal; Notable for the following components:   RBC 5.23 (*)    Hemoglobin 11.9 (*)    MCV 73.0 (*)    MCH 22.8 (*)    RDW 18.6 (*)    Platelets 495 (*)    All other components within  normal limits  URINALYSIS, ROUTINE W REFLEX MICROSCOPIC - Abnormal; Notable for the following components:   Color, Urine AMBER (*)  APPearance CLOUDY (*)    Hgb urine dipstick LARGE (*)    Protein, ur 100 (*)    Leukocytes,Ua TRACE (*)    RBC / HPF >50 (*)    WBC, UA >50 (*)    All other components within normal limits  URINE CULTURE  CBG MONITORING, ED  I-STAT BETA HCG BLOOD, ED ([facility], [facility], [facility] ONLY)    EKG EKG Interpretation  Date/Time:  Saturday August 13 2018 17:07:40 EDT Ventricular Rate:  82 PR Interval:    QRS Duration: 75 QT Interval:  361 QTC Calculation: 422 R Axis:   72 Text Interpretation:  Sinus rhythm Nonspecific T abnormalities, inferior leads No significant change since last tracing Confirmed by Gwyneth Sprout ([id number]) on 08/13/2018 6:23:58 PM   Radiology No results found.  Procedures Procedures (including critical care time)  Medications Ordered in ED Medications  sodium chloride flush (NS) 0.9 % injection 3 mL (3 mLs Intravenous Given 08/13/18 1734)  diphenhydrAMINE (BENADRYL) injection 12.5 mg (12.5 mg Intravenous Given 08/13/18 1731)  metoCLOPramide (REGLAN) injection 10 mg (10 mg Intravenous Given 08/13/18 1732)     Initial Impression / Assessment and Plan / ED Course  I have reviewed the triage vital signs and the nursing notes.  Pertinent labs & imaging results that were available during my care of the patient were reviewed by me and considered in my medical decision making (see chart for details).    CBC nonacute BMP nonacute CBG 98 Beta-hCG negative Dialysis with greater than 50 WBCs, greater than 50 RBCs, leukocytes, protein and hemoglobin, squamous cells present, no bacteria seen.  Likely secondary to her yeast candidiasis she is without urinary symptoms doubt UTI no indication for antibiotics at this time.  Will send for urine culture. - Patient arrives with only headache, no visual changes or lightheadedness.  Migraine cocktail given with  complete resolution of headache on reassessment, well-appearing and in no acute distress and is requesting discharge. - Suspect that patient's lightheadedness, headache and blurred vision secondary to her new antihypertensive medication today.  Patient appears to have pressure regularly 180-200 systolic, new medication may have caused relative hypotension as cause of her symptoms.  She has no neuro deficits on examination today.  No neck stiffness or meningeal signs.  She reports that she is feeling well and has no complaints at this time.  Doubt hypertensive urgency/emergency, CVA, meningitis, venous sinus thrombosis, hemorrhage, mass or other acute CNS etiologies of her symptoms today.  Patient is afebrile, nontoxic and well-appearing.  Plan of care at this time is to decrease patient's amlodipine from  to 2.5 mg daily and have her call her primary care doctor's office tomorrow morning to schedule a follow-up visit for blood pressure recheck and medication management.  Patient states understanding of plan of care and is agreeable, she is requesting discharge as soon as possible.  PCP follow up strongly encouraged. I have reviewed return precautions including development of fever, nausea/vomiting or neurologic symptoms, vision changes, confusion, lethargy, difficulty speaking/walking, or other new/worsening/concerning symptoms. Patient states understanding of return precautions.  At this time there does not appear to be any evidence of an acute emergency medical condition and the patient appears stable for discharge with appropriate outpatient follow up. Diagnosis was discussed with patient who verbalizes understanding of care plan and is agreeable to discharge. I have discussed return precautions with patient who verbalizes understanding of return precautions. Patient encouraged to follow-up with their PCP. All questions answered.  Patient has been discharged in good  condition.  Patient's case  discussed with Dr. Anitra LauthPlunkett who agrees with plan to discharge with amlodipine 2.5 and primary care provider follow-up.   Note: Portions of this report may have been transcribed using voice recognition software. Every effort was made to ensure accuracy; however, inadvertent computerized transcription errors may still be present. Final Clinical Impressions(s) / ED Diagnoses   Final diagnoses:  Nonintractable headache, unspecified chronicity pattern, unspecified headache type  Elevated blood pressure reading    ED Discharge Orders         Ordered    amLODipine (NORVASC) 2.5 MG tablet  Daily     08/13/18 1855           Elizabeth PalauMorelli,  A, PA-C 08/13/18 1925    Gwyneth SproutPlunkett, [place], MD 08/13/18 2338

## 2018-08-14 LAB — URINE CULTURE: Culture: <10000 — AB

## 2018-08-15 ENCOUNTER — Ambulatory Visit (INDEPENDENT_AMBULATORY_CARE_PROVIDER_SITE_OTHER): Payer: Self-pay | Admitting: Family Medicine

## 2018-08-15 ENCOUNTER — Encounter: Payer: Self-pay | Admitting: Family Medicine

## 2018-08-15 ENCOUNTER — Other Ambulatory Visit: Payer: Self-pay

## 2018-08-15 VITALS — BP 140/82 | HR 104

## 2018-08-15 DIAGNOSIS — R52 Pain, unspecified: Secondary | ICD-10-CM

## 2018-08-15 DIAGNOSIS — I10 Essential (primary) hypertension: Secondary | ICD-10-CM

## 2018-08-15 DIAGNOSIS — E119 Type 2 diabetes mellitus without complications: Secondary | ICD-10-CM

## 2018-08-15 DIAGNOSIS — Z0000 Encounter for general adult medical examination without abnormal findings: Secondary | ICD-10-CM

## 2018-08-15 DIAGNOSIS — E1169 Type 2 diabetes mellitus with other specified complication: Secondary | ICD-10-CM

## 2018-08-15 DIAGNOSIS — E1159 Type 2 diabetes mellitus with other circulatory complications: Secondary | ICD-10-CM

## 2018-08-15 LAB — POCT GLYCOSYLATED HEMOGLOBIN (HGB A1C): HbA1c, POC (controlled diabetic range): 8.1 % — AB (ref 0.0–7.0)

## 2018-08-15 MED ORDER — NAPROXEN 500 MG PO TABS: 500.0000 mg | ORAL_TABLET | Freq: Two times a day (BID) | ORAL | 0 refills | Status: DC

## 2018-08-15 MED ORDER — TETANUS-DIPHTH-ACELL PERTUSSIS 5-2.5-18.5 LF-MCG/0.5 IM SUSP: 0.5000 mL | Freq: Once | INTRAMUSCULAR | 0 refills | Status: AC

## 2018-08-15 MED ORDER — LISINOPRIL 10 MG PO TABS: 10.0000 mg | ORAL_TABLET | Freq: Every day | ORAL | 0 refills | Status: DC

## 2018-08-15 MED ORDER — METFORMIN HCL 1000 MG PO TABS: 1000.0000 mg | ORAL_TABLET | Freq: Two times a day (BID) | ORAL | 0 refills | Status: DC

## 2018-08-15 NOTE — Patient Instructions (Signed)
It was great meeting you today!  I think given your diabetes start you on lisinopril for your blood pressure is a better option.  It is a 1 time a day medication.  I gave you a 30-day supply.  I do recommend you follow-up in 3 to 4 weeks for a recheck as well as some lab work.  I gave you a printed prescription for a tetanus shot.  You can probably get this for free from the health department or a highly discounted rate at the pharmacy.  In regards to your pain I think trying an over the counter topical nonsteroidal anti-inflammatory agent is a good idea.  I gave you a handout for this.

## 2018-08-18 DIAGNOSIS — Z0000 Encounter for general adult medical examination without abnormal findings: Secondary | ICD-10-CM | POA: Insufficient documentation

## 2018-08-18 DIAGNOSIS — E1159 Type 2 diabetes mellitus with other circulatory complications: Secondary | ICD-10-CM | POA: Insufficient documentation

## 2018-08-18 HISTORY — DX: Encounter for general adult medical examination without abnormal findings: Z00.00

## 2018-08-18 NOTE — Assessment & Plan Note (Signed)
BP 140/82 in clinic.  Does not seem likely that such a minor increase in her amlodipine caused her headache blurry vision.  I do think that she would benefit from a ACE inhibitor given her diabetes.  We will stop amlodipine and start her on lisinopril 10 mg at bedtime.  Patient to follow-up in around 3 to 4 weeks for check, and possible lab work at that time. -Stop amlodipine -Start lisinopril 10 mg -follow-up in 1 month for recheck.

## 2018-08-18 NOTE — Assessment & Plan Note (Addendum)
Recheck A1c.  Last point-of-care was 8.2.  At this point it is 8.1.  Patient has been taking her metformin as prescribed as well as her glipizide.  It would likely be beneficial for patient to increase her glipizide to 10 mg.  Potentially even get glipizide XL for daily dosing.  Will discuss with patient PCP before making a change.  Change her blood pressure medication from amlodipine to lisinopril for renal protection. -A1c stable -Likely could increase glipizide to 10 mg -Continue metformin 1 g twice daily -Started lisinopril 10 mg

## 2018-08-18 NOTE — Assessment & Plan Note (Signed)
Overdue for Pneumovax, Tdap, ophthalmology exam.  Patient preferred to defer Pneumovax at this time.  She is going to get set with optometrist for her ophthalmology exam.  Gave prescription for Tdap, patient had this done at health department.

## 2018-08-18 NOTE — Progress Notes (Signed)
HPI 33 year old female who presents for hypertension follow-up.  Patient was seen on 08/13/2018 admitted coverage department for lightheadedness, headache, and blurry vision.  Her amlodipine had recently been increased to 5 mg from 2.5 mg.  The provider felt that this minor increase was responsible for her symptoms.  Patient presents as follow-up for this.  Patient is overdue for Tdap, pneumococcal vaccine, and optho exam.  She she is interested in setting up an optho exam with her optometrist.  She is also interested in getting a Tdap vaccine today, and would like to defer getting a Pneumovax.  Patient has family-planning Medicaid so I gave her printed prescription for this to get this filled at the health department.  Patient is also having increasing pain "all over".  She would like something topical to help with the pain.  CC: Hypertension follow-up   ROS:   Review of Systems See HPI for ROS.   CC, SH/smoking status, and VS noted  Objective: BP 140/82   Pulse (!) 104   LMP 08/13/2018   SpO2 100%  Gen: Very pleasant 33 year old African-American female, no acute distress CV: RRR, no murmur Resp: CTAB, no wheezes, non-labored Abd: SNTND, BS present, no guarding or organomegaly Neuro: Alert and oriented, Speech clear, No gross deficits   Assessment and plan:  DM2 (diabetes mellitus, type 2) Recheck A1c.  Last point-of-care was 8.2.  At this point it is 8.1.  Patient has been taking her metformin as prescribed as well as her glipizide.  It would likely be beneficial for patient to increase her glipizide to 10 mg.  Potentially even get glipizide XL for daily dosing.  Will discuss with patient PCP before making a change.  Change her blood pressure medication from amlodipine to lisinopril for renal protection. -A1c stable -Likely could increase glipizide to 10 mg -Continue metformin 1 g twice daily -Started lisinopril 10 mg  Hypertension associated with diabetes ([facility]) BP 140/82  in clinic.  Does not seem likely that such a minor increase in her amlodipine caused her headache blurry vision.  I do think that she would benefit from a ACE inhibitor given her diabetes.  We will stop amlodipine and start her on lisinopril 10 mg at bedtime.  Patient to follow-up in around 3 to 4 weeks for check, and possible lab work at that time. -Stop amlodipine -Start lisinopril 10 mg -follow-up in 1 month for recheck.  Healthcare maintenance Overdue for Pneumovax, Tdap, ophthalmology exam.  Patient preferred to defer Pneumovax at this time.  She is going to get set with optometrist for her ophthalmology exam.  Gave prescription for Tdap, patient had this done at health department.   Orders Placed This Encounter  Procedures  . Hemoglobin A1c  . POCT HgB A1C (CPT 83036)    Meds ordered this encounter  Medications  . lisinopril (ZESTRIL) 10 MG tablet    Sig: Take 1 tablet (10 mg total) by mouth at bedtime.    Dispense:  30 tablet    Refill:  0  . Tdap (BOOSTRIX) 5-2.5-18.5 LF-MCG/0.5 injection    Sig: Inject 0.5 mLs into the muscle once for 1 dose.    Dispense:  0.5 mL    Refill:  0  . naproxen (NAPROSYN) 500 MG tablet    Sig: Take 1 tablet (500 mg total) by mouth 2 (two) times daily with a meal. Prn pain    Dispense:  60 tablet    Refill:  0  . metFORMIN (GLUCOPHAGE) 1000 MG tablet  Sig: Take 1 tablet (1,000 mg total) by mouth 2 (two) times daily with a meal.    Dispense:  180 tablet    Refill:  0     Myrene BuddyJacob Lolah Coghlan MD PGY-3 Family Medicine Resident  08/18/2018 12:07 PM

## 2018-09-07 ENCOUNTER — Ambulatory Visit (INDEPENDENT_AMBULATORY_CARE_PROVIDER_SITE_OTHER): Payer: Self-pay | Admitting: *Deleted

## 2018-09-07 ENCOUNTER — Other Ambulatory Visit: Payer: Self-pay

## 2018-09-07 DIAGNOSIS — Z111 Encounter for screening for respiratory tuberculosis: Secondary | ICD-10-CM

## 2018-09-07 NOTE — Progress Notes (Signed)
Patient is here for a PPD placement.  PPD placed in Left forearm.  Patient will return 06/09/18 to have PPD read. Christen Bame, CMA

## 2018-09-09 ENCOUNTER — Other Ambulatory Visit: Payer: Self-pay

## 2018-09-09 ENCOUNTER — Ambulatory Visit: Payer: Medicaid Other | Admitting: *Deleted

## 2018-09-09 DIAGNOSIS — Z111 Encounter for screening for respiratory tuberculosis: Secondary | ICD-10-CM

## 2018-09-09 LAB — TB SKIN TEST
Induration: 0 mm
TB Skin Test: NEGATIVE

## 2018-09-09 NOTE — Progress Notes (Signed)
Patient is here for a PPD read.  It was placed on 09/07/18 in the left forearm @ 8:55 am.    PPD RESULTS:  Result: negative Induration: 0 mm  Letter created and given to patient for documentation purposes. Christen Bame, CMA

## 2019-01-28 ENCOUNTER — Encounter (HOSPITAL_COMMUNITY): Payer: Self-pay | Admitting: Emergency Medicine

## 2019-01-28 ENCOUNTER — Emergency Department (HOSPITAL_COMMUNITY)
Admission: EM | Admit: 2019-01-28 | Discharge: 2019-01-28 | Disposition: A | Discharge status: Home / Self Care | Payer: Medicaid Other | Attending: Emergency Medicine | Admitting: Emergency Medicine

## 2019-01-28 ENCOUNTER — Other Ambulatory Visit: Payer: Self-pay

## 2019-01-28 DIAGNOSIS — I10 Essential (primary) hypertension: Secondary | ICD-10-CM | POA: Insufficient documentation

## 2019-01-28 DIAGNOSIS — E119 Type 2 diabetes mellitus without complications: Secondary | ICD-10-CM | POA: Insufficient documentation

## 2019-01-28 DIAGNOSIS — Z7984 Long term (current) use of oral hypoglycemic drugs: Secondary | ICD-10-CM | POA: Insufficient documentation

## 2019-01-28 DIAGNOSIS — Z87891 Personal history of nicotine dependence: Secondary | ICD-10-CM | POA: Insufficient documentation

## 2019-01-28 DIAGNOSIS — R1084 Generalized abdominal pain: Secondary | ICD-10-CM

## 2019-01-28 DIAGNOSIS — Z79899 Other long term (current) drug therapy: Secondary | ICD-10-CM | POA: Insufficient documentation

## 2019-01-28 DIAGNOSIS — R102 Pelvic and perineal pain: Secondary | ICD-10-CM | POA: Insufficient documentation

## 2019-01-28 LAB — CBC WITH DIFFERENTIAL/PLATELET
Abs Immature Granulocytes: 0.03 10*3/uL (ref 0.00–0.07)
Basophils Absolute: 0.1 10*3/uL (ref 0.0–0.1)
Basophils Relative: 1 %
Eosinophils Absolute: 0.2 10*3/uL (ref 0.0–0.5)
Eosinophils Relative: 2 %
HCT: 38.2 % (ref 36.0–46.0)
Hemoglobin: 12 g/dL (ref 12.0–15.0)
Immature Granulocytes: 0 %
Lymphocytes Relative: 49 %
Lymphs Abs: 5.2 10*3/uL — ABNORMAL HIGH (ref 0.7–4.0)
MCH: 22.6 pg — ABNORMAL LOW (ref 26.0–34.0)
MCHC: 31.4 g/dL (ref 30.0–36.0)
MCV: 72.1 fL — ABNORMAL LOW (ref 80.0–100.0)
Monocytes Absolute: 0.8 10*3/uL (ref 0.1–1.0)
Monocytes Relative: 7 %
Neutro Abs: 4.3 10*3/uL (ref 1.7–7.7)
Neutrophils Relative %: 41 %
Platelets: 510 10*3/uL — ABNORMAL HIGH (ref 150–400)
RBC: 5.3 MIL/uL — ABNORMAL HIGH (ref 3.87–5.11)
RDW: 19.7 % — ABNORMAL HIGH (ref 11.5–15.5)
WBC: 10.6 10*3/uL — ABNORMAL HIGH (ref 4.0–10.5)
nRBC: 0 % (ref 0.0–0.2)

## 2019-01-28 LAB — WET PREP, GENITAL
Sperm: NONE SEEN
Trich, Wet Prep: NONE SEEN
Yeast Wet Prep HPF POC: NONE SEEN

## 2019-01-28 LAB — COMPREHENSIVE METABOLIC PANEL
ALT: 15 U/L (ref 0–44)
AST: 14 U/L — ABNORMAL LOW (ref 15–41)
Albumin: 3.6 g/dL (ref 3.5–5.0)
Alkaline Phosphatase: 40 U/L (ref 38–126)
Anion gap: 9 (ref 5–15)
BUN: 9 mg/dL (ref 6–20)
CO2: 21 mmol/L — ABNORMAL LOW (ref 22–32)
Calcium: 8.8 mg/dL — ABNORMAL LOW (ref 8.9–10.3)
Chloride: 105 mmol/L (ref 98–111)
Creatinine, Ser: 0.71 mg/dL (ref 0.44–1.00)
GFR calc Af Amer: >60 mL/min (ref >60)
GFR calc non Af Amer: >60 mL/min (ref >60)
Glucose, Bld: 109 mg/dL — ABNORMAL HIGH (ref 70–99)
Potassium: 3.6 mmol/L (ref 3.5–5.1)
Sodium: 135 mmol/L (ref 135–145)
Total Bilirubin: <0.1 mg/dL — ABNORMAL LOW (ref 0.3–1.2)
Total Protein: 7.5 g/dL (ref 6.5–8.1)

## 2019-01-28 LAB — URINALYSIS, ROUTINE W REFLEX MICROSCOPIC
Bilirubin Urine: NEGATIVE
Glucose, UA: NEGATIVE mg/dL
Hgb urine dipstick: NEGATIVE
Ketones, ur: NEGATIVE mg/dL
Leukocytes,Ua: NEGATIVE
Nitrite: NEGATIVE
Protein, ur: NEGATIVE mg/dL
Specific Gravity, Urine: 1.016 (ref 1.005–1.030)
pH: 8 (ref 5.0–8.0)

## 2019-01-28 LAB — I-STAT BETA HCG BLOOD, ED (MC, WL, AP ONLY): I-stat hCG, quantitative: <5 m[IU]/mL (ref <5)

## 2019-01-28 LAB — LIPASE, BLOOD: Lipase: 20 U/L (ref 11–51)

## 2019-01-28 MED ORDER — KETOROLAC TROMETHAMINE 15 MG/ML IJ SOLN
15.0000 mg | Freq: Once | INTRAMUSCULAR | Status: AC
  Administered 2019-01-28: 19:00:00 15 mg via INTRAVENOUS
  Filled 2019-01-28: qty 1

## 2019-01-28 NOTE — ED Provider Notes (Signed)
[facility] EMERGENCY DEPARTMENT Provider Note   CSN: [id number] Arrival date & time: 01/28/19  1445     History Chief Complaint  Patient presents with  . Dysuria    Lisa Cooley is a 33 y.o. female.  Patient is a 33 year old female with past medical history of obesity, diabetes, hypertension presenting to the emergency department for difficulty urinating and abdominal pain.  Patient reports that this morning she felt the urge to urinate and had some vaginal pressure but was unable to urinate.  This lasted for about 2 hours.  Reports that she was able to give [place] a small urine sample initially but then felt like she could completely empty her bladder after that.  Reports that she is having some pressure in the lower part of her belly which is radiating into her back.  Denies any hematuria, vaginal discharge, vaginal bleeding.  Last menstrual cycle was December 8-13.  She reports unprotected sex since then.  Denies any nausea, vomiting, diarrhea.  She is asking for something to eat.         Past Medical History:  Diagnosis Date  . Diabetes mellitus    Dx in [place] but not on meds  . Headache(784.0)   . Hypertension     Patient Active Problem List   Diagnosis Date Noted  . Hypertension associated with diabetes ([place]) 08/18/2018  . Healthcare maintenance 08/18/2018  . Screening for malignant neoplasm of cervix 12/20/2017  . Positive depression screening 12/20/2017  . Unprotected sex 09/17/2017  . 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 Screen for STD (sexually transmitted disease) 09/17/2017  . Diabetes mellitus type 2 in obese ([place]) 12/31/2016  . Epigastric pain 08/13/2011  . DM2 (diabetes mellitus, type 2) ([place]) 08/12/2011  . Morbid obesity ([place]) 08/12/2011  . Infertility, female 08/12/2011  . Irregular periods 08/12/2011    Past Surgical History:  Procedure Laterality Date  . NO PAST SURGERIES       OB History    Gravida  0   Para  0   Term  0   Preterm      AB  0   Living  0     SAB  0   TAB      Ectopic      Multiple      Live Births              Family History  Problem Relation Age of Onset  . Cancer Father   . Heart disease Sister   . Other Neg Hx     Social History   Tobacco Use  . Smoking status: Former Smoker    Packs/day: 0.25    Types: Cigarettes    Quit date: 04/20/2014    Years since quitting: 4.7  . Smokeless tobacco: Never Used  Substance Use Topics  . Alcohol use: No    Alcohol/week: 0.0 standard drinks  . Drug use: Yes    Types: Marijuana    Comment: last use 2 days ago    Home Medications Prior to Admission medications   Medication Sig Start Date End Date Taking? Authorizing Provider  albuterol (PROVENTIL HFA;VENTOLIN HFA) 108 (90 Base) MCG/ACT inhaler Inhale 2 puffs into the lungs every 6 (six) hours as needed for wheezing or shortness of breath. 12/29/16   Alfonse Spruce, FNP  DULoxetine (CYMBALTA) 60 MG capsule Take 1 capsule (60 mg total) by mouth daily. 12/20/17   Benay Pike, MD  gabapentin (NEURONTIN)  300 MG capsule Take 1 capsule (300 mg total) by mouth 3 (three) times daily as needed. 12/20/17   Sandre Kittylson, Daniel K, MD  glipiZIDE (GLUCOTROL) 5 MG tablet Take 1 tablet (5 mg total) by mouth daily before breakfast. 12/20/17   Sandre Kittylson, Daniel K, MD  lisinopril (ZESTRIL) 10 MG tablet Take 1 tablet (10 mg total) by mouth at bedtime. 08/15/18   Myrene BuddyFletcher, Jacob, MD  metFORMIN (GLUCOPHAGE) 1000 MG tablet Take 1 tablet (1,000 mg total) by mouth 2 (two) times daily with a meal. 08/15/18   Myrene BuddyFletcher, Jacob, MD  naproxen (NAPROSYN) 500 MG tablet Take 1 tablet (500 mg total) by mouth 2 (two) times daily with a meal. Prn pain 08/15/18   Myrene BuddyFletcher, Jacob, MD    Allergies    Patient has no known allergies.  Review of Systems   Review of Systems  Constitutional: Negative for chills and fever.  Respiratory: Negative for cough and shortness of breath.     Cardiovascular: Negative for chest pain.  Gastrointestinal: Positive for abdominal pain and constipation (had BM this morning). Negative for blood in stool, diarrhea, nausea, rectal pain and vomiting.  Genitourinary: Positive for decreased urine volume, difficulty urinating, dysuria and pelvic pain. Negative for frequency, genital sores, hematuria, menstrual problem, urgency, vaginal bleeding, vaginal discharge and vaginal pain.  Musculoskeletal: Positive for back pain.  Skin: Negative for rash and wound.  Neurological: Negative for dizziness and light-headedness.    Physical Exam Updated Vital Signs BP (!) 161/104 (BP Location: Right Arm)   Pulse 94   Temp 98.3 F (36.8 C) (Oral)   Resp 20   LMP 01/10/2019   SpO2 99%   Physical Exam Vitals and nursing note reviewed.  Constitutional:      Appearance: Normal appearance.  HENT:     Head: Normocephalic.     Nose: Nose normal.     Mouth/Throat:     Mouth: Mucous membranes are moist.  Eyes:     Conjunctiva/sclera: Conjunctivae normal.  Pulmonary:     Effort: Pulmonary effort is normal.     Breath sounds: Normal breath sounds.  Skin:    General: Skin is dry.  Neurological:     Mental Status: She is alert.  Psychiatric:        Mood and Affect: Mood normal.     ED Results / Procedures / Treatments   Labs (all labs ordered are listed, but only abnormal results are displayed) Labs Reviewed  WET PREP, GENITAL - Abnormal; Notable for the following components:      Result Value   Clue Cells Wet Prep HPF POC PRESENT (*)    WBC, Wet Prep HPF POC MODERATE (*)    All other components within normal limits  URINALYSIS, ROUTINE W REFLEX MICROSCOPIC - Abnormal; Notable for the following components:   APPearance HAZY (*)    All other components within normal limits  CBC WITH DIFFERENTIAL/PLATELET - Abnormal; Notable for the following components:   WBC 10.6 (*)    RBC 5.30 (*)    MCV 72.1 (*)    MCH 22.6 (*)    RDW 19.7 (*)     Platelets 510 (*)    Lymphs Abs 5.2 (*)    All other components within normal limits  COMPREHENSIVE METABOLIC PANEL - Abnormal; Notable for the following components:   CO2 21 (*)    Glucose, Bld 109 (*)    Calcium 8.8 (*)    AST 14 (*)    Total Bilirubin <0.1 (*)  All other components within normal limits  LIPASE, BLOOD  I-STAT BETA HCG BLOOD, ED ([facility], [facility], [facility] ONLY)  GC/CHLAMYDIA PROBE AMP ([place]) NOT AT [facility]    EKG None  Radiology No results found.  Procedures Procedures (including critical care time)  Medications Ordered in ED Medications  ketorolac (TORADOL) 15 MG/ML injection 15 mg (15 mg Intravenous Given 01/28/19 1848)    ED Course  I have reviewed the triage vital signs and the nursing notes.  Pertinent labs & imaging results that were available during my care of the patient were reviewed by me and considered in my medical decision making (see chart for details).  Clinical Course as of Jan 28 1935  Sat Jan 28, 2019  0998 Patient presenting with abdominal pain and sensation of difficulty urinating.  Patient with unremarkable work-up including pelvic exam, urinalysis, labs.  Patient was significantly improved with Toradol and is now able to urinate and is eating and drinking in the room.  She got up and got dressed and is sitting in the chair and is ready for discharge.  Advised on strict return precautions.   [KM]    Clinical Course User Index [KM] Jeral Pinch   MDM Rules/Calculators/A&P                      Based on review of vitals, medical screening exam, lab work and/or imaging, there does not appear to be an acute, emergent etiology for the patient's symptoms. Counseled pt on good return precautions and encouraged both PCP and ED follow-up as needed.  Prior to discharge, I also discussed incidental imaging findings with patient in detail and advised appropriate, recommended follow-up in detail.  Clinical Impression: 1. Generalized abdominal  pain     Disposition: Discharge  Prior to providing a prescription for a controlled substance, I independently reviewed the patient's recent prescription history on the [place] Controlled Substance Reporting System. The patient had no recent or regular prescriptions and was deemed appropriate for a brief, less than 3 day prescription of narcotic for acute analgesia.  This note was prepared with assistance of Conservation officer, historic buildings. Occasional wrong-word or sound-a-like substitutions may have occurred due to the inherent limitations of voice recognition software.  Final Clinical Impression(s) / ED Diagnoses Final diagnoses:  Generalized abdominal pain    Rx / DC Orders ED Discharge Orders    None       Jeral Pinch 01/28/19 1936    Milagros Loll, MD 01/29/19 1433

## 2019-01-28 NOTE — Discharge Instructions (Signed)
Thank you for allowing me to care for you today. Please return to the emergency department if you have new or worsening symptoms.   

## 2019-01-28 NOTE — ED Notes (Signed)
Bladder scan greatest amount was >325.

## 2019-01-28 NOTE — ED Triage Notes (Signed)
States she has been unable to urinate x 2 hours and is having shooting pain to perineum.  States she urinated without difficulty at 12noon today.

## 2019-01-31 LAB — GC/CHLAMYDIA PROBE AMP (~~LOC~~) NOT AT ARMC
Chlamydia: NEGATIVE
Neisseria Gonorrhea: NEGATIVE

## 2019-02-10 ENCOUNTER — Other Ambulatory Visit: Payer: Self-pay

## 2019-02-10 ENCOUNTER — Encounter (HOSPITAL_COMMUNITY): Payer: Self-pay | Admitting: *Deleted

## 2019-02-10 ENCOUNTER — Emergency Department (HOSPITAL_COMMUNITY)
Admission: EM | Admit: 2019-02-10 | Discharge: 2019-02-10 | Disposition: A | Discharge status: Home / Self Care | Payer: HRSA Program | Attending: Emergency Medicine | Admitting: Emergency Medicine

## 2019-02-10 DIAGNOSIS — I10 Essential (primary) hypertension: Secondary | ICD-10-CM | POA: Diagnosis not present

## 2019-02-10 DIAGNOSIS — Z7984 Long term (current) use of oral hypoglycemic drugs: Secondary | ICD-10-CM | POA: Insufficient documentation

## 2019-02-10 DIAGNOSIS — Z20822 Contact with and (suspected) exposure to covid-19: Secondary | ICD-10-CM | POA: Diagnosis not present

## 2019-02-10 DIAGNOSIS — E119 Type 2 diabetes mellitus without complications: Secondary | ICD-10-CM | POA: Diagnosis not present

## 2019-02-10 DIAGNOSIS — R438 Other disturbances of smell and taste: Secondary | ICD-10-CM | POA: Diagnosis present

## 2019-02-10 DIAGNOSIS — Z79899 Other long term (current) drug therapy: Secondary | ICD-10-CM | POA: Diagnosis not present

## 2019-02-10 DIAGNOSIS — Z87891 Personal history of nicotine dependence: Secondary | ICD-10-CM | POA: Insufficient documentation

## 2019-02-10 NOTE — ED Triage Notes (Signed)
To ED for eval of loss of taste and smell since Wednesday. States her niece who lives with her tested positive for covid over 2 wks ago. Pt has no complaints.

## 2019-02-10 NOTE — Discharge Instructions (Signed)
I have a strong suspicion that you have coronavirus.  You should continue to quarantine for the next week. Your results are pending.  If positive, you receive a phone call.  If negative, you will not.  Either way, you may check online MyChart. If you develop other symptoms, you should treat them symptomatically.  Use Tylenol and ibuprofen as needed for fever or pain. Use cough medicine as needed. Make sure staying well-hydrated water. Return to the emergency room if you develop severe/worsening difficulty breathing or if any new, worsening, or concerning symptoms.

## 2019-02-10 NOTE — ED Provider Notes (Signed)
[facility] EMERGENCY DEPARTMENT Provider Note   CSN: [id number] Arrival date & time: 02/10/19  1315     History Chief Complaint  Patient presents with  . loss of taste    Lisa Cooley is a 34 y.o. female presenting for evaluation of loss of taste and smell.  Patient states she noticed she lost taste and smell 2 days ago.  She reports no other new symptoms.  Denies fevers, chills, sore throat, cough, chest pain, shortness breath, nausea, vomiting abdominal pain, urinary symptoms, abnormal bowel movements.  Patient states her niece who she spent time with this appointment for Covid 2 weeks ago.  Patient reports a history of diabetes, does not check her blood sugars regularly, but reports she does not feel like her blood sugar has been high.  She reports no other medical problems.  HPI     Past Medical History:  Diagnosis Date  . Diabetes mellitus    Dx in [place] but not on meds  . Headache(784.0)   . Hypertension     Patient Active Problem List   Diagnosis Date Noted  . Hypertension associated with diabetes ([place]) 08/18/2018  . Healthcare maintenance 08/18/2018  . Screening for malignant neoplasm of cervix 12/20/2017  . Positive depression screening 12/20/2017  . Unprotected sex 09/17/2017  . 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 Screen for STD (sexually transmitted disease) 09/17/2017  . Diabetes mellitus type 2 in obese ([place]) 12/31/2016  . Epigastric pain 08/13/2011  . DM2 (diabetes mellitus, type 2) ([place]) 08/12/2011  . Morbid obesity ([place]) 08/12/2011  . Infertility, female 08/12/2011  . Irregular periods 08/12/2011    Past Surgical History:  Procedure Laterality Date  . NO PAST SURGERIES       OB History    Gravida  0   Para  0   Term  0   Preterm      AB  0   Living  0     SAB  0   TAB      Ectopic      Multiple      Live Births              Family History  Problem Relation Age of Onset  . Cancer  Father   . Heart disease Sister   . Other Neg Hx     Social History   Tobacco Use  . Smoking status: Former Smoker    Packs/day: 0.25    Types: Cigarettes    Quit date: 04/20/2014    Years since quitting: 4.8  . Smokeless tobacco: Never Used  Substance Use Topics  . Alcohol use: No    Alcohol/week: 0.0 standard drinks  . Drug use: Yes    Types: Marijuana    Comment: last use 2 days ago    Home Medications Prior to Admission medications   Medication Sig Start Date End Date Taking? Authorizing Provider  albuterol (PROVENTIL HFA;VENTOLIN HFA) 108 (90 Base) MCG/ACT inhaler Inhale 2 puffs into the lungs every 6 (six) hours as needed for wheezing or shortness of breath. 12/29/16   Alfonse Spruce, FNP  DULoxetine (CYMBALTA) 60 MG capsule Take 1 capsule (60 mg total) by mouth daily. 12/20/17   Benay Pike, MD  gabapentin (NEURONTIN) 300 MG capsule Take 1 capsule (300 mg total) by mouth 3 (three) times daily as needed. 12/20/17   Benay Pike, MD  glipiZIDE (GLUCOTROL) 5 MG tablet Take 1 tablet (5 mg total)  by mouth daily before breakfast. 12/20/17   Sandre Kitty, MD  lisinopril (ZESTRIL) 10 MG tablet Take 1 tablet (10 mg total) by mouth at bedtime. 08/15/18   Myrene Buddy, MD  metFORMIN (GLUCOPHAGE) 1000 MG tablet Take 1 tablet (1,000 mg total) by mouth 2 (two) times daily with a meal. 08/15/18   Myrene Buddy, MD  naproxen (NAPROSYN) 500 MG tablet Take 1 tablet (500 mg total) by mouth 2 (two) times daily with a meal. Prn pain 08/15/18   Myrene Buddy, MD    Allergies    Patient has no known allergies.  Review of Systems   Review of Systems  Constitutional: Negative for fever.  HENT:       Loss of taste and smell  Respiratory: Negative for cough and shortness of breath.     Physical Exam Updated Vital Signs BP (!) 149/108 (BP Location: Left Arm)   Pulse 96   Temp 98.6 F (37 C) (Oral)   Resp 18   SpO2 98%   Physical Exam Vitals and nursing note  reviewed.  Constitutional:      General: She is not in acute distress.    Appearance: She is well-developed.     Comments: Sitting comfortably in the bed in no acute distress  HENT:     Head: Normocephalic and atraumatic.  Eyes:     Conjunctiva/sclera: Conjunctivae normal.     Pupils: Pupils are equal, round, and reactive to light.  Cardiovascular:     Rate and Rhythm: Normal rate and regular rhythm.     Pulses: Normal pulses.  Pulmonary:     Effort: Pulmonary effort is normal. No respiratory distress.     Breath sounds: Normal breath sounds. No wheezing.     Comments: Speaking in full sentences.  Coarse cough in all fields.  No respiratory distress or signs of accessory muscle use.  On my exam, SPO2 upper 90s on room air Abdominal:     General: There is no distension.     Palpations: Abdomen is soft. There is no mass.     Tenderness: There is no abdominal tenderness. There is no guarding or rebound.  Musculoskeletal:        General: Normal range of motion.     Cervical back: Normal range of motion and neck supple.  Skin:    General: Skin is warm and dry.     Capillary Refill: Capillary refill takes less than 2 seconds.  Neurological:     Mental Status: She is alert and oriented to person, place, and time.     ED Results / Procedures / Treatments   Labs (all labs ordered are listed, but only abnormal results are displayed) Labs Reviewed  NOVEL CORONAVIRUS, NAA (HOSP ORDER, SEND-OUT TO REF LAB; TAT 18-24 HRS)    EKG None  Radiology No results found.  Procedures Procedures (including critical care time)  Medications Ordered in ED Medications - No data to display  ED Course  I have reviewed the triage vital signs and the nursing notes.  Pertinent labs & imaging results that were available during my care of the patient were reviewed by me and considered in my medical decision making (see chart for details).    MDM Rules/Calculators/A&P                       Patient presenting for evaluation of loss of taste and smell.  Physical examination, she appears nontoxic.  No signs of respiratory distress.  I have a high suspicion that she has coronavirus, especially if she had a positive contact.  Patient without shortness of breath or cough.  Bowel she does have a single documented sat of 90%, she has other sats documented in the upper 90s, and her sats on room air when I was in the room were in the upper 90s.  As such, I do not like she needs admission to the hospital at this time.  As she is without cough, shortness of breath, and with normal pulmonary exam, I do not believe x-ray would be beneficial.  We will perform send out testing.  Discussed importance of quarantine and monitoring of symptoms.  At this time, patient appears safe for discharge.  Return precautions given.  Patient states she understands and agrees to plan.  Lisa Cooley was evaluated in Emergency Department on 02/10/2019 for the symptoms described in the history of present illness. She was evaluated in the context of the global COVID-19 pandemic, which necessitated consideration that the patient might be at risk for infection with the SARS-CoV-2 virus that causes COVID-19. Institutional protocols and algorithms that pertain to the evaluation of patients at risk for COVID-19 are in a state of rapid change based on information released by regulatory bodies including the CDC and federal and state organizations. These policies and algorithms were followed during the patient's care in the ED.  Final Clinical Impression(s) / ED Diagnoses Final diagnoses:  Suspected COVID-19 virus infection    Rx / DC Orders ED Discharge Orders    None       Alveria Apley, PA-C 02/10/19 1727    Sabas Sous, MD 02/10/19 1956

## 2019-02-11 LAB — NOVEL CORONAVIRUS, NAA (HOSP ORDER, SEND-OUT TO REF LAB; TAT 18-24 HRS): SARS-CoV-2, NAA: NOT DETECTED

## 2019-02-28 ENCOUNTER — Encounter: Payer: Self-pay | Admitting: Family Medicine

## 2019-03-08 ENCOUNTER — Other Ambulatory Visit: Payer: Self-pay | Admitting: Family Medicine

## 2019-03-08 MED ORDER — METHOCARBAMOL 750 MG PO TABS: 750.0000 mg | ORAL_TABLET | Freq: Three times a day (TID) | ORAL | 1 refills | Status: DC | PRN

## 2019-03-22 ENCOUNTER — Encounter (HOSPITAL_COMMUNITY): Payer: Self-pay

## 2019-03-22 ENCOUNTER — Other Ambulatory Visit: Payer: Self-pay

## 2019-03-22 ENCOUNTER — Ambulatory Visit (HOSPITAL_COMMUNITY)
Admission: EM | Admit: 2019-03-22 | Discharge: 2019-03-22 | Disposition: A | Discharge status: Home / Self Care | Payer: Medicaid Other | Attending: Family Medicine | Admitting: Family Medicine

## 2019-03-22 DIAGNOSIS — N898 Other specified noninflammatory disorders of vagina: Secondary | ICD-10-CM

## 2019-03-22 DIAGNOSIS — R519 Headache, unspecified: Secondary | ICD-10-CM

## 2019-03-22 DIAGNOSIS — I10 Essential (primary) hypertension: Secondary | ICD-10-CM

## 2019-03-22 MED ORDER — DICLOFENAC SODIUM 75 MG PO TBEC: 75.0000 mg | DELAYED_RELEASE_TABLET | Freq: Two times a day (BID) | ORAL | 0 refills | Status: DC

## 2019-03-22 MED ORDER — FLUCONAZOLE 150 MG PO TABS: ORAL_TABLET | ORAL | 0 refills | Status: DC

## 2019-03-22 NOTE — Discharge Instructions (Addendum)
Follow up with your primary care doctor or here within 48-72 hours. Do your best to ensure adequate rest. If not allergic, take acetaminophen (Tylenol) every 4-6 hours as needed for discomfort.  Please seek prompt medical care if: You have: A very bad (severe) headache that is not helped by medicine. Trouble walking or weakness in your arms and legs. Clear or bloody fluid coming from your nose or ears. Changes in your seeing (vision). Jerky movements that you cannot control (seizure). You throw up (vomit). Your headaches get worse. You lose balance. Your speech is slurred. You pass out. You are sleepier and have trouble staying awake. The black centers of your eyes (pupils) change in size.  These symptoms may be an emergency. Do not wait to see if the symptoms will go away. Get medical help right away. Call your local emergency services. Do not drive yourself to the hospital.   Your blood pressure was noted to be elevated during your visit today. You may return here within the next few days to recheck if unable to see your primary care doctor. Ensure you are taking your medications as prescribed.  BP (!) 152/102 (BP Location: Left Arm)   Pulse 100   Temp 99.3 F (37.4 C) (Oral)   Resp 17   SpO2 100%   We have sent testing for sexually transmitted infections. We will notify you of any positive results once they are received. If required, we will prescribe any medications you might need.  Please refrain from all sexual activity for at least the next seven days.

## 2019-03-22 NOTE — ED Provider Notes (Signed)
[facility]   [id number] 03/22/19 Arrival Time: 1205  ASSESSMENT & PLAN:  1. Frontal headache   2. Vaginal irritation   3. Elevated blood pressure reading in office with diagnosis of hypertension     Normal neurological exam. Afebrile without nuchal rigidity. Discussed. Current presentation and symptoms are consistent with prior migraines and are not consistent with SAH, ICH, meningitis, or temporal arteritis. No indication for neurodiagnostic workup at this time. Question tension headaches, eye strain related. Is looking at a screen much of the day. Discussed.  Begin: Meds ordered this encounter  Medications  . fluconazole (DIFLUCAN) 150 MG tablet    Sig: Take one tablet by mouth as a single dose. May repeat in 3 days if symptoms persist.    Dispense:  2 tablet    Refill:  0  . diclofenac (VOLTAREN) 75 MG EC tablet    Sig: Take 1 tablet (75 mg total) by mouth 2 (two) times daily.    Dispense:  14 tablet    Refill:  0    No empiric gonorrhea/chlamydia treatment desired. Will treat empirically for yeast.   Discharge Instructions     Follow up with your primary care doctor or here within 48-72 hours. Do your best to ensure adequate rest. If not allergic, take acetaminophen (Tylenol) every 4-6 hours as needed for discomfort.  Please seek prompt medical care if:  You have: ? A very bad (severe) headache that is not helped by medicine. ? Trouble walking or weakness in your arms and legs. ? Clear or bloody fluid coming from your nose or ears. ? Changes in your seeing (vision). ? Jerky movements that you cannot control (seizure).  You throw up (vomit).  Your headaches get worse.  You lose balance.  Your speech is slurred.  You pass out.  You are sleepier and have trouble staying awake.  The black centers of your eyes (pupils) change in size.  These symptoms may be an emergency. Do not wait to see if the symptoms will go away. Get medical help right away.  Call your local emergency services. Do not drive yourself to the hospital.   Your blood pressure was noted to be elevated during your visit today. You may return here within the next few days to recheck if unable to see your primary care doctor. Ensure you are taking your medications as prescribed.  BP (!) 152/102 (BP Location: Left Arm)   Pulse 100   Temp 99.3 F (37.4 C) (Oral)   Resp 17   SpO2 100%   We have sent testing for sexually transmitted infections. We will notify you of any positive results once they are received. If required, we will prescribe any medications you might need.  Please refrain from all sexual activity for at least the next seven days.       Recommend: Follow-up Information    Winfrey, Harlen Labs, MD.   Specialty: Family Medicine Why: If worsening or failing to improve as anticipated. And to recheck your blood pressure. Contact information: [address] [place] [place] [address] [phone]            Reviewed expectations re: course of current medical issues. Questions answered. Outlined signs and symptoms indicating need for more acute intervention. Patient verbalized understanding. After Visit Summary given.   SUBJECTIVE: History from: Patient. Patient is able to give a clear and coherent history.  Lisa Cooley is a 34 y.o. female who presents with complaint of occasional bilateral non-radiating temporal/frontal  headaches. Onset gradual, first noted several days ago; h/o similar headaches but usu don't last this long. Precipitating factors include: none which have been determined. Associated symptoms: Preceding aura: no. Nausea/vomiting: no. No specific vision changes currently. Increased sensitivity to light and to noises: no. Fever: no. Sinus pressure/congestion: no. Extremity weakness: no. Home treatment has included OTC analgesics  with little improvement. No current headache "but can feel it coming on a little".  Headaches do not wake her at night. Denies depression, dizziness, loss of balance, numbness of extremities, speech difficulties. No head injury reported. Ambulatory without difficulty. No recent travel.  Also reports vaginal irritation first noticed a few d ago. Describes as itching. No rash or lesions. No vaginal discharge. No urinary symptoms. Without pelvic or abdominal pain. Sexually active with two female partners. No concern for STD voiced.  Increased blood pressure noted today. Reports that she is treated for HTN. She reports not taking medications regularly as instructed, no chest pain on exertion, no dyspnea on exertion, no swelling of ankles, no orthostatic dizziness or lightheadedness, no orthopnea or paroxysmal nocturnal dyspnea and no palpitations.   OBJECTIVE:  Vitals:   03/22/19 1238  BP: (!) 152/102  Pulse: 100  Resp: 17  Temp: 99.3 F (37.4 C)  TempSrc: Oral  SpO2: 100%    General appearance: alert; NAD HENT: normocephalic; atraumatic Eyes: PERRLA; EOMI; conjunctivae normal Neck: supple with FROM Lungs: clear to auscultation bilaterally; unlabored respirations Heart: regular rate and rhythm Extremities: no edema; symmetrical with no gross deformities Skin: warm and dry Neurologic: alert; speech is fluent and clear without dysarthria or aphasia; CN 2-12 grossly intact; no facial droop; normal gait; normal symmetric reflexes; normal extremity strength and sensation throughout; bilateral upper and lower extremity sensation is grossly intact with 5/5 symmetric strength; normal grip strength; normal plantar and dorsi flexion bilaterally; normal finger to nose bilaterally; negative pronator drift; negative Romberg sign Psychological: alert and cooperative; normal mood and affect  Labs: Labs Reviewed  CERVICOVAGINAL ANCILLARY ONLY     No Known Allergies  Past Medical History:  Diagnosis Date  . Diabetes mellitus    Dx in [place] but not on meds  . Headache(784.0)    . Hypertension    Social History   Socioeconomic History  . Marital status: Single    Spouse name: Not on file  . Number of children: Not on file  . Years of education: Not on file  . Highest education level: Not on file  Occupational History  . Not on file  Tobacco Use  . Smoking status: Former Smoker    Packs/day: 0.25    Types: Cigarettes    Quit date: 04/20/2014    Years since quitting: 4.9  . Smokeless tobacco: Never Used  Substance and Sexual Activity  . Alcohol use: No    Alcohol/week: 0.0 standard drinks  . Drug use: Yes    Types: Marijuana    Comment: last use 2 days ago  . Sexual activity: Yes    Birth control/protection: None  Other Topics Concern  . Not on file  Social History Narrative  . Not on file   Social Determinants of Health   Financial Resource Strain:   . Difficulty of Paying Living Expenses: Not on file  Food Insecurity:   . Worried About Charity fundraiser in the Last Year: Not on file  . Ran Out of Food in the Last Year: Not on file  Transportation Needs:   . Lack of Transportation (Medical):  Not on file  . Lack of Transportation (Non-Medical): Not on file  Physical Activity:   . Days of Exercise per Week: Not on file  . Minutes of Exercise per Session: Not on file  Stress:   . Feeling of Stress : Not on file  Social Connections:   . Frequency of Communication with Friends and Family: Not on file  . Frequency of Social Gatherings with Friends and Family: Not on file  . Attends Religious Services: Not on file  . Active Member of Clubs or Organizations: Not on file  . Attends Banker Meetings: Not on file  . Marital Status: Not on file  Intimate Partner Violence:   . Fear of Current or Ex-Partner: Not on file  . Emotionally Abused: Not on file  . Physically Abused: Not on file  . Sexually Abused: Not on file   Family History  Problem Relation Age of Onset  . Cancer Father   . Heart disease Sister   . Other Neg Hx      Past Surgical History:  Procedure Laterality Date  . NO PAST SURGERIES       Mardella Layman, MD 03/22/19 1313

## 2019-03-22 NOTE — ED Triage Notes (Signed)
Pt presents with vaginal irritation over the past few days; pt also states she has had an ongoing headache but has not been taking BP medication as prescribed.

## 2019-03-24 ENCOUNTER — Encounter: Payer: Self-pay | Admitting: Family Medicine

## 2019-03-24 LAB — CERVICOVAGINAL ANCILLARY ONLY
Bacterial vaginitis: POSITIVE — AB
Candida vaginitis: POSITIVE — AB
Chlamydia: NEGATIVE
Neisseria Gonorrhea: NEGATIVE
Trichomonas: POSITIVE — AB

## 2019-03-25 ENCOUNTER — Other Ambulatory Visit: Payer: Self-pay

## 2019-03-25 ENCOUNTER — Ambulatory Visit (HOSPITAL_COMMUNITY)
Admission: EM | Admit: 2019-03-25 | Discharge: 2019-03-25 | Disposition: A | Discharge status: Home / Self Care | Payer: Medicaid Other

## 2019-03-25 ENCOUNTER — Telehealth (HOSPITAL_COMMUNITY): Payer: Self-pay | Admitting: Family Medicine

## 2019-03-25 MED ORDER — METRONIDAZOLE 500 MG PO TABS: 500.0000 mg | ORAL_TABLET | Freq: Two times a day (BID) | ORAL | 0 refills | Status: DC

## 2019-03-25 NOTE — Telephone Encounter (Signed)
Treating for BV and trich

## 2019-03-27 NOTE — Telephone Encounter (Signed)
Patient was evaluated for EPT and meets criteria listed below. Patient education about EPT was provided and patient Accepted: CRAFFT:  0 positive responses.  Positive responses generate discussion regarding alcohol use/abuse, safety, responsibility, 2 or more positive responses generate referral.  Patient was advised to contact clinic if questions or concerns arise and verbalized understanding of information by repeat back.  Prescription completed for EPT for trichomonas. Prescription faxed to pharmacy by nursing staff. Copy given to nursing team for records.   Inclusion criteria: . The index case has a diagnosis of chlamydia or trichomoniasis . The partner of the index case is unlikely to present for examination and treatment.  . Therapy is being prescribed as outlined in the treatment protocol.  . The partner has no contraindication to treatment. . If the partner has information available via electronic medical record (EMR), the provider will review the record for information to support the therapy plan. . Known sex partner or recent sex partner (last 60 days) . Names and demographic information are provided to comply with medication dispensing and pharmacy prescribing regulations requiring the name on a written prescription. NCGS 106-134.  Exclusion criteria: . Individuals requiring multi-dose treatment regimen. . Men who have sex with men (MSM) . Individuals with known allergies or other contraindications for the treatment.  Note: Adverse reaction to treatment is rare.

## 2019-04-05 ENCOUNTER — Other Ambulatory Visit (HOSPITAL_COMMUNITY)
Admission: RE | Admit: 2019-04-05 | Discharge: 2019-04-05 | Disposition: A | Discharge status: Home / Self Care | Payer: Medicaid Other | Source: Ambulatory Visit | Attending: Family Medicine | Admitting: Family Medicine

## 2019-04-05 ENCOUNTER — Ambulatory Visit (INDEPENDENT_AMBULATORY_CARE_PROVIDER_SITE_OTHER): Payer: Self-pay | Admitting: Family Medicine

## 2019-04-05 ENCOUNTER — Other Ambulatory Visit: Payer: Self-pay

## 2019-04-05 VITALS — BP 128/82 | HR 98

## 2019-04-05 DIAGNOSIS — N979 Female infertility, unspecified: Secondary | ICD-10-CM

## 2019-04-05 DIAGNOSIS — Z113 Encounter for screening for infections with a predominantly sexual mode of transmission: Secondary | ICD-10-CM | POA: Insufficient documentation

## 2019-04-05 DIAGNOSIS — N912 Amenorrhea, unspecified: Secondary | ICD-10-CM

## 2019-04-05 LAB — POCT WET PREP (WET MOUNT)
Clue Cells Wet Prep Whiff POC: NEGATIVE
Trichomonas Wet Prep HPF POC: ABSENT

## 2019-04-05 LAB — POCT URINE PREGNANCY: Preg Test, Ur: NEGATIVE

## 2019-04-05 NOTE — Progress Notes (Signed)
    SUBJECTIVE:   CHIEF COMPLAINT / HPI:   STD exposure Patient recently seen in the emergency department on 2/17 at which time she was diagnosed with trichomonas.  Patient completed treatment and partner received e.p.t. treatment as well.  Patient was concerned because she had intercourse with partner but it was the same day as he took the treatment. Unprotected intercourse at that time as well.  Denies vaginal discharge. Did not have vaginal discharge at that time either. Same sexual partner.  States partner has no discharge as well.  Denies any pelvic pain.  Is having regular periods.  Infertility Prior to leaving clinic patient asked for pregnancy test.  When I asked for further details she states she desires to get pregnant but has not been able to get pregnant so far.  Has not been on any birth control.  With previous partner she was having consistent unprotected intercourse.  Is not been having consistent unprotected intercourse for at least a year with her partner.  PERTINENT  PMH / PSH: HTN, T2DM, morbid obesity  OBJECTIVE:   BP 128/82   Pulse 98   SpO2 97%   Gen: awake and alert, NAD Female genitalia: exam declined by the patient  ASSESSMENT/PLAN:   Screen for STD (sexually transmitted disease) Patient pelvic exam today.  Advised importance of accurate swabbing to obtain accurate results.  Pelvic exam would also be important to determine cervical motion tenderness.  Patient refused but stated she would self swabbing to urinate.  I discussed with preceptors who stated this was allowed given patient's refusal or pelvic exam. Wet prep negative, discussed results with patient. I advised patient to have partners tested as well. Advised safe sex practices. GC/chlamydia via urine obtained. Hep C, HIV, RPR obtained.   Infertility, female Patient with desire for fertility.  Patient significant medical history including diabetes and hypertension on lisinopril.  I discussed this with  patient.  I advised that if patient desires fertility that she needs to first discuss with her PCP Dr. Frances Furbish.  I informed her that lisinopril was teratogenic and she voiced understanding.  Patient will also need to be on prenatal vitamin if desires to become pregnant. Patient to follow up in the next 1-2 weeks to discuss with Dr. Frances Furbish.      Oralia Manis, DO  PGY-3 [facility]

## 2019-04-05 NOTE — Patient Instructions (Signed)
Female Infertility  Female infertility refers to a woman's inability to get pregnant (conceive) after a year of having sex regularly (or after 6 months in women over age 33) without using birth control. Infertility can also mean that a woman is not able to carry a pregnancy to full term. Both women and men can have fertility problems. What are the causes? This condition may be caused by:  Problems with reproductive organs. Infertility can result if a woman: ? Has an abnormally short cervix or a cervix that does not remain closed during a pregnancy. ? Has a blockage or scarring in the fallopian tubes. ? Has an abnormally shaped uterus. ? Has uterine fibroids. This is a benign mass of tissue or muscle (tumor) that can develop in the uterus. ? Is not ovulating in a regular way.  Certain medical conditions. These may include: ? Polycystic ovary syndrome (PCOS). This is a hormonal disorder that can cause small cysts to grow on the ovaries. This is the most common cause of infertility in women. ? Endometriosis. This is a condition in which the tissue that lines the uterus (endometrium) grows outside of its normal location. ? Cancer and cancer treatments, such as chemotherapy or radiation. ? Premature ovarian failure. This is when ovaries stop producing eggs and hormones before age 40. ? Sexually transmitted diseases, such as chlamydia or gonorrhea. ? Autoimmune disorders. These are disorders in which the body's defense system (immune system) attacks normal, healthy cells. Infertility can be linked to more than one cause. For some women, the cause of infertility is not known (unexplained infertility). What increases the risk?  Age. A woman's fertility declines with age, especially after her mid-30s.  Being underweight or overweight.  Drinking too much alcohol.  Using drugs such as anabolic steroids, cocaine, and marijuana.  Exercising excessively.  Being exposed to environmental toxins,  such as radiation, pesticides, and certain chemicals. What are the signs or symptoms? The main sign of infertility in women is the inability to get pregnant or carry a pregnancy to full term. How is this diagnosed? This condition may be diagnosed by:  Checking whether you are ovulating each month. The tests may include: ? Blood tests to check hormone levels. ? An ultrasound of the ovaries. ? Taking a small tissue that lines the uterus and checking it under a microscope (endometrial biopsy).  Doing additional tests. This is done if ovulation is normal. Tests may include: ? Hysterosalpingography. This X-ray test can show the shape of the uterus and whether the fallopian tubes are open. ? Laparoscopy. This test uses a lighted tube (laparoscope) to look for problems in the fallopian tubes and other organs. ? Transvaginal ultrasound. This imaging test is used to check for abnormalities in the uterus and ovaries. ? Hysteroscopy. This test uses a lighted tube to check for problems in the cervix and the uterus. To be diagnosed with infertility, both partners will have a physical exam. Both partners will also have an extensive medical and sexual history taken. Additional tests may be done. How is this treated? Treatment depends on the cause of infertility. Most cases of infertility in women are treated with medicine or surgery.  Women may take medicine to: ? Correct ovulation problems. ? Treat other health conditions.  Surgery may be done to: ? Repair damage to the ovaries, fallopian tubes, cervix, or uterus. ? Remove growths from the uterus. ? Remove scar tissue from the uterus, pelvis, or other organs. Assisted reproductive technology (ART) Assisted reproductive technology (  ART) refers to all treatments and procedures that combine eggs and sperm outside the body to try to help a couple conceive. ART is often combined with fertility drugs to stimulate ovulation. Sometimes ART is done using eggs  retrieved from another woman's body (donor eggs) or from previously frozen fertilized eggs (embryos). There are different types of ART. These include:  Intrauterine insemination (IUI). A long, thin tube is used to place sperm directly into a woman's uterus. This procedure: ? Is effective for infertility caused by sperm problems, including low sperm count and low motility. ? Can be used in combination with fertility drugs.  In vitro fertilization (IVF). This is done when a woman's fallopian tubes are blocked or when a man has low sperm count. In this procedure: ? Fertility drugs are used to stimulate the ovaries to produce multiple eggs. ? Once mature, these eggs are removed from the body and combined with the sperm to be fertilized. ? The fertilized eggs are then placed into the woman's uterus. Follow these instructions at home:  Take over-the-counter and prescription medicines only as told by your health care provider.  Do not use any products that contain nicotine or tobacco, such as cigarettes and e-cigarettes. If you need help quitting, ask your health care provider.  If you drink alcohol, limit how much you have to 1 drink a day.  Make dietary changes to lose weight or maintain a healthy weight. Work with your health care provider and a dietitian to set a weight-loss goal that is healthy and reasonable for you.  Seek support from a counselor or support group to talk about your concerns related to infertility. Couples counseling may be helpful for you and your partner.  Practice stress reduction techniques that work well for you, such as regular physical activity, meditation, or deep breathing.  Keep all follow-up visits as told by your health care provider. This is important. Contact a health care provider if you:  Feel that stress is interfering with your life and relationships.  Have side effects from treatments for infertility. Summary  Female infertility refers to a woman's  inability to get pregnant (conceive) after a year of having sex regularly (or after 6 months in women over age 33) without using birth control.  To be diagnosed with infertility, both partners will have a physical exam. Both partners will also have an extensive medical and sexual history taken.  Seek support from a counselor or support group to talk about your concerns related to infertility. Couples counseling may be helpful for you and your partner. This information is not intended to replace advice given to you by your health care provider. Make sure you discuss any questions you have with your health care provider. Document Revised: 05/12/2018 Document Reviewed: 12/21/2016 Elsevier Patient Education  2020 [employer] Inc.  

## 2019-04-06 ENCOUNTER — Encounter: Payer: Self-pay | Admitting: Family Medicine

## 2019-04-06 LAB — URINE CYTOLOGY ANCILLARY ONLY
Chlamydia: NEGATIVE
Comment: NEGATIVE
Comment: NORMAL
Neisseria Gonorrhea: NEGATIVE

## 2019-04-06 LAB — HIV ANTIBODY (ROUTINE TESTING W REFLEX): HIV Screen 4th Generation wRfx: NONREACTIVE

## 2019-04-06 LAB — HEPATITIS C ANTIBODY: Hep C Virus Ab: <0.1 s/co ratio (ref 0.0–0.9)

## 2019-04-06 LAB — RPR: RPR Ser Ql: NONREACTIVE

## 2019-04-06 NOTE — Assessment & Plan Note (Signed)
Patient with desire for fertility.  Patient significant medical history including diabetes and hypertension on lisinopril.  I discussed this with patient.  I advised that if patient desires fertility that she needs to first discuss with her PCP Dr. Frances Furbish.  I informed her that lisinopril was teratogenic and she voiced understanding.  Patient will also need to be on prenatal vitamin if desires to become pregnant. Patient to follow up in the next 1-2 weeks to discuss with Dr. Frances Furbish.

## 2019-04-06 NOTE — Assessment & Plan Note (Signed)
Patient pelvic exam today.  Advised importance of accurate swabbing to obtain accurate results.  Pelvic exam would also be important to determine cervical motion tenderness.  Patient refused but stated she would self swabbing to urinate.  I discussed with preceptors who stated this was allowed given patient's refusal or pelvic exam. Wet prep negative, discussed results with patient. I advised patient to have partners tested as well. Advised safe sex practices. GC/chlamydia via urine obtained. Hep C, HIV, RPR obtained.

## 2019-04-29 ENCOUNTER — Encounter: Payer: Self-pay | Admitting: Family Medicine

## 2019-05-04 ENCOUNTER — Ambulatory Visit: Payer: Medicaid Other

## 2019-06-08 ENCOUNTER — Other Ambulatory Visit: Payer: Self-pay

## 2019-06-08 ENCOUNTER — Encounter (HOSPITAL_COMMUNITY): Payer: Self-pay

## 2019-06-08 ENCOUNTER — Ambulatory Visit (INDEPENDENT_AMBULATORY_CARE_PROVIDER_SITE_OTHER): Payer: Self-pay

## 2019-06-08 ENCOUNTER — Ambulatory Visit (HOSPITAL_COMMUNITY)
Admission: EM | Admit: 2019-06-08 | Discharge: 2019-06-08 | Disposition: A | Discharge status: Home / Self Care | Payer: Self-pay | Attending: Family Medicine | Admitting: Family Medicine

## 2019-06-08 DIAGNOSIS — M25562 Pain in left knee: Secondary | ICD-10-CM

## 2019-06-08 DIAGNOSIS — M25561 Pain in right knee: Secondary | ICD-10-CM

## 2019-06-08 DIAGNOSIS — M79651 Pain in right thigh: Secondary | ICD-10-CM

## 2019-06-08 DIAGNOSIS — G8929 Other chronic pain: Secondary | ICD-10-CM

## 2019-06-08 DIAGNOSIS — M79652 Pain in left thigh: Secondary | ICD-10-CM

## 2019-06-08 LAB — POC URINE PREG, ED: Preg Test, Ur: NEGATIVE

## 2019-06-08 MED ORDER — METHOCARBAMOL 750 MG PO TABS: 750.0000 mg | ORAL_TABLET | Freq: Three times a day (TID) | ORAL | 1 refills | Status: DC | PRN

## 2019-06-08 MED ORDER — GABAPENTIN 300 MG PO CAPS
300.0000 mg | ORAL_CAPSULE | Freq: Three times a day (TID) | ORAL | 3 refills | Status: DC
Start: 2019-06-08 — End: 2019-09-19

## 2019-06-08 MED ORDER — MELOXICAM 15 MG PO TABS
15.0000 mg | ORAL_TABLET | Freq: Every day | ORAL | 1 refills | Status: DC
Start: 2019-06-08 — End: 2019-08-04

## 2019-06-08 NOTE — Discharge Instructions (Addendum)
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  I would recommend evaluation by orthopedic.  Contact your primary care office to get information regarding [facility] financial assistance.  I have refilled your gabapentin and Robaxin.  I am adding meloxicam 15 mg once daily to take as needed to decrease the inflammation present in her knees.

## 2019-06-08 NOTE — ED Provider Notes (Signed)
[facility]    CSN: [id number] Arrival date & time: 06/08/19  0946      History   Chief Complaint Chief Complaint  Patient presents with  . Leg Pain    HPI Lisa Cooley is a 34 y.o. female.   HPI  Patient with a history of persistent bilateral upper thigh and knee pain ongoing for several years. 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 These activities exacerbate pain. She has had imaging of her knees in 2018, which reveal mild degenerative changes involving the right knee with medial joint space narrowing/spurring. She has not had any imaging follow-up since that time. She is currently taking Robaxin which is not improving the pain. In past she has been prescribed Robaxin with Gabapentin with moderate relief.  She characterizes the pain as sharp, stabbing, to burning and occasionally aching.  She feels the pain originates in her knees and radiates up anterior medial region of her thighs.  She is without insurance therefore has been unable to follow-up with an orthopedic specialist and unable to be referred for any physical therapy. Past Medical History:  Diagnosis Date  . Diabetes mellitus    Dx in [place] but not on meds  . Headache(784.0)   . Hypertension     Patient Active Problem List   Diagnosis Date Noted  . Hypertension associated with diabetes ([place]) 08/18/2018  . Healthcare maintenance 08/18/2018  . Screening for malignant neoplasm of cervix 12/20/2017  . Positive depression screening 12/20/2017  . Unprotected sex 09/17/2017  . 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 Screen for STD (sexually transmitted disease) 09/17/2017  . Diabetes mellitus type 2 in obese ([place]) 12/31/2016  . Epigastric pain 08/13/2011  . DM2 (diabetes mellitus, type 2) ([place]) 08/12/2011  . Morbid obesity ([place]) 08/12/2011  . Infertility, female 08/12/2011  . Irregular periods 08/12/2011    Past Surgical History:  Procedure  Laterality Date  . NO PAST SURGERIES      OB History    Gravida  0   Para  0   Term  0   Preterm      AB  0   Living  0     SAB  0   TAB      Ectopic      Multiple      Live Births               Home Medications    Prior to Admission medications   Medication Sig Start Date End Date Taking? Authorizing Provider  albuterol (PROVENTIL HFA;VENTOLIN HFA) 108 (90 Base) MCG/ACT inhaler Inhale 2 puffs into the lungs every 6 (six) hours as needed for wheezing or shortness of breath. 12/29/16   Alfonse Spruce, FNP  diclofenac (VOLTAREN) 75 MG EC tablet Take 1 tablet (75 mg total) by mouth 2 (two) times daily. 03/22/19   Vanessa Kick, MD  DULoxetine (CYMBALTA) 60 MG capsule Take 1 capsule (60 mg total) by mouth daily. 12/20/17   Benay Pike, MD  fluconazole (DIFLUCAN) 150 MG tablet Take one tablet by mouth as a single dose. May repeat in 3 days if symptoms persist. 03/22/19   Vanessa Kick, MD  gabapentin (NEURONTIN) 300 MG capsule Take 1 capsule (300 mg total) by mouth 3 (three) times daily as needed. 12/20/17   Benay Pike, MD  glipiZIDE (GLUCOTROL) 5 MG tablet Take 1 tablet (5 mg total) by mouth daily before breakfast. 12/20/17  Sandre Kitty, MD  lisinopril (ZESTRIL) 10 MG tablet Take 1 tablet (10 mg total) by mouth at bedtime. 08/15/18   Myrene Buddy, MD  metFORMIN (GLUCOPHAGE) 1000 MG tablet Take 1 tablet (1,000 mg total) by mouth 2 (two) times daily with a meal. 08/15/18   Myrene Buddy, MD  methocarbamol (ROBAXIN) 750 MG tablet Take 1 tablet (750 mg total) by mouth every 8 (eight) hours as needed for muscle spasms. 03/08/19   Lennox Solders, MD  metroNIDAZOLE (FLAGYL) 500 MG tablet Take 1 tablet (500 mg total) by mouth 2 (two) times daily. 03/25/19   Dahlia Byes A, NP  naproxen (NAPROSYN) 500 MG tablet Take 1 tablet (500 mg total) by mouth 2 (two) times daily with a meal. Prn pain 08/15/18   Myrene Buddy, MD    Family History Family History    Problem Relation Age of Onset  . Cancer Father   . Heart disease Sister   . Other Neg Hx     Social History Social History   Tobacco Use  . Smoking status: Former Smoker    Packs/day: 0.25    Types: Cigarettes    Quit date: 04/20/2014    Years since quitting: 5.1  . Smokeless tobacco: Never Used  Substance Use Topics  . Alcohol use: No    Alcohol/week: 0.0 standard drinks  . Drug use: Yes    Types: Marijuana    Comment: last use 2 days ago     Allergies   Patient has no known allergies.  Review of Systems Review of Systems Pertinent negatives listed in HPI  Physical Exam Triage Vital Signs ED Triage Vitals  Enc Vitals Group     BP 06/08/19 1009 (!) 158/105     Pulse Rate 06/08/19 1009 85     Resp 06/08/19 1009 18     Temp 06/08/19 1009 98.7 F (37.1 C)     Temp Source 06/08/19 1009 Oral     SpO2 06/08/19 1009 100 %     Weight 06/08/19 1010 (!) 325 lb (147.4 kg)     Height 06/08/19 1010 5\' 7"  (1.702 m)     Head Circumference --      Peak Flow --      Pain Score 06/08/19 1010 9     Pain Loc --      Pain Edu? --      Excl. in GC? --    No data found.  Updated Vital Signs BP (!) 158/105   Pulse 85   Temp 98.7 F (37.1 C) (Oral)   Resp 18   Ht 5\' 7"  (1.702 m)   Wt (!) 325 lb (147.4 kg)   SpO2 100%   BMI 50.90 kg/m   Visual Acuity Right Eye Distance:   Left Eye Distance:   Bilateral Distance:    Right Eye Near:   Left Eye Near:    Bilateral Near:     Physical Exam Constitutional:      Appearance: She is obese.  Cardiovascular:     Rate and Rhythm: Normal rate and regular rhythm.  Pulmonary:     Effort: Pulmonary effort is normal.     Breath sounds: Normal breath sounds.  Musculoskeletal:     Right upper leg: Swelling present.     Left upper leg: Swelling present.     Right knee: Bony tenderness and crepitus present. Tenderness present over the medial joint line and patellar tendon.     Left knee: Bony tenderness and crepitus  present.  Tenderness present over the medial joint line and patellar tendon.  Skin:    General: Skin is warm and dry.  Neurological:     Mental Status: She is oriented to person, place, and time.  Psychiatric:        Attention and Perception: Attention normal.        Mood and Affect: Mood normal.     UC Treatments / Results  Labs (all labs ordered are listed, but only abnormal results are displayed) Labs Reviewed - No data to display  EKG   Radiology DG Knee Complete 4 Views Left  Result Date: 06/08/2019 CLINICAL DATA:  Chronic knee pain. EXAM: LEFT KNEE - COMPLETE 4+ VIEW COMPARISON:  04/03/2016 FINDINGS: No fracture or bone lesion. Knee joint normally spaced and aligned. Small marginal osteophytes from the mediolateral compartments. No joint effusion. Surrounding soft tissues are unremarkable. IMPRESSION: 1. No fracture or acute finding. 2. Mild osteoarthritis.  No change from the prior exam. Electronically Signed   By: Amie Portland M.D.   On: 06/08/2019 11:41   DG Knee Complete 4 Views Right  Result Date: 06/08/2019 CLINICAL DATA:  Chronic knee pain. EXAM: RIGHT KNEE - COMPLETE 4+ VIEW COMPARISON:  04/03/2016 FINDINGS: No fracture or bone lesion. Mild marginal spurring from the medial compartment and patella. No joint effusion. Surrounding soft tissues are unremarkable. IMPRESSION: 1. Mild osteoarthritis. No fracture or acute finding. No significant change from the prior study. Electronically Signed   By: Amie Portland M.D.   On: 06/08/2019 11:42    Procedures Procedures (including critical care time)  Medications Ordered in UC Medications - No data to display  Initial Impression / Assessment and Plan / UC Course  I have reviewed the triage vital signs and the nursing notes.  Pertinent labs & imaging results that were available during my care of the patient were reviewed by me and considered in my medical decision making (see chart for details).    Bilateral chronic knee  pain Bilateral thigh pain Imaging confirms bilateral degenerative disease involving both knees  -Continue Methocarbamol and resume gabapentin -Start Meloxicam 15 mg once daily. -Recommend follow-up with orthopedics if symptoms worsen or do improve.  Final Clinical Impressions(s) / UC Diagnoses   Final diagnoses:  Bilateral chronic knee pain  Bilateral thigh pain     Discharge Instructions     Your x-ray today confirmed arthritis present in both knees which is likely the source of your pain.  I would recommend evaluation by orthopedic.  Contact your primary care office to get information regarding [facility] financial assistance.  I have refilled your gabapentin and Robaxin.  I am adding meloxicam 15 mg once daily to take as needed to decrease the inflammation present in her knees.    ED Prescriptions    Medication Sig Dispense Auth. Provider   methocarbamol (ROBAXIN) 750 MG tablet Take 1 tablet (750 mg total) by mouth every 8 (eight) hours as needed for muscle spasms. 30 tablet Bing Neighbors, FNP   gabapentin (NEURONTIN) 300 MG capsule Take 1 capsule (300 mg total) by mouth 3 (three) times daily. 90 capsule Bing Neighbors, FNP   meloxicam (MOBIC) 15 MG tablet Take 1 tablet (15 mg total) by mouth daily. 30 tablet Bing Neighbors, FNP     PDMP not reviewed this encounter.   Lydian, Chavous, FNP 06/08/19 [phone]
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
## 2019-06-21 ENCOUNTER — Other Ambulatory Visit: Payer: Self-pay

## 2019-06-22 ENCOUNTER — Ambulatory Visit: Payer: Medicaid Other | Admitting: Family Medicine

## 2019-07-11 ENCOUNTER — Telehealth: Payer: Self-pay | Admitting: Obstetrics and Gynecology

## 2019-07-11 ENCOUNTER — Other Ambulatory Visit: Payer: Self-pay

## 2019-07-11 ENCOUNTER — Encounter (HOSPITAL_COMMUNITY): Payer: Self-pay | Admitting: Obstetrics and Gynecology

## 2019-07-11 ENCOUNTER — Inpatient Hospital Stay (HOSPITAL_COMMUNITY)
Admission: AD | Admit: 2019-07-11 | Discharge: 2019-07-11 | Disposition: A | Discharge status: Home / Self Care | Payer: Medicaid Other | Attending: Obstetrics and Gynecology | Admitting: Obstetrics and Gynecology

## 2019-07-11 DIAGNOSIS — Z79899 Other long term (current) drug therapy: Secondary | ICD-10-CM | POA: Insufficient documentation

## 2019-07-11 DIAGNOSIS — I10 Essential (primary) hypertension: Secondary | ICD-10-CM | POA: Insufficient documentation

## 2019-07-11 DIAGNOSIS — Z3202 Encounter for pregnancy test, result negative: Secondary | ICD-10-CM

## 2019-07-11 DIAGNOSIS — N939 Abnormal uterine and vaginal bleeding, unspecified: Secondary | ICD-10-CM | POA: Insufficient documentation

## 2019-07-11 DIAGNOSIS — R109 Unspecified abdominal pain: Secondary | ICD-10-CM

## 2019-07-11 DIAGNOSIS — Z87891 Personal history of nicotine dependence: Secondary | ICD-10-CM | POA: Insufficient documentation

## 2019-07-11 DIAGNOSIS — Z791 Long term (current) use of non-steroidal anti-inflammatories (NSAID): Secondary | ICD-10-CM | POA: Insufficient documentation

## 2019-07-11 DIAGNOSIS — Z7984 Long term (current) use of oral hypoglycemic drugs: Secondary | ICD-10-CM | POA: Insufficient documentation

## 2019-07-11 DIAGNOSIS — E119 Type 2 diabetes mellitus without complications: Secondary | ICD-10-CM | POA: Insufficient documentation

## 2019-07-11 LAB — HCG, QUANTITATIVE, PREGNANCY: hCG, Beta Chain, Quant, S: <1 m[IU]/mL (ref <5)

## 2019-07-11 LAB — POCT PREGNANCY, URINE: Preg Test, Ur: NEGATIVE

## 2019-07-11 NOTE — Telephone Encounter (Signed)
Notified of Negative HGC levels and that VB and cramping is more than likely coming from late onset of menses since her LMP 06/08/2019. Advised to F/U with GYN provider. Educated on the appropriate time to read HPT tests. Patient verbalized an understanding of the plan of care and agrees.   Raelyn Mora, CNM

## 2019-07-11 NOTE — Telephone Encounter (Signed)
LVM to call MAU provider office for results.  Raelyn Mora, CNM

## 2019-07-11 NOTE — MAU Provider Note (Signed)
Chief Complaint: Vaginal Bleeding, Abdominal Pain, and Possible Pregnancy   First Provider Initiated Contact with Patient 07/11/19 1156      SUBJECTIVE HPI: [name] is a 34 y.o. [id number] who presents to MAU for (+) HPT 2 wks ago. She started having VB and cramping this morning when she woke up this morning.    Past Medical History:  Diagnosis Date   Diabetes mellitus    Dx in [place] but not on meds   Headache(784.0)    Hypertension    Past Surgical History:  Procedure Laterality Date   NO PAST SURGERIES     Social History   Socioeconomic History   Marital status: Single    Spouse name: Not on file   Number of children: Not on file   Years of education: Not on file   Highest education level: Not on file  Occupational History   Not on file  Tobacco Use   Smoking status: Former Smoker    Packs/day: 0.25    Types: Cigarettes    Quit date: 04/20/2014    Years since quitting: 5.2   Smokeless tobacco: Never Used  Substance and Sexual Activity   Alcohol use: No    Alcohol/week: 0.0 standard drinks   Drug use: Yes    Types: Marijuana    Comment: last use 2 days ago   Sexual activity: Yes    Birth control/protection: None  Other Topics Concern   Not on file  Social History Narrative   Not on file   Social Determinants of Health   Financial Resource Strain:    Difficulty of Paying Living Expenses:   Food Insecurity:    Worried About Programme researcher, broadcasting/film/video in the Last Year:    Barista in the Last Year:   Transportation Needs:    Freight forwarder (Medical):    Lack of Transportation (Non-Medical):   Physical Activity:    Days of Exercise per Week:    Minutes of Exercise per Session:   Stress:    Feeling of Stress :   Social Connections:    Frequency of Communication with Friends and Family:    Frequency of Social Gatherings with Friends and Family:    Attends Religious Services:    Active Member of Clubs or  Organizations:    Attends Engineer, structural:    Marital Status:   Intimate Partner Violence:    Fear of Current or Ex-Partner:    Emotionally Abused:    Physically Abused:    Sexually Abused:    No current facility-administered medications on file prior to encounter.   Current Outpatient Medications on File Prior to Encounter  Medication Sig Dispense Refill   albuterol (PROVENTIL HFA;VENTOLIN HFA) 108 (90 Base) MCG/ACT inhaler Inhale 2 puffs into the lungs every 6 (six) hours as needed for wheezing or shortness of breath. 1 Inhaler 2   diclofenac (VOLTAREN) 75 MG EC tablet Take 1 tablet (75 mg total) by mouth 2 (two) times daily. 14 tablet 0   DULoxetine (CYMBALTA) 60 MG capsule Take 1 capsule (60 mg total) by mouth daily. 30 capsule 1   gabapentin (NEURONTIN) 300 MG capsule Take 1 capsule (300 mg total) by mouth 3 (three) times daily. 90 capsule 3   glipiZIDE (GLUCOTROL) 5 MG tablet Take 1 tablet (5 mg total) by mouth daily before breakfast. 30 tablet 2   lisinopril (ZESTRIL) 10 MG tablet Take 1 tablet (10 mg total) by mouth at bedtime.  30 tablet 0   meloxicam (MOBIC) 15 MG tablet Take 1 tablet (15 mg total) by mouth daily. 30 tablet 1   metFORMIN (GLUCOPHAGE) 1000 MG tablet Take 1 tablet (1,000 mg total) by mouth 2 (two) times daily with a meal. 180 tablet 0   methocarbamol (ROBAXIN) 750 MG tablet Take 1 tablet (750 mg total) by mouth every 8 (eight) hours as needed for muscle spasms. 30 tablet 1   naproxen (NAPROSYN) 500 MG tablet Take 1 tablet (500 mg total) by mouth 2 (two) times daily with a meal. Prn pain 60 tablet 0   No Known Allergies  ROS:  Review of Systems  Genitourinary: Positive for pelvic pain (cramping) and vaginal bleeding.    I have reviewed patient's Past Medical Hx, Surgical Hx, Family Hx, Social Hx, medications and allergies.   Physical Exam   Patient Vitals for the past 24 hrs:  BP Temp Temp src Pulse Resp SpO2 Height Weight   07/11/19 1050 (!) 149/81 99.2 F (37.3 C) Oral 87 18 99 % 5\' 7"  (1.702 m) (!) 166.3 kg   Physical Exam  Nursing note and vitals reviewed. Constitutional: She is oriented to person, place, and time. She appears well-developed and well-nourished.  HENT:  Head: Normocephalic and atraumatic.  Eyes: Pupils are equal, round, and reactive to light.  Cardiovascular: Normal rate and regular rhythm.  Respiratory: Effort normal.  GI: Soft.  Genitourinary:    Genitourinary Comments: deferred   Musculoskeletal:        General: Normal range of motion.     Cervical back: Normal range of motion.  Neurological: She is alert and oriented to person, place, and time.  Skin: Skin is warm and dry.  Psychiatric: She has a normal mood and affect. Her behavior is normal. Judgment and thought content normal.    MDM Patient denies any concerning symptoms in need of emergent evaluation. Patient advised that she may wait for a room in MAU or may choose to be discharged to seek non-emergent evaluation of her complaint at [facility], Urgent care or her PCP.   Recent Results (from the past 2160 hour(s))  POC urine pregnancy     Status: None   Collection Time: 06/08/19 11:17 AM  Result Value Ref Range   Preg Test, Ur NEGATIVE NEGATIVE    Comment:        THE SENSITIVITY OF THIS METHODOLOGY IS >24 mIU/mL   Pregnancy, urine POC     Status: None   Collection Time: 07/11/19 11:07 AM  Result Value Ref Range   Preg Test, Ur NEGATIVE NEGATIVE    Comment:        THE SENSITIVITY OF THIS METHODOLOGY IS >24 mIU/mL   hCG, quantitative, pregnancy     Status: None   Collection Time: 07/11/19 11:22 AM  Result Value Ref Range   hCG, Beta Chain, Quant, S <1 <5 mIU/mL    Comment:          GEST. AGE      CONC.  (mIU/mL)   <=1 WEEK        5 - 50     2 WEEKS       50 - 500     3 WEEKS       100 - 10,000     4 WEEKS     1,000 - 30,000     5 WEEKS     3,500 - 115,000   6-8 WEEKS     12,000 - 270,000  12 WEEKS      15,000 - 220,000        FEMALE AND NON-PREGNANT FEMALE:     LESS THAN 5 mIU/mL Performed at [facility], [place] [address] [place], [place] [address]     ASSESSMENT MSE Complete  PLAN Discharge patient to seek non-emergent medical care at emergency room, urgent care and GYN provider. Advised MAU provider will call with results.  Laury Deep, CNM 07/11/2019 12:01 PM

## 2019-07-11 NOTE — MAU Note (Signed)
+  HPT 2 wks ago.  Has appt with dr on the 14th.  Got up this morning and was bleeding. Mild cramping.

## 2019-07-17 ENCOUNTER — Ambulatory Visit: Payer: Medicaid Other | Admitting: Family Medicine

## 2019-08-04 ENCOUNTER — Ambulatory Visit (HOSPITAL_COMMUNITY)
Admission: EM | Admit: 2019-08-04 | Discharge: 2019-08-04 | Disposition: A | Discharge status: Home / Self Care | Payer: Medicaid Other | Attending: Family Medicine | Admitting: Family Medicine

## 2019-08-04 ENCOUNTER — Other Ambulatory Visit: Payer: Self-pay

## 2019-08-04 ENCOUNTER — Encounter (HOSPITAL_COMMUNITY): Payer: Self-pay

## 2019-08-04 DIAGNOSIS — S39012A Strain of muscle, fascia and tendon of lower back, initial encounter: Secondary | ICD-10-CM

## 2019-08-04 MED ORDER — HYDROCODONE-ACETAMINOPHEN 5-325 MG PO TABS: 1.0000 | ORAL_TABLET | Freq: Four times a day (QID) | ORAL | 0 refills | Status: DC | PRN

## 2019-08-04 MED ORDER — NAPROXEN 500 MG PO TABS
500.0000 mg | ORAL_TABLET | Freq: Two times a day (BID) | ORAL | 0 refills | Status: DC
Start: 2019-08-04 — End: 2020-02-15

## 2019-08-04 MED ORDER — TIZANIDINE HCL 4 MG PO TABS: 4.0000 mg | ORAL_TABLET | Freq: Four times a day (QID) | ORAL | 0 refills | Status: DC | PRN

## 2019-08-04 NOTE — ED Triage Notes (Signed)
Pt presents to UC for back pain x4 days. Pt denies any urinary frequency, burning with urination. Pt denies flank pain. 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 Pt also c/o right shoulder pain.  Pt has taken tylenol and motrin with out relief. Pt denies other relieving factors. Pt noted to have full range of motion of right shoulder.

## 2019-08-04 NOTE — Discharge Instructions (Addendum)
Take Naprosyn 2 times a day with food Take tizanidine as needed muscle relaxer.  This is useful at bedtime Take hydrocodone if needed for severe pain, do not drive on hydrocodone Decrease your activity level.  Use ice or heat to painful back muscles Stretch out those painful muscles twice a day See your primary care doctor if you fail to improve

## 2019-08-04 NOTE — ED Provider Notes (Signed)
[facility]    CSN: [id number] Arrival date & time: 08/04/19  1451      History   Chief Complaint Chief Complaint  Patient presents with  . Back Pain    HPI Lisa Cooley is a 34 y.o. female.   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  It comes and goes.  Worse with movement.  Better with rest.  No numbness or weakness.  No history of back condition, or injury. She tried over-the-counter medications and that did not help She is requesting some time off of work  Past Medical History:  Diagnosis Date  . Diabetes mellitus    Dx in [place] but not on meds  . Headache(784.0)   . Hypertension     Patient Active Problem List   Diagnosis Date Noted  . Hypertension associated with diabetes ([facility]) 08/18/2018  . Healthcare maintenance 08/18/2018  . Screening for malignant neoplasm of cervix 12/20/2017  . Positive depression screening 12/20/2017  . Unprotected sex 09/17/2017  . 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 Screen for STD (sexually transmitted disease) 09/17/2017  . Diabetes mellitus type 2 in obese ([facility]) 12/31/2016  . Epigastric pain 08/13/2011  . DM2 (diabetes mellitus, type 2) ([facility]) 08/12/2011  . Morbid obesity ([facility]) 08/12/2011  . Infertility, female 08/12/2011  . Irregular periods 08/12/2011    Past Surgical History:  Procedure Laterality Date  . NO PAST SURGERIES      OB History    Gravida  0   Para  0   Term  0   Preterm      AB  0   Living  0     SAB  0   TAB      Ectopic      Multiple      Live Births               Home Medications    Prior to Admission medications   Medication Sig Start Date End Date Taking? Authorizing Provider  albuterol (PROVENTIL HFA;VENTOLIN  HFA) 108 (90 Base) MCG/ACT inhaler Inhale 2 puffs into the lungs every 6 (six) hours as needed for wheezing or shortness of breath. 12/29/16   Lizbeth Bark, FNP  DULoxetine (CYMBALTA) 60 MG capsule Take 1 capsule (60 mg total) by mouth daily. 12/20/17   Sandre Kitty, MD  gabapentin (NEURONTIN) 300 MG capsule Take 1 capsule (300 mg total) by mouth 3 (three) times daily. 06/08/19   Bing Neighbors, FNP  glipiZIDE (GLUCOTROL) 5 MG tablet Take 1 tablet (5 mg total) by mouth daily before breakfast. 12/20/17   Sandre Kitty, MD  HYDROcodone-acetaminophen (NORCO/VICODIN) 5-325 MG tablet Take 1-2 tablets by mouth every 6 (six) hours as needed. 08/04/19   Eustace Moore, MD  lisinopril (ZESTRIL) 10 MG tablet Take 1 tablet (10 mg total) by mouth at bedtime. 08/15/18   Myrene Buddy, MD  metFORMIN (GLUCOPHAGE) 1000 MG tablet Take 1 tablet (1,000 mg total) by mouth 2 (two) times daily with a meal. 08/15/18   Myrene Buddy, MD  naproxen (NAPROSYN) 500 MG tablet Take 1 tablet (500 mg total) by mouth 2 (two) times daily. 08/04/19   Rica Mast  Fannie Knee, MD  tiZANidine (ZANAFLEX) 4 MG tablet Take 1-2 tablets (4-8 mg total) by mouth every 6 (six) hours as needed for muscle spasms. 08/04/19   Eustace Moore, MD    Family History Family History  Problem Relation Age of Onset  . Cancer Father   . Heart disease Sister   . Other Neg Hx     Social History Social History   Tobacco Use  . Smoking status: Former Smoker    Packs/day: 0.25    Types: Cigarettes    Quit date: 04/20/2014    Years since quitting: 5.2  . Smokeless tobacco: Never Used  Substance Use Topics  . Alcohol use: No    Alcohol/week: 0.0 standard drinks  . Drug use: Yes    Types: Marijuana    Comment: last use 2 days ago     Allergies   Patient has no known allergies.   Review of Systems Review of Systems See HPI  Physical Exam Triage Vital Signs ED Triage Vitals  Enc Vitals Group     BP 08/04/19 1509 (!)  155/89     Pulse Rate 08/04/19 1509 81     Resp 08/04/19 1509 16     Temp 08/04/19 1509 98 F (36.7 C)     Temp src --      SpO2 08/04/19 1509 100 %     Weight --      Height --      Head Circumference --      Peak Flow --      Pain Score 08/04/19 1507 8     Pain Loc --      Pain Edu? --      Excl. in GC? --    No data found.  Updated Vital Signs BP (!) 155/89 (BP Location: Right Arm)   Pulse 81   Temp 98 F (36.7 C)   Resp 16   LMP 07/11/2019 (Exact Date)   SpO2 100%      Physical Exam Constitutional:      General: She is not in acute distress.    Appearance: She is well-developed. She is obese.  HENT:     Head: Normocephalic and atraumatic.     Mouth/Throat:     Comments: Mask in place Eyes:     Conjunctiva/sclera: Conjunctivae normal.     Pupils: Pupils are equal, round, and reactive to light.  Cardiovascular:     Rate and Rhythm: Normal rate.  Pulmonary:     Effort: Pulmonary effort is normal. No respiratory distress.  Abdominal:     General: There is no distension.     Palpations: Abdomen is soft.  Musculoskeletal:        General: Normal range of motion.     Cervical back: Normal range of motion.     Comments: Lumbar spine is straight and symmetric.  Very limited range of motion.  Tenderness to palpation in the lumbar muscles that is moderate.  Strength, sensation, range of motion, and reflexes are normal in both lower extremities. Straight leg raise is negative bilateral.   Skin:    General: Skin is warm and dry.  Neurological:     Mental Status: She is alert.  Psychiatric:        Mood and Affect: Mood normal.        Behavior: Behavior normal.    Obese No focal findings  UC Treatments / Results  Labs (all labs ordered are listed, but only abnormal results are displayed)  Labs Reviewed - No data to display  EKG   Radiology No results found.  Procedures Procedures (including critical care time)  Medications Ordered in UC Medications -  No data to display  Initial Impression / Assessment and Plan / UC Course  I have reviewed the triage vital signs and the nursing notes.  Pertinent labs & imaging results that were available during my care of the patient were reviewed by me and considered in my medical decision making (see chart for details).     Mechanical back pain.  Conservative management discussed. Final Clinical Impressions(s) / UC Diagnoses   Final diagnoses:  Strain of lumbar region, initial encounter     Discharge Instructions     Take Naprosyn 2 times a day with food Take tizanidine as needed muscle relaxer.  This is useful at bedtime Take hydrocodone if needed for severe pain, do not drive on hydrocodone Decrease your activity level.  Use ice or heat to painful back muscles Stretch out those painful muscles twice a day See your primary care doctor if you fail to improve    ED Prescriptions    Medication Sig Dispense Auth. Provider   naproxen (NAPROSYN) 500 MG tablet Take 1 tablet (500 mg total) by mouth 2 (two) times daily. 30 tablet Eustace Moore, MD   tiZANidine (ZANAFLEX) 4 MG tablet Take 1-2 tablets (4-8 mg total) by mouth every 6 (six) hours as needed for muscle spasms. 21 tablet Eustace Moore, MD   HYDROcodone-acetaminophen (NORCO/VICODIN) 5-325 MG tablet Take 1-2 tablets by mouth every 6 (six) hours as needed. 10 tablet Eustace Moore, MD     I have reviewed the PDMP during this encounter.   Eustace Moore, MD 08/06/19 1012

## 2019-08-28 ENCOUNTER — Other Ambulatory Visit: Payer: Self-pay

## 2019-08-28 ENCOUNTER — Ambulatory Visit (INDEPENDENT_AMBULATORY_CARE_PROVIDER_SITE_OTHER): Payer: Self-pay

## 2019-08-28 DIAGNOSIS — Z111 Encounter for screening for respiratory tuberculosis: Secondary | ICD-10-CM

## 2019-08-28 MED ORDER — LISINOPRIL 10 MG PO TABS: 10.0000 mg | ORAL_TABLET | Freq: Every day | ORAL | 0 refills | Status: DC

## 2019-08-28 NOTE — Progress Notes (Signed)
Patient is here for a PPD placement.  It was placed on 08/28/2019 in the left forearm @ 1515.     Patient is scheduled for PPD read and office visit with PCP on 7/28 at 1600. Reminder card given.   Veronda Prude, RN

## 2019-08-29 NOTE — Progress Notes (Signed)
    SUBJECTIVE:   CHIEF COMPLAINT / HPI: medication refill and PPD read  HTN Patient reports not having medication for 2 months as she is having financial difficulties .  She has started to feel unwell and is having some intermittent dizziness.  Also reports some chest pain but reproducible.  Denies any SOB, visual changes or lower extremity edema. Does not check BP at home. 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  DM Type 2 Reports has not has medications in 2 days.  She is experiencing some dizziness.  Does not check sugars at home as she needs a new glucose meter.     PERTINENT  PMH / PSH:  HTN DM Type 2 Obesity   OBJECTIVE:   Ht _0  (1.702 m)   Wt (!) 351 lb 12.8 oz (159.6 kg)   LMP 08/09/2019   BMI 55.10 kg/m    General: Alert and oriented, no apparent distress  Cardiovascular: RRR with no murmurs noted Respiratory: CTA bilaterally  Gastrointestinal: Bowel sounds present. No abdominal pain  Psych: tearful but answers questions appropriately  ASSESSMENT/PLAN:   Hypertension associated with diabetes ([place]) BP 140/96. Orthostatics wnl.  Likely dizziness secondary to noncompliance with medications. -Discussed importance of medication adherence -Given limited finances, refer to CCM for assistance -Refilled medications for HTN and DM -HbA1c 8.0 today, discussed insulin therapy but patient would like to resume meds first, will revisit at next encounter -Lipid Panel -Nutrition consult -Ophthalmology referral -Blood glucose kit ordered -Follow up in 2 weeks     Carollee Leitz, MD [place]

## 2019-08-30 ENCOUNTER — Other Ambulatory Visit: Payer: Self-pay

## 2019-08-30 ENCOUNTER — Ambulatory Visit (INDEPENDENT_AMBULATORY_CARE_PROVIDER_SITE_OTHER): Payer: Self-pay | Admitting: Family Medicine

## 2019-08-30 ENCOUNTER — Encounter: Payer: Self-pay | Admitting: Family Medicine

## 2019-08-30 VITALS — Ht 67.0 in | Wt 351.8 lb

## 2019-08-30 DIAGNOSIS — E1169 Type 2 diabetes mellitus with other specified complication: Secondary | ICD-10-CM

## 2019-08-30 DIAGNOSIS — E1159 Type 2 diabetes mellitus with other circulatory complications: Secondary | ICD-10-CM

## 2019-08-30 DIAGNOSIS — Z8709 Personal history of other diseases of the respiratory system: Secondary | ICD-10-CM

## 2019-08-30 DIAGNOSIS — I10 Essential (primary) hypertension: Secondary | ICD-10-CM

## 2019-08-30 DIAGNOSIS — E119 Type 2 diabetes mellitus without complications: Secondary | ICD-10-CM

## 2019-08-30 LAB — POCT GLYCOSYLATED HEMOGLOBIN (HGB A1C): HbA1c, POC (controlled diabetic range): 8 % — AB (ref 0.0–7.0)

## 2019-08-30 LAB — TB SKIN TEST
Induration: 0 mm
TB Skin Test: NEGATIVE

## 2019-08-30 MED ORDER — METFORMIN HCL 1000 MG PO TABS: 1000.0000 mg | ORAL_TABLET | Freq: Two times a day (BID) | ORAL | 0 refills | Status: DC

## 2019-08-30 MED ORDER — ALBUTEROL SULFATE HFA 108 (90 BASE) MCG/ACT IN AERS: 2.0000 | INHALATION_SPRAY | Freq: Four times a day (QID) | RESPIRATORY_TRACT | 4 refills | Status: DC | PRN

## 2019-08-30 MED ORDER — GLIPIZIDE 5 MG PO TABS: 5.0000 mg | ORAL_TABLET | Freq: Every day | ORAL | 2 refills | Status: DC

## 2019-08-30 MED ORDER — ROSUVASTATIN CALCIUM 20 MG PO TABS
20.0000 mg | ORAL_TABLET | Freq: Every day | ORAL | 0 refills | Status: DC
Start: 2019-08-30 — End: 2019-09-19

## 2019-08-30 MED ORDER — BLOOD GLUCOSE MONITOR KIT: PACK | 0 refills | Status: DC

## 2019-08-30 NOTE — Progress Notes (Signed)
Patient is here for a PPD read.  It was placed on 08/28/2019 in the left forearm @ 1515  PPD RESULTS:  Result: negative Induration: 0 mm  Letter created and given to patient for documentation purposes. Veronda Prude, RN

## 2019-08-30 NOTE — Patient Instructions (Addendum)
It was nice meeting you  today!  Medications refilled Referral to CCM for medication assistance and life stressors  You should pay attention to your hemoglobin A1C.  It is a three month test about your average blood sugar. If the A1C is - <7.0 is great.  That is our goal for treating you. - Between 7.0 and 9.0 is not so good.  We would need to work to do better. - Above 9.0 is terrible.  You would really need to work with [place] to get it under control.    Today's A1C = 8.2   If you have any questions or concerns, please feel free to call the clinic.   Be well,  Dana Allan, MD [facility]

## 2019-08-31 ENCOUNTER — Telehealth: Payer: Self-pay | Admitting: *Deleted

## 2019-08-31 ENCOUNTER — Other Ambulatory Visit: Payer: Medicaid Other

## 2019-08-31 ENCOUNTER — Other Ambulatory Visit: Payer: Self-pay

## 2019-08-31 DIAGNOSIS — E1169 Type 2 diabetes mellitus with other specified complication: Secondary | ICD-10-CM

## 2019-08-31 NOTE — Chronic Care Management (AMB) (Signed)
  Care Management   Note  08/31/2019 Name: Aleena Kirkeby MRN: [id number] DOB: Jun 08, 1985  Jone Baseman Knoebel is a 34 y.o. year old female who is a primary care patient of Dana Allan, MD. I reached out to [name] by phone today in response to a referral sent by Ms. Jone Baseman [employer]en[employer]'s health plan.    Ms. Kreis was given information about care management services today including:  1. Care management services include personalized support from designated clinical staff supervised by her physician, including individualized plan of care and coordination with other care providers 2. 24/7 contact phone numbers for assistance for urgent and routine care needs. 3. The patient may stop care management services at any time by phone call to the office staff.  Patient agreed to services and verbal consent obtained.   Follow up plan: Telephone appointment with care management team member scheduled for: 09/01/2019  [facility] Guide, Embedded Care Coordination [facility]  [place], [place] [address] Direct Dial: [phone] [email or web address] Website: [email or web address]

## 2019-09-01 ENCOUNTER — Encounter: Payer: Self-pay | Admitting: Family Medicine

## 2019-09-01 ENCOUNTER — Ambulatory Visit: Payer: Medicaid Other | Admitting: Licensed Clinical Social Worker

## 2019-09-01 ENCOUNTER — Ambulatory Visit: Payer: Medicaid Other | Admitting: Family Medicine

## 2019-09-01 DIAGNOSIS — Z139 Encounter for screening, unspecified: Secondary | ICD-10-CM

## 2019-09-01 DIAGNOSIS — Z7189 Other specified counseling: Secondary | ICD-10-CM

## 2019-09-01 LAB — LIPID PANEL
Chol/HDL Ratio: 4.5 ratio — ABNORMAL HIGH (ref 0.0–4.4)
Cholesterol, Total: 239 mg/dL — ABNORMAL HIGH (ref 100–199)
HDL: 53 mg/dL (ref >39)
LDL Chol Calc (NIH): 170 mg/dL — ABNORMAL HIGH (ref 0–99)
Triglycerides: 92 mg/dL (ref 0–149)
VLDL Cholesterol Cal: 16 mg/dL (ref 5–40)

## 2019-09-01 MED ORDER — BLOOD GLUCOSE MONITOR KIT: PACK | 0 refills | Status: AC

## 2019-09-01 NOTE — Assessment & Plan Note (Addendum)
BP 140/96. Orthostatics wnl.  Likely dizziness secondary to noncompliance with medications. -Discussed importance of medication adherence -Given limited finances, refer to CCM for assistance -Refilled medications for HTN and DM -HbA1c 8.0 today, discussed insulin therapy but patient would like to resume meds first, will revisit at next encounter -Lipid Panel -Nutrition consult -Ophthalmology referral -Blood glucose kit ordered -Follow up in 2 weeks

## 2019-09-01 NOTE — Chronic Care Management (AMB) (Signed)
Care Management   Clinical Social Work initial Note  09/01/2019 Name: Lisa Cooley MRN: [id number] DOB: August 28, 1985 Lisa Cooley is a 34 y.o. year old female who sees Lisa Leitz, MD for primary care. The Care Management team was consulted by PCP to assist the patient with medication and managing stress.  LCSW reached out to Lisa Cooley today by phone to introduce self, assess needs and barriers to care.    Assessment: Patient is pleasant and engaged in conversation.  Experiencing stress and not feeling well which seems to be exacerbated by not taking her medication.  Patient is tearful that she wants to take her medication but is unable to afford them due to financial strains.        Recommendation: Patient may benefit from, and is in agreement to talk to [facility] RN and pharmacy for any ideas or support.   Plan:  1. CCM RN will reach out to patient to discuss non medication interventions  2.   LCSW will route chart to pharmacy for any suggestions or recommendation for medications 3.   LCSW will F/U with patient in 1 to 2 weeks based on encounter with other team members  Review of patient status, including review of consultants reports, relevant laboratory and other test results, and collaboration with appropriate care team members and the patient's provider was performed as part of comprehensive patient evaluation and provision of chronic care management services.    Advance Directive Status:   Not addressed in this encounter SDOH (Social Determinants of Health) assessments performed: Yes: No needs identified  SDOH Interventions     Most Recent Value  SDOH Interventions  Financial Strain Interventions [id number] Referral  Housing Interventions [id number] Referral  Stress Interventions Provide Counseling      ; Goals Addressed            This Visit's Progress   . Financial strain       CARE PLAN ENTRY (see longitudinal plan of care for additional care plan  information)  Current Barriers:  . Patient with HTN and DMII needs community resources to obtain her medication  . Patient acknowledges deficits and needs support, education and care coordination in order to meet this unmet need  . Due to limited income patient unable to pay bills and afford medication.   Clinical Goal(s)  . Over the next 30 days patient will be able to get medication so that she can start to feel better  Interventions provided by LCSW:  . Assessment of needs and barriers to care as well as how impacting    . Provided patient with information about referral for rent support via Lisa Cooley received permission to make referral to housing coalition and Lisa Cooley . Collaborated with RN Case Manager re: managing chronic conditions,  . Pharmacy referral : Will also route chart to pharmacy to see if they have any recommendations to help with medication . Motivational Interviewing and emotional support. ( patient would like to hold off on counseling as her main focus is getting her medication Patient Self Care Activities & Deficits:  . Patient is unable to independently navigate community resource options without care coordination support  . Patient is motivated to resolve concern  Initial goal documentation      Outpatient Encounter Medications as of 09/01/2019  Medication Sig  . albuterol (VENTOLIN HFA) 108 (90 Base) MCG/ACT inhaler Inhale 2 puffs into the lungs every 6 (six) hours as needed for wheezing or shortness of breath.  . blood  glucose meter kit and supplies KIT Dispense based on patient and insurance preference. Use up to four times daily as directed. (FOR ICD-9 250.00, 250.01).  . DULoxetine (CYMBALTA) 60 MG capsule Take 1 capsule (60 mg total) by mouth daily.  [name] gabapentin (NEURONTIN) 300 MG capsule Take 1 capsule (300 mg total) by mouth 3 (three) times daily.  [name] glipiZIDE (GLUCOTROL) 5 MG tablet Take 1 tablet (5 mg total) by mouth daily before breakfast.  .  HYDROcodone-acetaminophen (NORCO/VICODIN) 5-325 MG tablet Take 1-2 tablets by mouth every 6 (six) hours as needed.  [name] lisinopril (ZESTRIL) 10 MG tablet Take 1 tablet (10 mg total) by mouth at bedtime.  . metFORMIN (GLUCOPHAGE) 1000 MG tablet Take 1 tablet (1,000 mg total) by mouth 2 (two) times daily with a meal.  . naproxen (NAPROSYN) 500 MG tablet Take 1 tablet (500 mg total) by mouth 2 (two) times daily.  . rosuvastatin (CRESTOR) 20 MG tablet Take 1 tablet (20 mg total) by mouth daily.  [name] tiZANidine (ZANAFLEX) 4 MG tablet Take 1-2 tablets (4-8 mg total) by mouth every 6 (six) hours as needed for muscle spasms.   No facility-administered encounter medications on file as of 09/01/2019.       Information about Care Management services was shared with Ms.  Cooley today including:  1. Care Management services include personalized support from designated clinical staff supervised by her physician, including individualized plan of care and coordination with other care providers 2. Remind patient of 24/7 contact phone numbers to provider's office for assistance with urgent and routine care needs. 3. Care Management services are voluntary and patient may stop at any time .  Patient agreed to services provided today and verbal consent obtained.     Lisa Cooley, [place] / [place]   [phone] 4:26 PM

## 2019-09-04 ENCOUNTER — Encounter: Payer: Self-pay | Admitting: Family Medicine

## 2019-09-04 ENCOUNTER — Telehealth: Payer: Self-pay | Admitting: *Deleted

## 2019-09-04 NOTE — Chronic Care Management (AMB) (Signed)
  Care Management   Note  09/04/2019 Name: Lisa Cooley MRN: [id number] DOB: [birth year]  Lisa Cooley is a 34 y.o. year old female who is a primary care patient of Dana Allan, MD and is actively engaged with the care management team. I reached out to [name] by phone today to assist with scheduling an initial visit with the RN Case Manager.  Follow up plan: Telephone appointment with care management team member scheduled for: 09/06/2019  [facility] Guide, Embedded Care Coordination [facility]  [place], [place] [address] Direct Dial: [phone] [email or web address] [email or web address] Website: [email or web address]

## 2019-09-06 ENCOUNTER — Telehealth: Payer: Medicaid Other

## 2019-09-06 ENCOUNTER — Other Ambulatory Visit: Payer: Self-pay

## 2019-09-08 ENCOUNTER — Telehealth: Payer: Self-pay | Admitting: Student-PharmD

## 2019-09-08 ENCOUNTER — Telehealth: Payer: Self-pay

## 2019-09-08 ENCOUNTER — Other Ambulatory Visit: Payer: Self-pay

## 2019-09-08 ENCOUNTER — Telehealth: Payer: Medicaid Other

## 2019-09-08 NOTE — Telephone Encounter (Signed)
Called patient to discuss medication affordability and to see which medications were expensive for her. Patient seemed somewhat confused about the pricing of her medications but stated some prices such as $30 for lisinopril, $18 for glipizide and metformin combined, and $13 for rosuvastatin. She stated that she has problems affording her medications and can only pick up a few at a time. She fills at [facility] on [address]. She states that she used to fill her medications at [facility] clinic and they were cheaper for her there.   Called [facility] to confirm the cost of each medication for the patient since some of these medications that she stated higher prices for are on their $4 list. They stated that they run her medications under GoodRx but if the [facility] price (i.e. the $4 or $10 list) are cheaper then it defaults to the lower price. They confirmed the following prices for her medications:  Medications on the [facility] $4 or $10 list:  - Glipizide 5mg  30 DS = $4 - Metformin 1000mg  90 DS = $10 (30 DS is $4-6) - Lisinopril 10mg  30 DS = $4  Medications that run on GoodRx (and therefore vary in price from fill to fill): - Rosuvastatin 20mg  30 DS ~$12 - Albuterol inhaler ~$25-40 - Gabapentin 300mg  30 DS ~$19 - Duloxetine 60mg  30 DS ~$15-20 (has not been filled since 2019 per their records) - Tizanidine last filled for 3 DS ~$9 - Naproxen last filled for 15 DS ~$7  Not on GoodRx or [facility] lists:  - Hydrocodone-APAP 5-325 last filled for 2 DS ~$10  According to this information, the patient is receiving her medications at the lowest prices possible. Next step is to determine if she can switch to being seen at [facility] and Wellness if her medications are indeed cheaper there than listed above.

## 2019-09-08 NOTE — Telephone Encounter (Signed)
°  Care Management   Outreach Note  09/08/2019 Name: Lisa Cooley MRN: [id number] DOB: 14-Jun-1985  Referred by: Dana Allan, MD Reason for referral : No chief complaint on file.   An unsuccessful telephone outreach was attempted today. The patient was referred to the case management team for assistance with care management and care coordination.   Follow Up Plan: A HIPPA compliant phone message was left for the patient providing contact information and requesting a return call.  The care management team will reach out to the patient again over the next 7-14 days.   Juanell Fairly RN, BSN, [facility] Care Management Coordinator [facility] Phone: [phone] Fax: [phone]

## 2019-09-13 ENCOUNTER — Encounter (HOSPITAL_COMMUNITY): Payer: Self-pay | Admitting: Emergency Medicine

## 2019-09-13 ENCOUNTER — Other Ambulatory Visit: Payer: Self-pay

## 2019-09-13 ENCOUNTER — Ambulatory Visit (HOSPITAL_COMMUNITY)
Admission: EM | Admit: 2019-09-13 | Discharge: 2019-09-13 | Disposition: A | Discharge status: Home / Self Care | Payer: Medicaid Other | Attending: Family Medicine | Admitting: Family Medicine

## 2019-09-13 DIAGNOSIS — Z791 Long term (current) use of non-steroidal anti-inflammatories (NSAID): Secondary | ICD-10-CM | POA: Insufficient documentation

## 2019-09-13 DIAGNOSIS — B349 Viral infection, unspecified: Secondary | ICD-10-CM | POA: Insufficient documentation

## 2019-09-13 DIAGNOSIS — Z20822 Contact with and (suspected) exposure to covid-19: Secondary | ICD-10-CM | POA: Insufficient documentation

## 2019-09-13 DIAGNOSIS — Z87891 Personal history of nicotine dependence: Secondary | ICD-10-CM | POA: Insufficient documentation

## 2019-09-13 DIAGNOSIS — I10 Essential (primary) hypertension: Secondary | ICD-10-CM | POA: Insufficient documentation

## 2019-09-13 DIAGNOSIS — Z79899 Other long term (current) drug therapy: Secondary | ICD-10-CM | POA: Insufficient documentation

## 2019-09-13 DIAGNOSIS — E1159 Type 2 diabetes mellitus with other circulatory complications: Secondary | ICD-10-CM | POA: Insufficient documentation

## 2019-09-13 DIAGNOSIS — Z7984 Long term (current) use of oral hypoglycemic drugs: Secondary | ICD-10-CM | POA: Insufficient documentation

## 2019-09-13 DIAGNOSIS — Z8249 Family history of ischemic heart disease and other diseases of the circulatory system: Secondary | ICD-10-CM | POA: Insufficient documentation

## 2019-09-13 LAB — SARS CORONAVIRUS 2 (TAT 6-24 HRS): SARS Coronavirus 2: NEGATIVE

## 2019-09-13 NOTE — Discharge Instructions (Addendum)
We have tested you for COVID  Go home and quarantine until we get our results.  You can take OTC medications as needed.  Check my chart for results.

## 2019-09-13 NOTE — ED Triage Notes (Signed)
Patient presents to 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  States she is in the house every week.  Patient c/o cough, and body aches x 3 days.

## 2019-09-14 NOTE — ED Provider Notes (Signed)
[place]    CSN: [id number] Arrival date & time: 09/13/19  9629      History   Chief Complaint Chief Complaint  Patient presents with  . Covid Exposure    HPI Lisa Cooley is a 34 y.o. female.   Patient is a 34 year old female who presents today for Covid exposure.  Reporting she has had some cough, body aches x3 days.  Symptoms have been constant.  She would like to be Covid tested.       Past Medical History:  Diagnosis Date  . Diabetes mellitus    Dx in [place] but not on meds  . Headache(784.0)   . Hypertension     Patient Active Problem List   Diagnosis Date Noted  . Hypertension associated with diabetes ([place]) 08/18/2018  . Healthcare maintenance 08/18/2018  . Screening for malignant neoplasm of cervix 12/20/2017  . Positive depression screening 12/20/2017  . Unprotected sex 09/17/2017  . 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 Screen for STD (sexually transmitted disease) 09/17/2017  . Diabetes mellitus type 2 in obese ([place]) 12/31/2016  . Epigastric pain 08/13/2011  . DM2 (diabetes mellitus, type 2) ([place]) 08/12/2011  . Morbid obesity ([place]) 08/12/2011  . Infertility, female 08/12/2011  . Irregular periods 08/12/2011    Past Surgical History:  Procedure Laterality Date  . NO PAST SURGERIES      OB History    Gravida  0   Para  0   Term  0   Preterm      AB  0   Living  0     SAB  0   TAB      Ectopic      Multiple      Live Births               Home Medications    Prior to Admission medications   Medication Sig Start Date End Date Taking? Authorizing Provider  blood glucose meter kit and supplies KIT Dispense based on patient and insurance preference. Use up to four times daily as directed. (FOR ICD-9 250.00, 250.01). 09/01/19   Carollee Leitz, MD  gabapentin (NEURONTIN) 300 MG capsule Take 1 capsule (300 mg total) by mouth 3 (three) times daily. 06/08/19   Scot Jun, FNP  glipiZIDE  (GLUCOTROL) 5 MG tablet Take 1 tablet (5 mg total) by mouth daily before breakfast. 08/30/19   Carollee Leitz, MD  lisinopril (ZESTRIL) 10 MG tablet Take 1 tablet (10 mg total) by mouth at bedtime. 08/28/19   Carollee Leitz, MD  metFORMIN (GLUCOPHAGE) 1000 MG tablet Take 1 tablet (1,000 mg total) by mouth 2 (two) times daily with a meal. 08/30/19   Carollee Leitz, MD  naproxen (NAPROSYN) 500 MG tablet Take 1 tablet (500 mg total) by mouth 2 (two) times daily. 08/04/19   Raylene Everts, MD  rosuvastatin (CRESTOR) 20 MG tablet Take 1 tablet (20 mg total) by mouth daily. 08/30/19   Carollee Leitz, MD  tiZANidine (ZANAFLEX) 4 MG tablet Take 1-2 tablets (4-8 mg total) by mouth every 6 (six) hours as needed for muscle spasms. 08/04/19   Raylene Everts, MD  albuterol (VENTOLIN HFA) 108 (90 Base) MCG/ACT inhaler Inhale 2 puffs into the lungs every 6 (six) hours as needed for wheezing or shortness of breath. 08/30/19 09/13/19  Carollee Leitz, MD  DULoxetine (CYMBALTA) 60 MG capsule Take 1 capsule (60 mg total) by mouth daily. 12/20/17 09/13/19  Benay Pike, MD  Family History Family History  Problem Relation Age of Onset  . Cancer Father   . Heart disease Sister   . Other Neg Hx     Social History Social History   Tobacco Use  . Smoking status: Former Smoker    Packs/day: 0.25    Types: Cigarettes    Quit date: 04/20/2014    Years since quitting: 5.4  . Smokeless tobacco: Never Used  Substance Use Topics  . Alcohol use: No    Alcohol/week: 0.0 standard drinks  . Drug use: Yes    Types: Marijuana    Comment: last use 2 days ago     Allergies   Patient has no known allergies.   Review of Systems Review of Systems   Physical Exam Triage Vital Signs ED Triage Vitals  Enc Vitals Group     BP 09/13/19 0943 (!) 163/111     Pulse Rate 09/13/19 0943 78     Resp 09/13/19 0943 16     Temp 09/13/19 0943 98.3 F (36.8 C)     Temp Source 09/13/19 0943 Oral     SpO2 09/13/19 0943 100 %      Weight --      Height --      Head Circumference --      Peak Flow --      Pain Score 09/13/19 0945 9     Pain Loc --      Pain Edu? --      Excl. in [place]? --    No data found.  Updated Vital Signs BP (!) 163/111 (BP Location: Left Wrist)   Pulse 78   Temp 98.3 F (36.8 C) (Oral)   Resp 16   LMP 09/10/2019   SpO2 100%   Visual Acuity Right Eye Distance:   Left Eye Distance:   Bilateral Distance:    Right Eye Near:   Left Eye Near:    Bilateral Near:     Physical Exam Vitals and nursing note reviewed.  Constitutional:      General: She is not in acute distress.    Appearance: Normal appearance. She is not ill-appearing, toxic-appearing or diaphoretic.  HENT:     Head: Normocephalic.     Right Ear: Tympanic membrane and ear canal normal.     Left Ear: Tympanic membrane and ear canal normal.     Nose: Nose normal.     Mouth/Throat:     Pharynx: Oropharynx is clear.  Eyes:     Conjunctiva/sclera: Conjunctivae normal.  Cardiovascular:     Rate and Rhythm: Normal rate and regular rhythm.  Pulmonary:     Effort: Pulmonary effort is normal.     Breath sounds: Normal breath sounds.  Musculoskeletal:        General: Normal range of motion.     Cervical back: Normal range of motion.  Skin:    General: Skin is warm and dry.     Findings: No rash.  Neurological:     Mental Status: She is alert.  Psychiatric:        Mood and Affect: Mood normal.      UC Treatments / Results  Labs (all labs ordered are listed, but only abnormal results are displayed) Labs Reviewed  SARS CORONAVIRUS 2 (TAT 6-24 HRS)    EKG   Radiology No results found.  Procedures Procedures (including critical care time)  Medications Ordered in UC Medications - No data to display  Initial Impression / Assessment and Plan /  UC Course  I have reviewed the triage vital signs and the nursing notes.  Pertinent labs & imaging results that were available during my care of the patient were  reviewed by me and considered in my medical decision making (see chart for details).     Viral illness Over-the-counter medicines as needed. Covid swab pending. Follow up as needed for continued or worsening symptoms  Final Clinical Impressions(s) / UC Diagnoses   Final diagnoses:  Viral illness     Discharge Instructions     We have tested you for COVID  Go home and quarantine until we get our results.  You can take OTC medications as needed.  Check my chart for results.     ED Prescriptions    None     PDMP not reviewed this encounter.   Bast, Traci A, NP 09/14/19 1035  

## 2019-09-15 NOTE — Telephone Encounter (Signed)
Yes just schedule her.  I thought I had sent that back to the team.    Thanks.[name]

## 2019-09-19 ENCOUNTER — Telehealth: Payer: Self-pay | Admitting: Pharmacist

## 2019-09-19 DIAGNOSIS — E119 Type 2 diabetes mellitus without complications: Secondary | ICD-10-CM

## 2019-09-19 MED ORDER — LANCET DEVICES MISC: 1.0000 | Freq: Two times a day (BID) | 11 refills | Status: AC | PRN

## 2019-09-19 MED ORDER — GLIPIZIDE 5 MG PO TABS: 5.0000 mg | ORAL_TABLET | Freq: Every day | ORAL | 5 refills | Status: DC

## 2019-09-19 MED ORDER — GLUCOSE BLOOD VI STRP: ORAL_STRIP | 12 refills | Status: AC

## 2019-09-19 MED ORDER — LISINOPRIL 10 MG PO TABS: 10.0000 mg | ORAL_TABLET | Freq: Every day | ORAL | 5 refills | Status: DC

## 2019-09-19 MED ORDER — GABAPENTIN 300 MG PO CAPS: 300.0000 mg | ORAL_CAPSULE | Freq: Three times a day (TID) | ORAL | 5 refills | Status: DC

## 2019-09-19 MED ORDER — METFORMIN HCL 1000 MG PO TABS: 1000.0000 mg | ORAL_TABLET | Freq: Two times a day (BID) | ORAL | 5 refills | Status: DC

## 2019-09-19 MED ORDER — ROSUVASTATIN CALCIUM 20 MG PO TABS: 20.0000 mg | ORAL_TABLET | Freq: Every day | ORAL | 5 refills | Status: DC

## 2019-09-19 MED ORDER — TRUE METRIX METER W/DEVICE KIT: 1.0000 | PACK | Freq: Once | 0 refills | Status: AC

## 2019-09-19 MED FILL — TRUEplus LANCETS 28G MISC: 50 days supply | Qty: 100 | Fill #0

## 2019-09-19 MED FILL — TRUE METRIX TEST STRIP: 50 days supply | Qty: 100 | Fill #0

## 2019-09-19 MED FILL — !TRUE METRIX BLOOD GLUCOSE: 30 days supply | Qty: 1 | Fill #0

## 2019-09-19 NOTE — Addendum Note (Signed)
Addended by: Kathrin Ruddy on: 09/19/2019 02:32 PM   Modules accepted: Orders

## 2019-09-19 NOTE — Telephone Encounter (Signed)
Called and spoke to patient-price all together at [facility] for 5 meds is $12.  Pt also requests True Metrix meter, True Metrix test strips and lancets be called in.  She is aware that the meter, test strips and lancets is an additional $12.

## 2019-09-19 NOTE — Telephone Encounter (Signed)
Requested DM meter and supplies requested by patient.  New prescriptions sent to [facility] as requested.

## 2019-09-19 NOTE — Telephone Encounter (Signed)
Noted and agree. 

## 2019-09-19 NOTE — Telephone Encounter (Signed)
Patient discussed with Beatris Si, Pharmacy Technician and the option of having patient return to [facility] pharmacy was thought to be the best option.   Patient was contacted by Pervis Hocking, PharmD, PGY-1 Pharmacy Resident to communicate plan for lower drug costs. Patient was willing to make switch for lower drug costs.    New prescriptions for maintenance meds which she is taking currently sent to [facility] with additional refills for total of 6 months.

## 2019-09-20 ENCOUNTER — Ambulatory Visit: Payer: Medicaid Other | Admitting: Licensed Clinical Social Worker

## 2019-09-20 DIAGNOSIS — F439 Reaction to severe stress, unspecified: Secondary | ICD-10-CM

## 2019-09-20 DIAGNOSIS — Z139 Encounter for screening, unspecified: Secondary | ICD-10-CM

## 2019-09-20 NOTE — Chronic Care Management (AMB) (Signed)
Care Management   Clinical Social Work Follow Up   09/20/2019 Name: Lisa Cooley MRN: [id number] DOB: August 30, 1985 Referred by: Carollee Leitz, MD  Reason for referral : Care Coordination (F/U)  Lisa Cooley is a 34 y.o. year old female who is a primary care patient of Carollee Leitz, MD.  Reason for follow-up: assess for barriers and progress with care plan .   Assessment: Patient is excited that her medication cost is now at a price she can afford with community health and wellness however she is concerned that she does not want to leave [facility] and her PCP Dr. Volanda Napoleon.  Patient wants to know if Dr. Volanda Napoleon will continue to be her PCP. ( LCSW will inquire about her concern) Plan:  1. Patient will F/U on resources provided 2.   LCSW will F/U in 2 weeks Advance Directive Status: ; not addressed during this encounter.  SDOH (Social Determinants of Health) assessments performed; No needs identified   Goals Addressed            This Visit's Progress   . Financial strain   On track    CARE PLAN ENTRY (see longitudinal plan of care for additional care plan information)  Current Barriers:  . Patient with HTN and DMII needs community resources to obtain her medication  . Patient acknowledges deficits and needs support, education and care coordination in order to meet this unmet need  . Due to limited income patient unable to pay bills and afford medication.   [name] [place] referrals accepted. They have not been successful in reaching patient . Pharmacy team working with patient to explore options to afford her medication Clinical Goal(s)  . Over the next 30 days patient will be able to get medication so that she can start to feel better  Interventions provided by LCSW:  . Assessment of needs and barriers to care as well as how impacting    . Provided patient with information phone numbers to F/U on referral for rent support via [place] with housing coalition and [employer] ( both have tried  to contact her) . Collaborated with RN Case Manager re: managing chronic conditions, ( unsuccessful outreach) . Motivational Interviewing, solution focused and emotional support provided Patient Self Care Activities & Deficits:  . Patient is unable to independently navigate community resource options without care coordination support  . Patient is motivated to resolve concern  . Will F/U with housing coalition and [facility] Initial goal documentation and Please see past updates related to this goal by clicking on the "Past Updates" button in the selected goal       Outpatient Encounter Medications as of 09/20/2019  Medication Sig  . blood glucose meter kit and supplies KIT Dispense based on patient and insurance preference. Use up to four times daily as directed. (FOR ICD-9 250.00, 250.01).  [name] gabapentin (NEURONTIN) 300 MG capsule Take 1 capsule (300 mg total) by mouth 3 (three) times daily.  [name] glipiZIDE (GLUCOTROL) 5 MG tablet Take 1 tablet (5 mg total) by mouth daily before breakfast.  . glucose blood test strip Use as instructed  . Lancet Devices MISC 1 Container by Does not apply route 2 (two) times daily as needed. As directed.  [name] lisinopril (ZESTRIL) 10 MG tablet Take 1 tablet (10 mg total) by mouth at bedtime.  . metFORMIN (GLUCOPHAGE) 1000 MG tablet Take 1 tablet (1,000 mg total) by mouth 2 (two) times daily with a meal.  . naproxen (NAPROSYN) 500 MG tablet Take  1 tablet (500 mg total) by mouth 2 (two) times daily.  . rosuvastatin (CRESTOR) 20 MG tablet Take 1 tablet (20 mg total) by mouth daily.  [name] tiZANidine (ZANAFLEX) 4 MG tablet Take 1-2 tablets (4-8 mg total) by mouth every 6 (six) hours as needed for muscle spasms.  . [DISCONTINUED] albuterol (VENTOLIN HFA) 108 (90 Base) MCG/ACT inhaler Inhale 2 puffs into the lungs every 6 (six) hours as needed for wheezing or shortness of breath.  . [DISCONTINUED] DULoxetine (CYMBALTA) 60 MG capsule Take 1 capsule (60 mg total) by mouth daily.   No  facility-administered encounter medications on file as of 09/20/2019.   Review of patient status, including review of consultants reports, relevant laboratory and other test results, and collaboration with appropriate care team members and the patient's provider was performed as part of comprehensive patient evaluation and provision of care management services.    Casimer Lanius, [place] / [place]   [phone] 4:26 PM

## 2019-09-21 ENCOUNTER — Ambulatory Visit: Payer: Medicaid Other

## 2019-09-21 ENCOUNTER — Telehealth: Payer: Self-pay | Admitting: *Deleted

## 2019-09-21 NOTE — Chronic Care Management (AMB) (Signed)
  Care Management   Note  09/21/2019 Name: Lisa Cooley MRN: [id number] DOB: 1985-02-21  Lisa Cooley is a 33 y.o. year old female who is a primary care patient of Dana Allan, MD and is actively engaged with the care management team. I reached out to [name] by phone today to assist with re-scheduling an initial visit with the RN Case Manager.  Follow up plan: Telephone appointment with care management team member scheduled for:09/21/2019  [facility] Guide, Embedded Care Coordination [facility]  [place], [place] [address] Direct Dial: [phone] [email or web address]

## 2019-09-21 NOTE — Chronic Care Management (AMB) (Signed)
Care Management   Initial Visit Note  09/21/2019 Name: Lisa Cooley MRN: [id number] DOB: 1985/12/30   Assessment: Lisa Cooley is a 34 y.o. year old female who sees Lisa Leitz, MD for primary care. The care management team was consulted for assistance with care management and care coordination needs related to Disease Management Educational Needs for DM II/ HTN.   Review of patient status, including review of consultants reports, relevant laboratory and other test results, and collaboration with appropriate care team members and the patient's provider was performed as part of comprehensive patient evaluation and provision of care management services.    SDOH (Social Determinants of Health) assessments performed: No See Care Plan activities for detailed interventions related to [facility])     Outpatient Encounter Medications as of 09/21/2019  Medication Sig Note  . gabapentin (NEURONTIN) 300 MG capsule Take 1 capsule (300 mg total) by mouth 3 (three) times daily.   [name] glipiZIDE (GLUCOTROL) 5 MG tablet Take 1 tablet (5 mg total) by mouth daily before breakfast.   . lisinopril (ZESTRIL) 10 MG tablet Take 1 tablet (10 mg total) by mouth at bedtime.   . Meloxicam 15 MG TBDP Take by mouth once.   . metFORMIN (GLUCOPHAGE) 1000 MG tablet Take 1 tablet (1,000 mg total) by mouth 2 (two) times daily with a meal.   . naproxen (NAPROSYN) 500 MG tablet Take 1 tablet (500 mg total) by mouth 2 (two) times daily.   . blood glucose meter kit and supplies KIT Dispense based on patient and insurance preference. Use up to four times daily as directed. (FOR ICD-9 250.00, 250.01).   [name] glucose blood test strip Use as instructed   . Lancet Devices MISC 1 Container by Does not apply route 2 (two) times daily as needed. As directed.   . rosuvastatin (CRESTOR) 20 MG tablet Take 1 tablet (20 mg total) by mouth daily. (Patient not taking: Reported on 09/21/2019) 09/21/2019: She has not received it  .  tiZANidine (ZANAFLEX) 4 MG tablet Take 1-2 tablets (4-8 mg total) by mouth every 6 (six) hours as needed for muscle spasms. (Patient not taking: Reported on 09/21/2019)   . [DISCONTINUED] albuterol (VENTOLIN HFA) 108 (90 Base) MCG/ACT inhaler Inhale 2 puffs into the lungs every 6 (six) hours as needed for wheezing or shortness of breath.   . [DISCONTINUED] DULoxetine (CYMBALTA) 60 MG capsule Take 1 capsule (60 mg total) by mouth daily.    No facility-administered encounter medications on file as of 09/21/2019.    Goals Addressed              This Visit's Progress   .  Idon't have the mmoney for a meter to check my blood sugars (pt-stated)        CARE PLAN ENTRY (see longtitudinal plan of care for additional care plan information)  Objective:  Lab Results  Component Value Date   HGBA1C 8.0 (A) 08/30/2019 .   Lab Results  Component Value Date   CREATININE 0.71 01/28/2019   CREATININE 0.79 08/13/2018   CREATININE 0.71 05/10/2017 .   [name] No results found for: EGFR  Current Barriers:  [name] Knowledge Deficits related to basic Diabetes pathophysiology and self care/management . Difficulty obtaining or cannot afford medications . Does not have glucometer to monitor blood sugar . Financial Constraints  Case Manager Clinical Goal(s):  Over the next 30 days, patient will demonstrate improved adherence to prescribed treatment plan for diabetes self care/management as evidenced by: lowering her a1c  by 1-2 points . daily monitoring and recording of CBG  . adherence to prescribed medication regimen  Interventions:  . Provided education to patient about basic DM disease process . Reviewed medications with patient and discussed importance of medication adherence . Discussed plans with patient for ongoing care management follow up and provided patient with direct contact information for care management team . Will Provided patient with written educational materials in the mail about in a  diabetes packet. . Discussed with the patient about cooking in her air fryer and using the canned vegetables that she has and not cooking them in the juice that they are canned but pour it out and wash them off and then cook them in fresh water. . Plan to get her a glucometer that is reasonable for her they Relion glucometer at [place] that are low cost and the supplies. Then checking in the morning , and also adding water in her diet  and eating more than once a day. .  Patient Self Care Activities:  . UNABLE to independently to self manage diabetes . Self administers oral medications as prescribed . Attends all scheduled provider appointments  Initial goal documentation         Follow up plan:  The care management team will reach out to the patient again over the next 14 days.   Lisa Cooley was given information about Care Management services today including:  1. Care Management services include personalized support from designated clinical staff supervised by a physician, including individualized plan of care and coordination with other care providers 2. 24/7 contact phone numbers for assistance for urgent and routine care needs. 3. The patient may stop Care Management services at any time (effective at the end of the month) by phone call to the office staff.  Patient agreed to services and verbal consent obtained.  Lazaro Arms RN, BSN, [facility] Care Management Coordinator [place] Phone: [phone] Fax: [phone]

## 2019-09-21 NOTE — Patient Instructions (Signed)
Visit Information  Goals Addressed              This Visit's Progress   .  Idon't have the mmoney for a meter to check my blood sugars (pt-stated)        CARE PLAN ENTRY (see longtitudinal plan of care for additional care plan information)  Objective:  Lab Results  Component Value Date   HGBA1C 8.0 (A) 08/30/2019 .   Lab Results  Component Value Date   CREATININE 0.71 01/28/2019   CREATININE 0.79 08/13/2018   CREATININE 0.71 05/10/2017 .   [name] No results found for: EGFR  Current Barriers:  [name] Knowledge Deficits related to basic Diabetes pathophysiology and self care/management . Difficulty obtaining or cannot afford medications . Does not have glucometer to monitor blood sugar . Financial Constraints  Case Manager Clinical Goal(s):  Over the next 30 days, patient will demonstrate improved adherence to prescribed treatment plan for diabetes self care/management as evidenced by: lowering her a1c by 1-2 points . daily monitoring and recording of CBG  . adherence to prescribed medication regimen  Interventions:  . Provided education to patient about basic DM disease process . Reviewed medications with patient and discussed importance of medication adherence . Discussed plans with patient for ongoing care management follow up and provided patient with direct contact information for care management team . Will Provided patient with written educational materials in the mail about in a diabetes packet. . Discussed with the patient about cooking in her air fryer and using the canned vegetables that she has and not cooking them in the juice that they are canned but pour it out and wash them off and then cook them in fresh water. . Plan to get her a glucometer that is reasonable for her they Relion glucometer at [place] that are low cost and the supplies. Then checking in the morning , and also adding water in her diet  and eating more than once a day. .  Patient Self Care Activities:    . UNABLE to independently to self manage diabetes . Self administers oral medications as prescribed . Attends all scheduled provider appointments  Initial goal documentation        Lisa Cooley was given information about Care Management services today including:  1. Care Management services include personalized support from designated clinical staff supervised by her physician, including individualized plan of care and coordination with other care providers 2. 24/7 contact phone numbers for assistance for urgent and routine care needs. 3. The patient may stop CCM services at any time (effective at the end of the month) by phone call to the office staff.  Patient agreed to services and verbal consent obtained.   The patient verbalized understanding of instructions provided today and declined a print copy of patient instruction materials.   The care management team will reach out to the patient again over the next 14 days.   Lazaro Arms RN, BSN, [facility] Care Management Coordinator [place] Phone: [phone] Fax: [phone]

## 2019-09-27 ENCOUNTER — Other Ambulatory Visit: Payer: Self-pay | Admitting: Family Medicine

## 2019-09-27 DIAGNOSIS — M62838 Other muscle spasm: Secondary | ICD-10-CM

## 2019-09-28 MED ORDER — TIZANIDINE HCL 4 MG PO TABS: 4.0000 mg | ORAL_TABLET | Freq: Four times a day (QID) | ORAL | 0 refills | Status: DC | PRN

## 2019-10-02 ENCOUNTER — Encounter (HOSPITAL_COMMUNITY): Payer: Self-pay | Admitting: Emergency Medicine

## 2019-10-02 ENCOUNTER — Other Ambulatory Visit: Payer: Self-pay

## 2019-10-02 ENCOUNTER — Ambulatory Visit (HOSPITAL_COMMUNITY)
Admission: EM | Admit: 2019-10-02 | Discharge: 2019-10-02 | Disposition: A | Discharge status: Home / Self Care | Payer: Medicaid Other | Attending: Family Medicine | Admitting: Family Medicine

## 2019-10-02 DIAGNOSIS — R52 Pain, unspecified: Secondary | ICD-10-CM | POA: Insufficient documentation

## 2019-10-02 DIAGNOSIS — R6883 Chills (without fever): Secondary | ICD-10-CM

## 2019-10-02 DIAGNOSIS — U071 COVID-19: Secondary | ICD-10-CM | POA: Insufficient documentation

## 2019-10-02 DIAGNOSIS — Z20822 Contact with and (suspected) exposure to covid-19: Secondary | ICD-10-CM | POA: Diagnosis present

## 2019-10-02 LAB — SARS CORONAVIRUS 2 (TAT 6-24 HRS): SARS Coronavirus 2: POSITIVE — AB

## 2019-10-02 NOTE — Discharge Instructions (Addendum)
Continue use of over the counter medications for your symptoms, including naproxen and tylenol as needed as well as cold and flu medications. Drink lots of fluids, get lots of rest. Let us know if you begin to feel worse at any time.

## 2019-10-02 NOTE — ED Provider Notes (Signed)
[place]    CSN: [id number] Arrival date & time: 10/02/19  3159      History   Chief Complaint Chief Complaint  Patient presents with  . Generalized Body Aches    HPI Lisa Cooley is a 34 y.o. female.   Started Friday with body aches, loss of taste or smell, congestion, fever for one day but none since, feeling cold, cough and chest tightness. Denies SOB, N/V, CP, anorexia, diarrhea. Nephew who lives with them tested positive last week for COVID. Taking sudafed, nyquil, alka seltzer pills and naproxen without much relief.      Past Medical History:  Diagnosis Date  . Diabetes mellitus    Dx in [place] but not on meds  . Headache(784.0)   . Hypertension     Patient Active Problem List   Diagnosis Date Noted  . Hypertension associated with diabetes ([place]) 08/18/2018  . Healthcare maintenance 08/18/2018  . Screening for malignant neoplasm of cervix 12/20/2017  . Positive depression screening 12/20/2017  . Unprotected sex 09/17/2017  . 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 Screen for STD (sexually transmitted disease) 09/17/2017  . Diabetes mellitus type 2 in obese ([place]) 12/31/2016  . Epigastric pain 08/13/2011  . DM2 (diabetes mellitus, type 2) ([place]) 08/12/2011  . Morbid obesity ([place]) 08/12/2011  . Infertility, female 08/12/2011  . Irregular periods 08/12/2011    Past Surgical History:  Procedure Laterality Date  . NO PAST SURGERIES      OB History    Gravida  0   Para  0   Term  0   Preterm      AB  0   Living  0     SAB  0   TAB      Ectopic      Multiple      Live Births               Home Medications    Prior to Admission medications   Medication Sig Start Date End Date Taking? Authorizing Provider  blood glucose meter kit and supplies KIT Dispense based on patient and insurance preference. Use up to four times daily as directed. (FOR ICD-9 250.00, 250.01). 09/01/19   Carollee Leitz, MD  gabapentin  (NEURONTIN) 300 MG capsule Take 1 capsule (300 mg total) by mouth 3 (three) times daily. 09/19/19   Leavy Cella, RPH-CPP  glipiZIDE (GLUCOTROL) 5 MG tablet Take 1 tablet (5 mg total) by mouth daily before breakfast. 09/19/19   Leavy Cella, RPH-CPP  glucose blood test strip Use as instructed 09/19/19   Zenia Resides, MD  Lancet Devices MISC 1 Container by Does not apply route 2 (two) times daily as needed. As directed. 09/19/19   Zenia Resides, MD  lisinopril (ZESTRIL) 10 MG tablet Take 1 tablet (10 mg total) by mouth at bedtime. 09/19/19   Leavy Cella, RPH-CPP  Meloxicam 15 MG TBDP Take by mouth once.    [provider]  metFORMIN (GLUCOPHAGE) 1000 MG tablet Take 1 tablet (1,000 mg total) by mouth 2 (two) times daily with a meal. 09/19/19   Leavy Cella, RPH-CPP  naproxen (NAPROSYN) 500 MG tablet Take 1 tablet (500 mg total) by mouth 2 (two) times daily. 08/04/19   Raylene Everts, MD  rosuvastatin (CRESTOR) 20 MG tablet Take 1 tablet (20 mg total) by mouth daily. Patient not taking: Reported on 09/21/2019 09/19/19   Leavy Cella, RPH-CPP  tiZANidine (ZANAFLEX)  4 MG tablet Take 1-2 tablets (4-8 mg total) by mouth every 6 (six) hours as needed for muscle spasms. 09/28/19   Carollee Leitz, MD  albuterol (VENTOLIN HFA) 108 (90 Base) MCG/ACT inhaler Inhale 2 puffs into the lungs every 6 (six) hours as needed for wheezing or shortness of breath. 08/30/19 09/13/19  Carollee Leitz, MD  DULoxetine (CYMBALTA) 60 MG capsule Take 1 capsule (60 mg total) by mouth daily. 12/20/17 09/13/19  Benay Pike, MD    Family History Family History  Problem Relation Age of Onset  . Cancer Father   . Heart disease Sister   . Other Neg Hx     Social History Social History   Tobacco Use  . Smoking status: Former Smoker    Packs/day: 0.25    Types: Cigarettes    Quit date: 04/20/2014    Years since quitting: 5.4  . Smokeless tobacco: Never Used  Substance Use Topics  . Alcohol use: No     Alcohol/week: 0.0 standard drinks  . Drug use: Yes    Types: Marijuana    Comment: last use 2 days ago     Allergies   Patient has no known allergies.   Review of Systems Review of Systems  Constitutional: Positive for chills and fatigue.  HENT: Positive for congestion.        Loss of taste and smell  Eyes: Negative.   Respiratory: Positive for cough.   Cardiovascular: Negative.   Gastrointestinal: Negative.   Genitourinary: Negative.   Musculoskeletal: Positive for myalgias.  Skin: Negative.   Neurological: Negative.   Psychiatric/Behavioral: Negative.      Physical Exam Triage Vital Signs ED Triage Vitals  Enc Vitals Group     BP 10/02/19 1015 (!) 148/95     Pulse Rate 10/02/19 1015 90     Resp 10/02/19 1015 18     Temp 10/02/19 1015 99.4 F (37.4 C)     Temp Source 10/02/19 1015 Oral     SpO2 10/02/19 1015 96 %     Weight --      Height --      Head Circumference --      Peak Flow --      Pain Score 10/02/19 1014 10     Pain Loc --      Pain Edu? --      Excl. in [place]? --    No data found.  Updated Vital Signs BP (!) 148/95 (BP Location: Right Wrist)   Pulse 90   Temp 99.4 F (37.4 C) (Oral)   Resp 18   LMP 09/10/2019   SpO2 96%   Visual Acuity Right Eye Distance:   Left Eye Distance:   Bilateral Distance:    Right Eye Near:   Left Eye Near:    Bilateral Near:     Physical Exam Vitals and nursing note reviewed.  Constitutional:      General: She is not in acute distress.    Appearance: Normal appearance. She is not ill-appearing.  HENT:     Head: Atraumatic.     Right Ear: Tympanic membrane and external ear normal.     Left Ear: Tympanic membrane and external ear normal.     Nose: Rhinorrhea present.     Mouth/Throat:     Mouth: Mucous membranes are moist.     Pharynx: Posterior oropharyngeal erythema present.  Eyes:     Extraocular Movements: Extraocular movements intact.     Conjunctiva/sclera: Conjunctivae normal.  Cardiovascular:     Rate and Rhythm: Normal rate and regular rhythm.     Heart sounds: Normal heart sounds.  Pulmonary:     Effort: Pulmonary effort is normal. No respiratory distress.     Breath sounds: Normal breath sounds. No wheezing.  Abdominal:     General: Bowel sounds are normal. There is no distension.     Palpations: Abdomen is soft.     Tenderness: There is no abdominal tenderness. There is no right CVA tenderness or left CVA tenderness.  Musculoskeletal:        General: Normal range of motion.     Cervical back: Normal range of motion and neck supple.  Skin:    General: Skin is warm and dry.  Neurological:     Mental Status: She is alert and oriented to person, place, and time.  Psychiatric:        Mood and Affect: Mood normal.        Thought Content: Thought content normal.      UC Treatments / Results  Labs (all labs ordered are listed, but only abnormal results are displayed) Labs Reviewed  SARS CORONAVIRUS 2 (TAT 6-24 HRS)    EKG   Radiology No results found.  Procedures Procedures (including critical care time)  Medications Ordered in UC Medications - No data to display  Initial Impression / Assessment and Plan / UC Course  I have reviewed the triage vital signs and the nursing notes.  Pertinent labs & imaging results that were available during my care of the patient were reviewed by me and considered in my medical decision making (see chart for details).     Patient well appearing today and in no acute distress. Vital signs stable, exam fairly benign. Symptoms suspicious for COVID 19, particularly given in home exposure. Work note provided while awaiting test results. May return to work if positive 10 days after symptom onset and fever/symptom free for 48 hours. Discussed OTC medications, supportive care, and return precautions.   Final Clinical Impressions(s) / UC Diagnoses   Final diagnoses:  Exposure to COVID-19 virus  Generalized body  aches  Chills     Discharge Instructions     Continue use of over the counter medications for your symptoms, including naproxen and tylenol as needed as well as cold and flu medications. Drink lots of fluids, get lots of rest. Let us know if you begin to feel worse at any time.     ED Prescriptions    None     PDMP not reviewed this encounter.   Volney American, [place] 10/03/19 1202

## 2019-10-02 NOTE — ED Triage Notes (Signed)
Pt presents with loss of taste and smell, fever, headaches, body aches xs 3 days. States her nephew that lives with her tested positive for COVID on Friday.  States taking OTC medications with minimal relief.

## 2019-10-03 ENCOUNTER — Encounter: Payer: Self-pay | Admitting: Family Medicine

## 2019-10-03 ENCOUNTER — Other Ambulatory Visit: Payer: Self-pay | Admitting: Adult Health

## 2019-10-03 ENCOUNTER — Encounter: Payer: Self-pay | Admitting: Adult Health

## 2019-10-03 DIAGNOSIS — U071 COVID-19: Secondary | ICD-10-CM

## 2019-10-03 NOTE — Progress Notes (Signed)
I connected by phone with Lisa Cooley on 10/03/2019 at 9:38 AM to discuss the potential use of a new treatment for mild to moderate COVID-19 viral infection in non-hospitalized patients.  This patient is a 34 y.o. female that meets the FDA criteria for Emergency Use Authorization of COVID monoclonal antibody casirivimab/imdevimab.  Has a (+) direct SARS-CoV-2 viral test result  Has mild or moderate COVID-19   Is NOT hospitalized due to COVID-19  Is within 10 days of symptom onset  Has at least one of the high risk factor(s) for progression to severe COVID-19 and/or hospitalization as defined in EUA.  Specific high risk criteria : BMI > 25, Diabetes and Cardiovascular disease or hypertension   I have spoken and communicated the following to the patient or parent/caregiver regarding COVID monoclonal antibody treatment:  1. FDA has authorized the emergency use for the treatment of mild to moderate COVID-19 in adults and pediatric patients with positive results of direct SARS-CoV-2 viral testing who are 67 years of age and older weighing at least 40 kg, and who are at high risk for progressing to severe COVID-19 and/or hospitalization.  2. The significant known and potential risks and benefits of COVID monoclonal antibody, and the extent to which such potential risks and benefits are unknown.  3. Information on available alternative treatments and the risks and benefits of those alternatives, including clinical trials.  4. Patients treated with COVID monoclonal antibody should continue to self-isolate and use infection control measures (e.g., wear mask, isolate, social distance, avoid sharing personal items, clean and disinfect "high touch" surfaces, and frequent handwashing) according to CDC guidelines.   5. The patient or parent/caregiver has the option to accept or refuse COVID monoclonal antibody treatment.  After reviewing this information with the patient, The patient agreed  to proceed with receiving casirivimab\imdevimab infusion and will be provided a copy of the Fact sheet prior to receiving the infusion. Noreene Filbert 10/03/2019 9:38 AM

## 2019-10-04 ENCOUNTER — Ambulatory Visit (HOSPITAL_COMMUNITY)
Admission: RE | Admit: 2019-10-04 | Discharge: 2019-10-04 | Disposition: A | Discharge status: Home / Self Care | Payer: Medicaid Other | Source: Ambulatory Visit | Attending: Pulmonary Disease | Admitting: Pulmonary Disease

## 2019-10-04 DIAGNOSIS — U071 COVID-19: Secondary | ICD-10-CM | POA: Diagnosis present

## 2019-10-04 MED ORDER — ALBUTEROL SULFATE HFA 108 (90 BASE) MCG/ACT IN AERS: 2.0000 | INHALATION_SPRAY | Freq: Once | RESPIRATORY_TRACT | Status: DC | PRN

## 2019-10-04 MED ORDER — FAMOTIDINE IN NACL 20-0.9 MG/50ML-% IV SOLN: 20.0000 mg | Freq: Once | INTRAVENOUS | Status: DC | PRN

## 2019-10-04 MED ORDER — SODIUM CHLORIDE 0.9 % IV SOLN
1200.0000 mg | Freq: Once | INTRAVENOUS | Status: AC
  Administered 2019-10-04: 1200 mg via INTRAVENOUS

## 2019-10-04 MED ORDER — METHYLPREDNISOLONE SODIUM SUCC 125 MG IJ SOLR: 125.0000 mg | Freq: Once | INTRAMUSCULAR | Status: DC | PRN

## 2019-10-04 MED ORDER — SODIUM CHLORIDE 0.9 % IV SOLN: INTRAVENOUS | Status: DC | PRN

## 2019-10-04 MED ORDER — EPINEPHRINE 0.3 MG/0.3ML IJ SOAJ: 0.3000 mg | Freq: Once | INTRAMUSCULAR | Status: DC | PRN

## 2019-10-04 MED ORDER — DIPHENHYDRAMINE HCL 50 MG/ML IJ SOLN: 50.0000 mg | Freq: Once | INTRAMUSCULAR | Status: DC | PRN

## 2019-10-04 NOTE — Discharge Instructions (Signed)

## 2019-10-04 NOTE — Progress Notes (Signed)
  Diagnosis: COVID-19  Physician: Dr. Wright  Procedure: [facility] Infusion Clinic Med: casirivimab\imdevimab infusion - Provided patient with casirivimab\imdevimab fact sheet for patients, parents and caregivers prior to infusion.  Complications: No immediate complications noted.  Discharge: Discharged home   Clayson Riling R 10/04/2019  

## 2019-10-06 ENCOUNTER — Ambulatory Visit: Payer: Medicaid Other | Admitting: Licensed Clinical Social Worker

## 2019-10-06 ENCOUNTER — Telehealth: Payer: Medicaid Other

## 2019-10-06 DIAGNOSIS — Z139 Encounter for screening, unspecified: Secondary | ICD-10-CM

## 2019-10-06 DIAGNOSIS — F439 Reaction to severe stress, unspecified: Secondary | ICD-10-CM

## 2019-10-06 DIAGNOSIS — Z7189 Other specified counseling: Secondary | ICD-10-CM

## 2019-10-06 NOTE — Chronic Care Management (AMB) (Addendum)
Care Management   Clinical Social Work Follow Up   10/06/2019 Name: Lisa Cooley MRN: [id number] DOB: 02-19-1985 Referred by: Carollee Leitz, MD  Reason for referral : Care Coordination  Lisa Cooley is a 34 y.o. year old female who is a primary care patient of Carollee Leitz, MD.  Reason for follow-up: assess for barriers and progress with care plan goals .   Assessment: Patient continues to experience difficulty with getting assistance with rent, which seems to be increasing her stress. Barriers with access to a computer requires her to try to complete applications on her phone. Patient reports she is covid positive and unable to work in addition to her hours previously being reduced. Addendum: Collaborated with DSS was able to call patient to assist with application.Conference call with patient and DSS.  They assisted with taking information and call patient next week to complete the rent application. Plan:  1. Patient will work on application or go to DSS to complete 2.   LCSW will F/U in 1 to 2 weeks Advance Directive Status: ; not addressed during this encounter.  SDOH (Social Determinants of Health) assessments performed: Yes ;  SDOH Interventions     Most Recent Value  SDOH Interventions  Financial Strain Interventions Other (Comment)  Stress Interventions Provide Counseling      Goals Addressed            This Visit's Progress   . Financial strain       CARE PLAN ENTRY (see longitudinal plan of care for additional care plan information)  Current Barriers:  . Patient with HTN and DMII needs community resources to obtain her medication  . Patient acknowledges deficits and needs support, education and care coordination in order to meet this unmet need  . Due to limited income patient unable to pay bills and afford medication.   [name] team working with patient to explore options to afford her medication . Patient has not been successful with application  assistance for rent, Clinical Goal(s)  . Over the next 30 days patient will be able to get medication so that she can start to feel better  Interventions provided by LCSW:  . Assessment of needs and barriers to care as well as how impacting    . Provided patient with information phone numbers to F/U on referral for rent support via [place] with housing coalition and [employer] ( both have tried to contact her) . Assisted patient with application for rental assistance with [facility] . Motivational Interviewing, solution focused and emotional support provided Patient Self Care Activities & Deficits:  . Patient is unable to independently navigate community resource options without care coordination support  . Patient is motivated to resolve concern  . Will F/U with housing coalition, [facility] and complete application with [facility] Initial goal documentation and Please see past updates related to this goal by clicking on the "Past Updates" button in the selected goal       Outpatient Encounter Medications as of 10/06/2019  Medication Sig Note  . blood glucose meter kit and supplies KIT Dispense based on patient and insurance preference. Use up to four times daily as directed. (FOR ICD-9 250.00, 250.01).   [name] gabapentin (NEURONTIN) 300 MG capsule Take 1 capsule (300 mg total) by mouth 3 (three) times daily.   [name] glipiZIDE (GLUCOTROL) 5 MG tablet Take 1 tablet (5 mg total) by mouth daily before breakfast.   . glucose blood test strip Use as instructed   .  Lancet Devices MISC 1 Container by Does not apply route 2 (two) times daily as needed. As directed.   [name] lisinopril (ZESTRIL) 10 MG tablet Take 1 tablet (10 mg total) by mouth at bedtime.   . Meloxicam 15 MG TBDP Take by mouth once.   . metFORMIN (GLUCOPHAGE) 1000 MG tablet Take 1 tablet (1,000 mg total) by mouth 2 (two) times daily with a meal.   . naproxen (NAPROSYN) 500 MG tablet Take 1 tablet (500 mg total) by mouth 2 (two) times daily.   .  rosuvastatin (CRESTOR) 20 MG tablet Take 1 tablet (20 mg total) by mouth daily. (Patient not taking: Reported on 09/21/2019) 09/21/2019: She has not received it  . tiZANidine (ZANAFLEX) 4 MG tablet Take 1-2 tablets (4-8 mg total) by mouth every 6 (six) hours as needed for muscle spasms.   . [DISCONTINUED] albuterol (VENTOLIN HFA) 108 (90 Base) MCG/ACT inhaler Inhale 2 puffs into the lungs every 6 (six) hours as needed for wheezing or shortness of breath.   . [DISCONTINUED] DULoxetine (CYMBALTA) 60 MG capsule Take 1 capsule (60 mg total) by mouth daily.    No facility-administered encounter medications on file as of 10/06/2019.   Review of patient status, including review of consultants reports, relevant laboratory and other test results, and collaboration with appropriate care team members and the patient's provider was performed as part of comprehensive patient evaluation and provision of care management services.    Casimer Lanius, [place] / [place]   [phone] 9:48 AM

## 2019-10-11 ENCOUNTER — Other Ambulatory Visit: Payer: Self-pay | Admitting: Family Medicine

## 2019-10-12 ENCOUNTER — Ambulatory Visit: Payer: Medicaid Other | Admitting: Skilled Nursing Facility1

## 2019-10-13 ENCOUNTER — Telehealth: Payer: Self-pay | Admitting: Licensed Clinical Social Worker

## 2019-10-13 NOTE — Chronic Care Management (AMB) (Signed)
    Clinical Social Work  Care Management Outreach   10/13/2019 Name: Lisa Cooley MRN: [id number] DOB: Aug 11, 1985 Lisa Cooley is a 34 y.o. year old female who is a primary care patient of Dana Allan, MD .  The Care Management team was consulted for assistance with care coordination and resources.  F/U call to [name] today to assess needs and barriers with care plan. The outreach was unsuccessful.  A HIPPA compliant phone message was left for the patient providing contact information and requesting a return call.   Plan: LCSW will continue to collaborate with CCM RN and F/U with patient in 2 weeks  Review of patient status, including review of consultants reports, relevant laboratory and other test results, and collaboration with appropriate care team members and the patient's provider was performed as part of comprehensive patient evaluation and provision of care management services.    Sammuel Hines, LCSW Care Management & Coordination  [facility] / [facility]   [phone] 10:51 AM

## 2019-10-17 ENCOUNTER — Ambulatory Visit: Payer: Medicaid Other

## 2019-10-17 NOTE — Chronic Care Management (AMB) (Signed)
Care Management   Follow Up Note   10/17/2019 Name: Lisa Cooley MRN: [id number] DOB: 1985/03/23  Referred by: Carollee Leitz, MD Reason for referral : Appointment (DM II)   Lisa Cooley is a 34 y.o. year old female who is a primary care patient of Carollee Leitz, MD. The care management team was consulted for assistance with care management and care coordination needs.    Review of patient status, including review of consultants reports, relevant laboratory and other test results, and collaboration with appropriate care team members and the patient's provider was performed as part of comprehensive patient evaluation and provision of chronic care management services.    SDOH (Social Determinants of Health) assessments performed: No See Care Plan activities for detailed interventions related to [facility])     Advanced Directives: See Care Plan and Vynca application for related entries.   Goals Addressed              This Visit's Progress   .  I don't have the money for a meter to check my blood sugars (pt-stated)        CARE PLAN ENTRY (see longtitudinal plan of care for additional care plan information)  Objective:  Lab Results  Component Value Date   HGBA1C 8.0 (A) 08/30/2019 .   Lab Results  Component Value Date   CREATININE 0.71 01/28/2019   CREATININE 0.79 08/13/2018   CREATININE 0.71 05/10/2017 .   [name] No results found for: EGFR  Current Barriers:  [name] Knowledge Deficits related to basic Diabetes pathophysiology and self care/management . Difficulty obtaining or cannot afford medications . Does not have glucometer to monitor blood sugar . Financial Constraints  Case Manager Clinical Goal(s):  Over the next 30 days, patient will demonstrate improved adherence to prescribed treatment plan for diabetes self care/management as evidenced by: lowering her a1c by 1-2 points . daily monitoring and recording of CBG  . adherence to prescribed medication  regimen  Interventions:  . Provided education to patient about basic DM disease process . Reviewed medications with patient and discussed importance of medication adherence . Discussed plans with patient for ongoing care management follow up and provided patient with direct contact information for care management team . Will Provided patient with written educational materials in the mail about in a diabetes packet. . Discussed with the patient about cooking in her air fryer and using the canned vegetables that she has and not cooking them in the juice that they are canned but pour it out and wash them off and then cook them in fresh water. . Plan to get her a glucometer that is reasonable for her they Relion glucometer at [place] that are low cost and the supplies. Then checking in the morning , and also adding water in her diet  and eating more than once a day. [name] Spoke with the patient today and she states that she found out she had covid on 8/30.  She has basically had her mind on that and has not been checking her blood sugars.   . She has been taking her medications.  Staying hydrated,  trying to eat the best she can and practicing isolation. Encouraged her to check her sugars and write down her values.   . She said that she has been talking with different people about helping her pay her bills.  She is not sure who all of them are.  She states she will keep me updated on her progress. Patient Self Care  Activities:  . UNABLE to independently to self manage diabetes . Self administers oral medications as prescribed . Attends all scheduled provider appointments  Please see past updates related to this goal by clicking on the "Past Updates" button in the selected goal          The care management team will reach out to the patient again over the next 14 days.   Lazaro Arms RN, BSN, [facility] Care Management Coordinator [place] Phone: [phone] Fax:  [phone]

## 2019-10-17 NOTE — Patient Instructions (Signed)
Visit Information  Goals Addressed              This Visit's Progress   .  I don't have the money for a meter to check my blood sugars (pt-stated)        CARE PLAN ENTRY (see longtitudinal plan of care for additional care plan information)  Objective:  Lab Results  Component Value Date   HGBA1C 8.0 (A) 08/30/2019 .   Lab Results  Component Value Date   CREATININE 0.71 01/28/2019   CREATININE 0.79 08/13/2018   CREATININE 0.71 05/10/2017 .   [name] No results found for: EGFR  Current Barriers:  [name] Knowledge Deficits related to basic Diabetes pathophysiology and self care/management . Difficulty obtaining or cannot afford medications . Does not have glucometer to monitor blood sugar . Financial Constraints  Case Manager Clinical Goal(s):  Over the next 30 days, patient will demonstrate improved adherence to prescribed treatment plan for diabetes self care/management as evidenced by: lowering her a1c by 1-2 points . daily monitoring and recording of CBG  . adherence to prescribed medication regimen  Interventions:  . Provided education to patient about basic DM disease process . Reviewed medications with patient and discussed importance of medication adherence . Discussed plans with patient for ongoing care management follow up and provided patient with direct contact information for care management team . Will Provided patient with written educational materials in the mail about in a diabetes packet. . Discussed with the patient about cooking in her air fryer and using the canned vegetables that she has and not cooking them in the juice that they are canned but pour it out and wash them off and then cook them in fresh water. . Plan to get her a glucometer that is reasonable for her they Relion glucometer at [place] that are low cost and the supplies. Then checking in the morning , and also adding water in her diet  and eating more than once a day. [name] Spoke with the patient today and  she states that she found out she had covid on 8/30.  She has basically had her mind on that and has not been checking her blood sugars.   . She has been taking her medications.  Staying hydrated,  trying to eat the best she can and practicing isolation. Encouraged her to check her sugars and write down her values.   . She said that she has been talking with different people about helping her pay her bills.  She is not sure who all of them are.  She states she will keep me updated on her progress. Patient Self Care Activities:  . UNABLE to independently to self manage diabetes . Self administers oral medications as prescribed . Attends all scheduled provider appointments  Please see past updates related to this goal by clicking on the "Past Updates" button in the selected goal         Lisa Cooley was given information about Care Management services today including:  1. Care Management services include personalized support from designated clinical staff supervised by her physician, including individualized plan of care and coordination with other care providers 2. 24/7 contact phone numbers for assistance for urgent and routine care needs. 3. The patient may stop CCM services at any time (effective at the end of the month) by phone call to the office staff.  Patient agreed to services and verbal consent obtained.   The patient verbalized understanding of instructions provided today and declined  a print copy of patient instruction materials.   The care management team will reach out to the patient again over the next 14 days.   Lazaro Arms RN, BSN, [facility] Care Management Coordinator [place] Phone: [phone] Fax: [phone]

## 2019-10-27 ENCOUNTER — Telehealth: Payer: Self-pay | Admitting: Licensed Clinical Social Worker

## 2019-10-27 NOTE — Chronic Care Management (AMB) (Signed)
    Clinical Social Work  Care Management Outreach   10/27/2019 Name: Lisa Cooley MRN: [id number] DOB: [birth year]  Lisa Cooley is a 34 y.o. year old female who is a primary care patient of Lisa Allan, MD .  The Care Management team was consulted for assistance with [name] .   LCSW reached out to Lisa Cooley today by phone for ongoing assessment of needs and offer Care Management services.  The outreach was unsuccessful. A HIPPA compliant phone message was left for the patient providing contact information and requesting a return call.  Also reminded patient of phone appointment with CCM RN 10/31/19. Intervention: LCSW will wait for return call and continue to collaborate with CCM RN for patient's needs Plan:  Will F/U with patient as needed based on outreach with CCM RN  Review of patient status, including review of consultants reports, relevant laboratory and other test results, and collaboration with appropriate care team members and the patient's provider was performed as part of comprehensive patient evaluation and provision of care management services.    Lisa Hines, LCSW Care Management & Coordination  [facility] / [facility]   [phone] 11:13 AM

## 2019-10-31 ENCOUNTER — Ambulatory Visit: Payer: Medicaid Other

## 2019-10-31 NOTE — Patient Instructions (Signed)
Visit Information  Goals Addressed              This Visit's Progress   .  I don't have the money for a meter to check my blood sugars (pt-stated)        CARE PLAN ENTRY (see longtitudinal plan of care for additional care plan information)  Objective:  Lab Results  Component Value Date   HGBA1C 8.0 (A) 08/30/2019 .   Lab Results  Component Value Date   CREATININE 0.71 01/28/2019   CREATININE 0.79 08/13/2018   CREATININE 0.71 05/10/2017 .   . No results found for: EGFR  Current Barriers:  . Knowledge Deficits related to basic Diabetes pathophysiology and self care/management . Difficulty obtaining or cannot afford medications . Does not have glucometer to monitor blood sugar . Financial Constraints  Case Manager Clinical Goal(s):  Over the next 30 days, patient will demonstrate improved adherence to prescribed treatment plan for diabetes self care/management as evidenced by: lowering her a1c by 1-2 points . daily monitoring and recording of CBG  . adherence to prescribed medication regimen  Interventions:  . Provided education to patient about basic DM disease process . Reviewed medications with patient and discussed importance of medication adherence . Discussed plans with patient for ongoing care management follow up and provided patient with direct contact information for care management team . Will Provided patient with written educational materials in the mail about in a diabetes packet. . Discussed with the patient about cooking in her air fryer and using the canned vegetables that she has and not cooking them in the juice that they are canned but pour it out and wash them off and then cook them in fresh water. . Plan to get her a glucometer that is reasonable for her they Relion glucometer at [employer] that are low cost and the supplies. Then checking in the morning , and also adding water in her diet  and eating more than once a day. . Spoke with the patient today and  she is feeling strong from her bout with covid . She blood sugar and it was 113.  She is not checking regularly at this time.  Discussed why it is important to check blood sugars on a regular basis,  and diet. . Encouraged the patient to get a flu shot and also to have her eyes checked. . The patient states that she has still not received the educational information the  I have sent to her.  I will re mail the information.  She states that the mail system where she stays is not good. . She also asked about resources for having her gas cut on. I reminded her about speaking with LCSW Deborah Moore.  She did not remember at the time but I told her that I would contact her and she would give her a call. Patient Self Care Activities:  . UNABLE to independently to self manage diabetes . Self administers oral medications as prescribed . Attends all scheduled provider appointments  Please see past updates related to this goal by clicking on the "Past Updates" button in the selected goal         Ms. Sieling was given information about Care Management services today including:  1. Care Management services include personalized support from designated clinical staff supervised by her physician, including individualized plan of care and coordination with other care providers 2. 24/7 contact phone numbers for assistance for urgent and routine care needs. 3. The patient   may stop CCM services at any time (effective at the end of the month) by phone call to the office staff.  Patient agreed to services and verbal consent obtained.   The patient verbalized understanding of instructions provided today and declined a print copy of patient instruction materials.   The care management team will reach out to the patient again over the next 10 days.   Traci Coles RN, BSN, [facility] Care Management Coordinator [facility] Phone: [phone] Fax: [phone]   

## 2019-10-31 NOTE — Chronic Care Management (AMB) (Signed)
Care Management   Follow Up Note   10/31/2019 Name: Lisa Cooley MRN: [id number] DOB: April 23, 1985  Referred by: Carollee Leitz, MD Reason for referral : Appointment (DM II)   Lisa Cooley is a 34 y.o. year old female who is a primary care patient of Carollee Leitz, MD. The care management team was consulted for assistance with care management and care coordination needs.    Review of patient status, including review of consultants reports, relevant laboratory and other test results, and collaboration with appropriate care team members and the patient's provider was performed as part of comprehensive patient evaluation and provision of chronic care management services.    SDOH (Social Determinants of Health) assessments performed: No See Care Plan activities for detailed interventions related to [facility])     Advanced Directives: See Care Plan and Vynca application for related entries.   Goals Addressed              This Visit's Progress   .  I don't have the money for a meter to check my blood sugars (pt-stated)        CARE PLAN ENTRY (see longtitudinal plan of care for additional care plan information)  Objective:  Lab Results  Component Value Date   HGBA1C 8.0 (A) 08/30/2019 .   Lab Results  Component Value Date   CREATININE 0.71 01/28/2019   CREATININE 0.79 08/13/2018   CREATININE 0.71 05/10/2017 .   [name] No results found for: EGFR  Current Barriers:  [name] Knowledge Deficits related to basic Diabetes pathophysiology and self care/management . Difficulty obtaining or cannot afford medications . Does not have glucometer to monitor blood sugar . Financial Constraints  Case Manager Clinical Goal(s):  Over the next 30 days, patient will demonstrate improved adherence to prescribed treatment plan for diabetes self care/management as evidenced by: lowering her a1c by 1-2 points . daily monitoring and recording of CBG  . adherence to prescribed medication  regimen  Interventions:  . Provided education to patient about basic DM disease process . Reviewed medications with patient and discussed importance of medication adherence . Discussed plans with patient for ongoing care management follow up and provided patient with direct contact information for care management team . Will Provided patient with written educational materials in the mail about in a diabetes packet. . Discussed with the patient about cooking in her air fryer and using the canned vegetables that she has and not cooking them in the juice that they are canned but pour it out and wash them off and then cook them in fresh water. . Plan to get her a glucometer that is reasonable for her they Relion glucometer at [place] that are low cost and the supplies. Then checking in the morning , and also adding water in her diet  and eating more than once a day. [name] Spoke with the patient today and she is feeling strong from her bout with covid . She blood sugar and it was 113.  She is not checking regularly at this time.  Discussed why it is important to check blood sugars on a regular basis,  and diet. . Encouraged the patient to get a flu shot and also to have her eyes checked. . The patient states that she has still not received the educational information the  I have sent to her.  I will re mail the information.  She states that the mail system where she stays is not good. . She also asked about resources  for having her gas cut on. I reminded her about speaking with LCSW Casimer Lanius.  She did not remember at the time but I told her that I would contact her and she would give her a call. Patient Self Care Activities:  . UNABLE to independently to self manage diabetes . Self administers oral medications as prescribed . Attends all scheduled provider appointments  Please see past updates related to this goal by clicking on the "Past Updates" button in the selected goal          The care  management team will reach out to the patient again over the next 10 days.   Lazaro Arms RN, BSN, [facility] Care Management Coordinator [place] Phone: [phone] Fax: [phone]

## 2019-11-01 ENCOUNTER — Ambulatory Visit: Payer: Medicaid Other | Admitting: Licensed Clinical Social Worker

## 2019-11-01 DIAGNOSIS — Z7189 Other specified counseling: Secondary | ICD-10-CM

## 2019-11-01 DIAGNOSIS — Z719 Counseling, unspecified: Secondary | ICD-10-CM

## 2019-11-01 DIAGNOSIS — Z789 Other specified health status: Secondary | ICD-10-CM

## 2019-11-01 NOTE — Chronic Care Management (AMB) (Signed)
Care Management   Clinical Social Work Follow Up   11/01/2019 Name: Lisa Cooley MRN: [id number] DOB: [birth year] Referred by: Carollee Leitz, MD  Reason for referral : Care Coordination (F/U)  Lisa Cooley is a 34 y.o. year old female who is a primary care patient of Carollee Leitz, MD.  Reason for follow-up: assess for barriers and progress with care plan see below .    Assessment: Patient continues to experience cough from testing positive from COVID. Reports difficult to return to work with this cough. Patient is making progress towards goal with getting assistance with her bills. States she is unable to check blood pressure dure to not having a monitor . Plan:  1. LCSW will F/U with patient in 2 to 3 weeks Advance Directive Status: [place] for related entries.[name]  SDOH (Social Determinants of Health) assessments performed: No new needs identified   Goals Addressed            This Visit's Progress   . Advane directive       CARE PLAN ENTRY (see longitudinal plan of care for additional care plan information)  Current Barriers:  . Patient does not have an Forensic scientist . Patient acknowledges deficits, education and support in order to complete this document Clinical Social Work Goal(s): Over the next 30 to 60 days,  . the patient will review and complete Advance Directive packet, have notarized and provide a copy to provider office . review mailed EMMI education on Advance Directive as evidenced by patient self report of review Interventions provided by LCSW: . Assessed understanding of Advance Directives . A voluntary discussion about advanced care planning including importance of advanced directives, healthcare proxy and living will was discussed with the patient.  . Mailed the patient EMMI educational information on Advance Directives as well as an Forensic scientist packet Patient Self Care Activities:  . Is able to complete documentation  independently . Able to identify [place] . patient will review information mailed by LCSW Initial goal documentation    . Financial strain   On track    CARE PLAN ENTRY (see longitudinal plan of care for additional care plan information)  Current Barriers:  . Patient with HTN and DMII needs community resources to obtain her medication  . Patient acknowledges deficits and needs support, education and care coordination in order to meet this unmet need  . Due to limited income patient unable to pay bills and afford medication.   [name] Pharmacy team working with patient to explore options to afford her medication . Patient has received assistance with rent and utilities from DSS . Patient needs assistance with her gas deposit of $150.00 Clinical Goal(s)  . Over the next 30 days patient will be able to get medication so that she can start to feel better  Interventions provided by LCSW:  . Assessment of needs and barriers to care as well as how impacting    . Discussed options for gas deposit  . Motivational Interviewing, solution focused and emotional support provided Patient Self Care Activities & Deficits:  . Patient is unable to independently navigate community resource options without care coordination support  . Patient is motivated to resolve concern  . Will F/U with [facility] for assistance with gas deposit Initial goal documentation and Please see past updates related to this goal by clicking on the "Past Updates" button in the selected goal     . I  need a blood pressure cuff       CARE PLAN ENTRY (see longitudinal plan of care for additional care plan information)  Current Barriers:  . Patient with HTN needs blood pressure cuff to check and monitor pressure,  . Acknowledges deficits and needs support, education and care coordination in order to meet this unmet need  . Patient is out of work, does not have insurance and is unable to  purchase the item  Clinical Goal(s):  Over the next 30 days, patient will receive cuff and be able to check and monitor pressure  Interventions: . Assessed patient needs, what she has used in the past and how currently meeting needs . Collaborated with CCM RN regarding patient needs and request for blood pressure monitor . LCSW will explore all options to assist with meeting this need. Patient Self Care Activities: . Patient is unable to independently navigate getting DME without care coordination support  Initial goal documentation        Outpatient Encounter Medications as of 11/01/2019  Medication Sig Note  . blood glucose meter kit and supplies KIT Dispense based on patient and insurance preference. Use up to four times daily as directed. (FOR ICD-9 250.00, 250.01).   [name] gabapentin (NEURONTIN) 300 MG capsule Take 1 capsule (300 mg total) by mouth 3 (three) times daily.   [name] glipiZIDE (GLUCOTROL) 5 MG tablet Take 1 tablet (5 mg total) by mouth daily before breakfast.   . glucose blood test strip Use as instructed   . Lancet Devices MISC 1 Container by Does not apply route 2 (two) times daily as needed. As directed.   [name] lisinopril (ZESTRIL) 10 MG tablet TAKE 1 TABLET BY MOUTH AT BEDTIME   . Meloxicam 15 MG TBDP Take by mouth once.   . metFORMIN (GLUCOPHAGE) 1000 MG tablet Take 1 tablet (1,000 mg total) by mouth 2 (two) times daily with a meal.   . naproxen (NAPROSYN) 500 MG tablet Take 1 tablet (500 mg total) by mouth 2 (two) times daily.   . rosuvastatin (CRESTOR) 20 MG tablet Take 1 tablet (20 mg total) by mouth daily. (Patient not taking: Reported on 09/21/2019) 09/21/2019: She has not received it  . tiZANidine (ZANAFLEX) 4 MG tablet Take 1-2 tablets (4-8 mg total) by mouth every 6 (six) hours as needed for muscle spasms.   . [DISCONTINUED] albuterol (VENTOLIN HFA) 108 (90 Base) MCG/ACT inhaler Inhale 2 puffs into the lungs every 6 (six) hours as needed for wheezing or shortness of breath.     . [DISCONTINUED] DULoxetine (CYMBALTA) 60 MG capsule Take 1 capsule (60 mg total) by mouth daily.    No facility-administered encounter medications on file as of 11/01/2019.   Review of patient status, including review of consultants reports, relevant laboratory and other test results, and collaboration with appropriate care team members and the patient's provider was performed as part of comprehensive patient evaluation and provision of care management services.    Casimer Lanius, [place] [facility] [place]   [phone] 12:32 PM

## 2019-11-02 ENCOUNTER — Other Ambulatory Visit: Payer: Self-pay

## 2019-11-02 ENCOUNTER — Ambulatory Visit (INDEPENDENT_AMBULATORY_CARE_PROVIDER_SITE_OTHER): Payer: Self-pay | Admitting: Family Medicine

## 2019-11-02 VITALS — BP 132/82 | HR 100

## 2019-11-02 DIAGNOSIS — R05 Cough: Secondary | ICD-10-CM

## 2019-11-02 DIAGNOSIS — R058 Other specified cough: Secondary | ICD-10-CM

## 2019-11-02 MED ORDER — BENZONATATE 100 MG PO CAPS: 100.0000 mg | ORAL_CAPSULE | Freq: Two times a day (BID) | ORAL | 0 refills | Status: DC | PRN

## 2019-11-02 NOTE — Progress Notes (Signed)
    SUBJECTIVE:   CHIEF COMPLAINT / HPI:   Lisa Cooley is a 34 yo F who presents to CIDD clinic for the below issue.   Cough 10/02/19 was COVID positive continues to endorse cough. Has tried day/nyquil, flu, robitussin, and cough drops. Is disturbing sleep and causing throat pain. No recent sick contacts. Can taste and smell. No fevers, N/V/D. Intermittent heavy breathing/shortness of breath with coughing. Current marijuana smoker, 1 blunt daily.   PERTINENT  PMH / PSH: Taking albuterol (no formal respiratory diagnosis in problem list); HTN (Lisinopril 10 mg qhs), T2DM (Hgb A1c 8.0 08/30/19).  OBJECTIVE:   BP 132/82   Pulse 100   SpO2 97%   General: Appears well, no acute distress. Age appropriate. Cardiac: RRR, normal heart sounds, no murmurs Respiratory: CTAB, normal effort  ASSESSMENT/PLAN:   Post-viral cough syndrome Chronic cough from 1 month prior with association to recent viral illness. Otherwise well appearing. No increased work of breathing or wheezing and minimal use of inhaler. Patient is also a known daily marijuana smoker which could be exacerbating her symptoms. Although highly suggestive of post-viral cough syndrome will also consider post-viral pneumonia if cough worsens and become associated with sick symptoms such as fever, increase in oxygen requirement, change in mucus production. -Start tessalon for cough relief -Continue albuterol inhaler if needed -Counseled on smoking cessation -Consider CXR if cough continues or patient develops other sick symptoms -Follow up with PCP or sooner if symptoms worsen   Lisa Jumbo, DO [facility]

## 2019-11-02 NOTE — Patient Instructions (Addendum)
It was wonderful to see you today.  Please bring ALL of your medications with you to every visit.   Today we talked about:  Your continued cough after COVID. This a normal symptom to linger after a viral illness. I have prescribed tessalon as needed for cough.   Smoking cessation and utilizing your inhaler when needed for shortness of breath or wheezing will also be beneficial.   Please be sure to schedule follow up with PCP at the front desk before you leave today.   Please call the clinic at [phone] if your symptoms worsen or you have any concerns. It was our pleasure to serve you.  Dr. Salvadore Dom

## 2019-11-03 MED FILL — BENZONATATE 100 MG CAPS: 100 | 10 days supply | Qty: 20 | Fill #0

## 2019-11-06 DIAGNOSIS — R058 Other specified cough: Secondary | ICD-10-CM | POA: Insufficient documentation

## 2019-11-06 NOTE — Assessment & Plan Note (Addendum)
Chronic cough from 1 month prior with association to recent viral illness. Otherwise well appearing. No increased work of breathing or wheezing and minimal use of inhaler. Patient is also a known daily marijuana smoker which could be exacerbating her symptoms. Although highly suggestive of post-viral cough syndrome will also consider post-viral pneumonia if cough worsens and become associated with sick symptoms such as fever, increase in oxygen requirement, change in mucus production. Also consider medication associated cough d/t patient taking ACE-I.  -Start tessalon for cough relief -Continue albuterol inhaler if needed -Counseled on smoking cessation -Consider CXR if cough continues or patient develops other sick symptoms -Consider trial of discontinuing ACE-I -Follow up with PCP or sooner if symptoms worsen

## 2019-11-09 ENCOUNTER — Telehealth: Payer: Medicaid Other

## 2019-11-09 ENCOUNTER — Telehealth: Payer: Self-pay

## 2019-11-09 NOTE — Telephone Encounter (Signed)
  Care Management   Outreach Note  11/09/2019 Name: Lisa Cooley MRN: [id number] DOB: Sep 11, 1985  Referred by: Dana Allan, MD Reason for referral : Appointment (DM II)   An unsuccessful telephone outreach was attempted today. The patient was referred to the case management team for assistance with care management and care coordination.   Follow Up Plan: A HIPAA compliant phone message was left for the patient providing contact information and requesting a return call.  The care management team will reach out to the patient again over the next 7-14 days.   Juanell Fairly RN, BSN, [facility] Care Management Coordinator [facility] Phone: [phone] Fax: [phone]

## 2019-11-10 ENCOUNTER — Telehealth: Payer: Self-pay | Admitting: *Deleted

## 2019-11-10 NOTE — Chronic Care Management (AMB) (Signed)
  Care Management   Note  11/10/2019 Name: Lisa Cooley MRN: [id number] DOB: 1985/08/25  [name] is a 34 y.o. year old female who is a primary care patient of Dana Allan, MD and is actively engaged with the care management team. I reached out to [name] by phone today to assist with re-scheduling a follow up visit with the RN Case Manager.  Follow up plan: Unsuccessful telephone outreach attempt made. A HIPAA compliant phone message was left for the patient providing contact information and requesting a return call.  The care management team will reach out to the patient again over the next 7 days.  If patient returns call to provider office, please advise to call Embedded Care Management Care Guide Gwenevere Ghazi at [phone].  Gwenevere Ghazi  Care Guide, Embedded Care Coordination [facility] Management

## 2019-11-21 NOTE — Chronic Care Management (AMB) (Signed)
  Care Management   Note  11/21/2019 Name: Lisa Cooley MRN: [id number] DOB: Feb 02, 1986  Lisa Cooley is a 34 y.o. year old female who is a primary care patient of Dana Allan, MD and is actively engaged with the care management team. I reached out to [name] by phone today to assist with re-scheduling a follow up visit with the RN Case Manager.  Follow up plan: Unsuccessful telephone outreach attempt made. A HIPAA compliant phone message was left for the patient providing contact information and requesting a return call.  The care management team will reach out to the patient again over the next 7 days.  If patient returns call to provider office, please advise to call Embedded Care Management Care Guide Gwenevere Ghazi at [phone].  Gwenevere Ghazi  Care Guide, Embedded Care Coordination [facility] Management

## 2019-11-28 NOTE — Telephone Encounter (Signed)
Called spoke with patient rescheduled for 10/29 for follow call with RN CM.

## 2019-11-28 NOTE — Chronic Care Management (AMB) (Signed)
  Care Management   Note  11/28/2019 Name: Lisa Cooley MRN: [id number] DOB: 1985-10-26  [name] is a 34 y.o. year old female who is a primary care patient of Dana Allan, MD and is actively engaged with the care management team. I reached out to [name] by phone today to assist with re-scheduling a follow up visit with the RN Case Manager.  Follow up plan: Telephone appointment with care management team member scheduled for:12/01/2019  [facility] Guide, Embedded Care Coordination [facility] Management

## 2019-12-01 ENCOUNTER — Telehealth: Payer: Medicaid Other

## 2019-12-07 ENCOUNTER — Ambulatory Visit: Payer: Medicaid Other

## 2019-12-07 NOTE — Patient Instructions (Signed)
Visit Information  Goals Addressed              This Visit's Progress   .  I don't have the money for a meter to check my blood sugars (pt-stated)        CARE PLAN ENTRY (see longtitudinal plan of care for additional care plan information)  Objective:  Lab Results  Component Value Date   HGBA1C 8.0 (A) 08/30/2019 .   Lab Results  Component Value Date   CREATININE 0.71 01/28/2019   CREATININE 0.79 08/13/2018   CREATININE 0.71 05/10/2017 .   [name] No results found for: EGFR  Current Barriers:  [name] Knowledge Deficits related to basic Diabetes pathophysiology and self care/management . Difficulty obtaining or cannot afford medications . Does not have glucometer to monitor blood sugar . Financial Constraints  Case Manager Clinical Goal(s):  Over the next 30 days, patient will demonstrate improved adherence to prescribed treatment plan for diabetes self care/management as evidenced by: lowering her a1c by 1-2 points . daily monitoring and recording of CBG  . adherence to prescribed medication regimen  Interventions:  . Provided education to patient about basic DM disease process . Reviewed medications with patient and discussed importance of medication adherence . Discussed plans with patient for ongoing care management follow up and provided patient with direct contact information for care management team . Will Provided patient with written educational materials in the mail about in a diabetes packet. . Discussed with the patient about cooking in her air fryer and using the canned vegetables that she has and not cooking them in the juice that they are canned but pour it out and wash them off and then cook them in fresh water. . Plan to get her a glucometer that is reasonable for her they Relion glucometer at [place] that are low cost and the supplies. Then checking in the morning , and also adding water in her diet  and eating more than once a day. [name] Spoke with and she states that  she is feeling good .  She is not checking her blood sugars regularly.  She states that her blood sugars are have been high lately but unable to tell me why. She states that she has been under some stress lately trying to get her gas cut on.  She said everytime she gets the papers prepared that she needs for the program she is to late to get the assistance. . Discussed about diet and checking her blood sugars on a more consistent basis.  Also made sure that if she needs counseling she knows that she can reach out to [employer] who she has talked with before.  . Advised the patient that we have a blood pressure monitor at the office for her and made an appointment on 12/11/19 at 345 for her to come by and I will show her how to use it   Patient Self Care Activities:  . UNABLE to independently to self manage diabetes . Self administers oral medications as prescribed . Attends all scheduled provider appointments  Please see past updates related to this goal by clicking on the "Past Updates" button in the selected goal         Lisa Cooley was given information about Care Management services today including:  1. Care Management services include personalized support from designated clinical staff supervised by her physician, including individualized plan of care and coordination with other care providers 2. 24/7 contact phone numbers for assistance for  urgent and routine care needs. 3. The patient may stop CCM services at any time (effective at the end of the month) by phone call to the office staff.  Patient agreed to services and verbal consent obtained.   The patient verbalized understanding of instructions provided today and declined a print copy of patient instruction materials.   Plan: Telephone follow up with Lisa Cooley over the next 14 days   Lazaro Arms RN, BSN, [facility] Care Management Coordinator [place] Phone: [phone] Fax: [phone]

## 2019-12-07 NOTE — Chronic Care Management (AMB) (Signed)
RN  Care Management   Follow Up Note   12/07/2019 Name: Lisa Cooley MRN: [id number] DOB: [birth year]  Reason for referral : Chronic Care Management (HTN DM II)   Lisa Cooley is a 34 y.o. year old female who is a primary care patient of Carollee Leitz, MD. The care management team was consulted for assistance with care management and care coordination needs.    Subjective: " I am feeling much better"   Assessment: following up with the patient about her HTN and DM II.  The patient has not checked her HTN because she does not have a monitor.    Goals Addressed              This Visit's Progress   .  I don't have the money for a meter to check my blood sugars (pt-stated)        CARE PLAN ENTRY (see longtitudinal plan of care for additional care plan information)  Objective:  Lab Results  Component Value Date   HGBA1C 8.0 (A) 08/30/2019 .   Lab Results  Component Value Date   CREATININE 0.71 01/28/2019   CREATININE 0.79 08/13/2018   CREATININE 0.71 05/10/2017 .   [name] No results found for: EGFR  Current Barriers:  [name] Knowledge Deficits related to basic Diabetes pathophysiology and self care/management . Difficulty obtaining or cannot afford medications . Does not have glucometer to monitor blood sugar . Financial Constraints  Case Manager Clinical Goal(s):  Over the next 30 days, patient will demonstrate improved adherence to prescribed treatment plan for diabetes self care/management as evidenced by: lowering her a1c by 1-2 points . daily monitoring and recording of CBG  . adherence to prescribed medication regimen  Interventions:  . Provided education to patient about basic DM disease process . Reviewed medications with patient and discussed importance of medication adherence . Discussed plans with patient for ongoing care management follow up and provided patient with direct contact information for care management team . Will Provided patient  with written educational materials in the mail about in a diabetes packet. . Discussed with the patient about cooking in her air fryer and using the canned vegetables that she has and not cooking them in the juice that they are canned but pour it out and wash them off and then cook them in fresh water. . Plan to get her a glucometer that is reasonable for her they [employer] glucometer at [place] that are low cost and the supplies. Then checking in the morning , and also adding water in her diet  and eating more than once a day. [name] Spoke with and she states that she is feeling good .  She is not checking her blood sugars regularly.  She states that her blood sugars are have been high lately but unable to tell me why. She states that she has been under some stress lately trying to get her gas cut on.  She said everytime she gets the papers prepared that she needs for the program she is to late to get the assistance. . Discussed about diet and checking her blood sugars on a more consistent basis.  Also made sure that if she needs counseling she knows that she can reach out to [employer] who she has talked with before.  . Advised the patient that we have a blood pressure monitor at the office for her and made an appointment on 12/11/19 at 345 for her to come by and I will  show her how to use it   Patient Self Care Activities:  . UNABLE to independently to self manage diabetes . Self administers oral medications as prescribed . Attends all scheduled provider appointments  Please see past updates related to this goal by clicking on the "Past Updates" button in the selected goal         Review of patient status, including review of consultants reports, relevant laboratory and other test results, and collaboration with appropriate care team members and the patient's provider was performed as part of comprehensive patient evaluation and provision of chronic care management services.    SDOH (Social  Determinants of Health) assessments performed: No See Care Plan activities for detailed interventions related to SDOH)         Plan: Telephone follow up with Oletta Cohn over the next 14 days   [place], BSN, [facility] Care Management Coordinator [place] Phone: [phone] Fax: [phone]

## 2019-12-11 ENCOUNTER — Ambulatory Visit: Payer: Medicaid Other

## 2019-12-11 ENCOUNTER — Other Ambulatory Visit: Payer: Self-pay

## 2019-12-11 NOTE — Patient Instructions (Signed)
Visit Information  Goals Addressed            This Visit's Progress   . I need a blood pressure cuff       CARE PLAN ENTRY (see longitudinal plan of care for additional care plan information)  Current Barriers:  . Patient with HTN needs blood pressure cuff to check and monitor pressure,  . Acknowledges deficits and needs support, education and care coordination in order to meet this unmet need  . Patient is out of work, does not have insurance and is unable to purchase the item  Clinical Goal(s):  Over the next 30 days, patient will receive cuff and be able to check and monitor pressure  Interventions: . Assessed patient needs, what she has used in the past and how currently meeting needs . Collaborated with CCM RN regarding patient needs and request for blood pressure monitor . RNCM had patient to come in office for BP monitor. . Showed the patient how to use the monitor.  BP was 177/86.  Discussed proper way to check BP.  When are good times to check.  Use a log to be able to monitor BP patterns. Jovita Gamma the patient a book " A matter of choice BP Control " to look over and review. . Discussed how BP can negatively affect the body. . Discussed diet, smoking and exercise. . Discussed sign and symptoms of HTN.  Patient Self Care Activities: . Patient is unable to independently navigate getting DME without care coordination support  Please see past updates related to this goal by clicking on the "Past Updates" button in the selected goal          Ms. Laprade was given information about Care Management services today including:  1. Care Management services include personalized support from designated clinical staff supervised by her physician, including individualized plan of care and coordination with other care providers 2. 24/7 contact phone numbers for assistance for urgent and routine care needs. 3. The patient may stop CCM services at any time (effective at the end of the  month) by phone call to the office staff.  Patient agreed to services and verbal consent obtained.   The patient verbalized understanding of instructions provided today and declined a print copy of patient instruction materials.   The care management team will reach out to the patient again over the next 14 days.   Juanell Fairly RN, BSN, [facility] Care Management Coordinator [facility] Phone: [phone] Fax: [phone]

## 2019-12-11 NOTE — Chronic Care Management (AMB) (Signed)
  Care Management   Follow Up Note   12/11/2019 Name: Lisa Cooley MRN: [id number] DOB: Oct 30, 1985  Referred by: Dana Allan, MD Reason for referral : Appointment (HTN BP monitor)   [name] is a 34 y.o. year old female who is a primary care patient of Dana Allan, MD. The care management team was consulted for assistance with care management and care coordination needs.    Review of patient status, including review of consultants reports, relevant laboratory and other test results, and collaboration with appropriate care team members and the patient's provider was performed as part of comprehensive patient evaluation and provision of chronic care management services.    SDOH (Social Determinants of Health) assessments performed: No See Care Plan activities for detailed interventions related to [facility])     Advanced Directives: See Care Plan and Vynca application for related entries.   Goals Addressed            This Visit's Progress   . I need a blood pressure cuff       CARE PLAN ENTRY (see longitudinal plan of care for additional care plan information)  Current Barriers:  . Patient with HTN needs blood pressure cuff to check and monitor pressure,  . Acknowledges deficits and needs support, education and care coordination in order to meet this unmet need  . Patient is out of work, does not have insurance and is unable to purchase the item  Clinical Goal(s):  Over the next 30 days, patient will receive cuff and be able to check and monitor pressure  Interventions: . Assessed patient needs, what she has used in the past and how currently meeting needs . Collaborated with CCM RN regarding patient needs and request for blood pressure monitor . RNCM had patient to come in office for BP monitor. . Showed the patient how to use the monitor.  BP was 177/86.  Discussed proper way to check BP.  When are good times to check.  Use a log to be able to monitor BP  patterns. Jovita Gamma the patient a book " A matter of choice BP Control " to look over and review. . Discussed how BP can negatively affect the body. . Discussed diet, smoking and exercise. . Discussed sign and symptoms of HTN.  Patient Self Care Activities: . Patient is unable to independently navigate getting DME without care coordination support  Please see past updates related to this goal by clicking on the "Past Updates" button in the selected goal           The care management team will reach out to the patient again over the next 14 days.   Juanell Fairly RN, BSN, [facility] Care Management Coordinator [facility] Phone: [phone] Fax: [phone]

## 2019-12-25 ENCOUNTER — Encounter: Payer: Self-pay | Admitting: Family Medicine

## 2019-12-26 ENCOUNTER — Telehealth: Payer: Medicaid Other

## 2020-01-02 ENCOUNTER — Telehealth: Payer: Self-pay

## 2020-01-02 ENCOUNTER — Telehealth: Payer: Medicaid Other

## 2020-01-02 NOTE — Telephone Encounter (Signed)
  Care Management   Outreach Note  01/02/2020 Name: Lisa Cooley MRN: [id number] DOB: February 18, 1985  Referred by: Dana Allan, MD Reason for referral : Chronic Care Management (HTN DM II)   An unsuccessful telephone outreach was attempted today. The patient was referred to the case management team for assistance with care management and care coordination.   Follow Up Plan: A HIPAA compliant phone message was left for the patient providing contact information and requesting a return call.  The care management team will reach out to the patient again over the next 7-14 days.   Juanell Fairly RN, BSN, [facility] Care Management Coordinator [facility] Phone: [phone] Fax: [phone]

## 2020-01-03 NOTE — Telephone Encounter (Signed)
Traci called spoke with patient rescheduled for 01/17/2020.  Misty Stanley

## 2020-01-16 ENCOUNTER — Ambulatory Visit: Payer: Medicaid Other | Admitting: Family Medicine

## 2020-01-17 ENCOUNTER — Telehealth: Payer: Self-pay

## 2020-01-17 ENCOUNTER — Telehealth: Payer: Medicaid Other

## 2020-01-17 NOTE — Telephone Encounter (Signed)
  Care Management   Outreach Note  01/17/2020 Name: Reilyn Nelson MRN: [id number] DOB: 20-Nov-1985  Referred by: Dana Allan, MD Reason for referral : Chronic Care Management (HTN DM II)   [place] Jearlene Bridwell is enrolled in a Managed Medicaid Health Plan: No  A second unsuccessful telephone outreach was attempted today. The patient was referred to the case management team for assistance with care management and care coordination.   Follow Up Plan: A HIPAA compliant phone message was left for the patient providing contact information and requesting a return call.  The care management team will reach out to the patient again over the next 7-14 days.   Juanell Fairly RN, BSN, [facility] Care Management Coordinator [facility] Phone: [phone] I Fax: [phone]

## 2020-01-18 ENCOUNTER — Telehealth: Payer: Self-pay | Admitting: *Deleted

## 2020-01-18 NOTE — Chronic Care Management (AMB) (Signed)
°  Care Management   Note  01/18/2020 Name: Tavionna Grout MRN: [id number] DOB: 14-Apr-1985  Jone Baseman Kreft is a 34 y.o. year old female who is a primary care patient of Dana Allan, MD and is actively engaged with the care management team. I reached out to [name] by phone today to assist with re-scheduling a follow up visit with the RN Case Manager.  Follow up plan: Unsuccessful telephone outreach attempt made. The care management team will reach out to the patient again over the next 7 days. If patient returns call to provider office, please advise to call Embedded Care Management Care Guide Gwenevere Ghazi at [phone].  Gwenevere Ghazi  Care Guide, Embedded Care Coordination [facility] Management  Direct Dial: [phone]

## 2020-01-23 NOTE — Chronic Care Management (AMB) (Signed)
  Care Management   Note  01/23/2020 Name: Lisa Cooley MRN: [id number] DOB: [birth year]  Lisa Cooley is a 34 y.o. year old female who is a primary care patient of Dana Allan, MD and is actively engaged with the care management team. I reached out to [name] by phone today to assist with re-scheduling a follow up visit with the RN Case Manager.  Follow up plan: Unsuccessful telephone outreach attempt made. The care management team will reach out to the patient again over the next 7 days. If patient returns call to provider office, please advise to call Embedded Care Management Care Guide Gwenevere Ghazi at [phone].  Gwenevere Ghazi  Care Guide, Embedded Care Coordination [facility] Management  Direct Dial: [phone]

## 2020-01-29 ENCOUNTER — Inpatient Hospital Stay (HOSPITAL_COMMUNITY)
Admission: AD | Admit: 2020-01-29 | Discharge: 2020-01-29 | Disposition: A | Discharge status: Home / Self Care | Payer: Medicaid Other | Attending: Family Medicine | Admitting: Family Medicine

## 2020-01-29 DIAGNOSIS — R112 Nausea with vomiting, unspecified: Secondary | ICD-10-CM

## 2020-01-29 NOTE — MAU Provider Note (Signed)
S [name] is a 34 y.o. G0P0000 non-pregnant female who presents to MAU today with complaint of N/V/D. She does not have concern for being pregnant. LMP was 01/22/20. She went to UC before coming here but left because of 4hr wait.  ROS: +N/V/D No VB No abd pain  O Physical Exam Nursing note reviewed.  Constitutional:      Appearance: Normal appearance.  HENT:     Head: Normocephalic and atraumatic.  Pulmonary:     Effort: Pulmonary effort is normal. No respiratory distress.  Musculoskeletal:     Cervical back: Normal range of motion.  Neurological:     General: No focal deficit present.     Mental Status: She is alert and oriented to person, place, and time.  Psychiatric:        Mood and Affect: Mood normal.        Behavior: Behavior normal.    MDM: Since unlikely pregnancy related sx, recommend evaluation in [facility] or UC. Offered pt pregnancy test to r/o but declines and wants to discharge home. States she will return to [facility] or ED.    A 1. Nausea and vomiting, intractability of vomiting not specified, unspecified vomiting type     P Discharge from MAU in stable condition Warning signs for worsening condition that would warrant emergency follow-up discussed Patient may return to MAU as needed for pregnancy related complaints  Donette Larry, CNM 01/29/2020 1:55 PM

## 2020-01-30 NOTE — Telephone Encounter (Signed)
3 unsuccessful outreach attempts to reschedule call with [facility]

## 2020-01-30 NOTE — Chronic Care Management (AMB) (Signed)
  Care Management   Note  01/30/2020 Name: Lisa Cooley MRN: [id number] DOB: Jul 03, 1985  Lisa Cooley is a 34 y.o. year old female who is a primary care patient of Dana Allan, MD and is actively engaged with the care management team. I reached out to [name] by phone today to assist with re-scheduling a follow up visit with the RN Case Manager.  Follow up plan: A third unsuccessful telephone outreach attempt made. Unable to make contact on outreach attempts x 3. PCP Dana Allan, MD. notified via routed documentation in medical record.  We have been unable to make contact with the patient for follow up. The care management team is available to follow up with the patient after provider conversation with the patient regarding recommendation for care management engagement and subsequent re-referral to the care management team. If patient returns call to provider office, please advise to call Embedded Care Management Care Guide Gwenevere Ghazi at [phone].  Gwenevere Ghazi  Care Guide, Embedded Care Coordination [facility] Management  Direct Dial: [phone]

## 2020-02-15 ENCOUNTER — Encounter (HOSPITAL_COMMUNITY): Payer: Self-pay | Admitting: Emergency Medicine

## 2020-02-15 ENCOUNTER — Other Ambulatory Visit: Payer: Self-pay

## 2020-02-15 ENCOUNTER — Emergency Department (HOSPITAL_COMMUNITY)
Admission: EM | Admit: 2020-02-15 | Discharge: 2020-02-15 | Disposition: A | Discharge status: Home / Self Care | Payer: Medicaid Other | Attending: Emergency Medicine | Admitting: Emergency Medicine

## 2020-02-15 DIAGNOSIS — J069 Acute upper respiratory infection, unspecified: Secondary | ICD-10-CM | POA: Insufficient documentation

## 2020-02-15 DIAGNOSIS — Z87891 Personal history of nicotine dependence: Secondary | ICD-10-CM | POA: Insufficient documentation

## 2020-02-15 DIAGNOSIS — E1169 Type 2 diabetes mellitus with other specified complication: Secondary | ICD-10-CM | POA: Insufficient documentation

## 2020-02-15 DIAGNOSIS — Z711 Person with feared health complaint in whom no diagnosis is made: Secondary | ICD-10-CM

## 2020-02-15 DIAGNOSIS — Z79899 Other long term (current) drug therapy: Secondary | ICD-10-CM | POA: Insufficient documentation

## 2020-02-15 DIAGNOSIS — I10 Essential (primary) hypertension: Secondary | ICD-10-CM | POA: Insufficient documentation

## 2020-02-15 DIAGNOSIS — Z113 Encounter for screening for infections with a predominantly sexual mode of transmission: Secondary | ICD-10-CM | POA: Insufficient documentation

## 2020-02-15 DIAGNOSIS — Z7984 Long term (current) use of oral hypoglycemic drugs: Secondary | ICD-10-CM | POA: Insufficient documentation

## 2020-02-15 DIAGNOSIS — Z20822 Contact with and (suspected) exposure to covid-19: Secondary | ICD-10-CM | POA: Insufficient documentation

## 2020-02-15 LAB — CBG MONITORING, ED: Glucose-Capillary: 190 mg/dL — ABNORMAL HIGH (ref 70–99)

## 2020-02-15 LAB — RESP PANEL BY RT-PCR (FLU A&B, COVID) ARPGX2
Influenza A by PCR: NEGATIVE
Influenza B by PCR: NEGATIVE
SARS Coronavirus 2 by RT PCR: NEGATIVE

## 2020-02-15 MED ORDER — METHOCARBAMOL 500 MG PO TABS: 500.0000 mg | ORAL_TABLET | Freq: Two times a day (BID) | ORAL | 0 refills | Status: DC

## 2020-02-15 MED ORDER — NAPROXEN 500 MG PO TABS: 500.0000 mg | ORAL_TABLET | Freq: Two times a day (BID) | ORAL | 0 refills | Status: DC

## 2020-02-15 NOTE — ED Provider Notes (Signed)
[place] EMERGENCY DEPARTMENT Provider Note   CSN: [id number] Arrival date & time: 02/15/20  1200     History Chief Complaint  Patient presents with  . Generalized Body Aches    Lisa Cooley is a 35 y.o. female with past medical history significant for diabetes, headache and hypertension who presents for evaluation multiple complaints.  Patient states yesterday she developed myalgias, nonproductive cough.  She denies any chest pain or shortness of breath. No Unilateral leg swelling, redness or warmth.  He is concerned for COVID.  Patient would also like STD check while she is here.  Sexually active with 1 partner.  Uses protection.  No vaginal discharge, pain with intercourse, pruritus.  Denies any aggravating or alleviating factors.  History obtained from patient and past medical records.  No interpreter used.  HPI     Past Medical History:  Diagnosis Date  . Diabetes mellitus    Dx in [place] but not on meds  . Headache(784.0)   . Hypertension     Patient Active Problem List   Diagnosis Date Noted  . Post-viral cough syndrome 11/06/2019  . Hypertension associated with diabetes ([place]) 08/18/2018  . Healthcare maintenance 08/18/2018  . Screening for malignant neoplasm of cervix 12/20/2017  . Positive depression screening 12/20/2017  . Unprotected sex 09/17/2017  . 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 Screen for STD (sexually transmitted disease) 09/17/2017  . Diabetes mellitus type 2 in obese ([place]) 12/31/2016  . Epigastric pain 08/13/2011  . DM2 (diabetes mellitus, type 2) ([place]) 08/12/2011  . Morbid obesity ([place]) 08/12/2011  . Infertility, female 08/12/2011  . Irregular periods 08/12/2011    Past Surgical History:  Procedure Laterality Date  . NO PAST SURGERIES       OB History    Gravida  0   Para  0   Term  0   Preterm      AB  0   Living  0     SAB  0   IAB      Ectopic      Multiple      Live Births               Family History  Problem Relation Age of Onset  . Cancer Father   . Heart disease Sister   . Other Neg Hx     Social History   Tobacco Use  . Smoking status: Former Smoker    Packs/day: 0.25    Types: Cigarettes    Quit date: 04/20/2014    Years since quitting: 5.8  . Smokeless tobacco: Never Used  Substance Use Topics  . Alcohol use: No    Alcohol/week: 0.0 standard drinks  . Drug use: Yes    Types: Marijuana    Comment: last use 2 days ago    Home Medications Prior to Admission medications   Medication Sig Start Date End Date Taking? Authorizing Provider  methocarbamol (ROBAXIN) 500 MG tablet Take 1 tablet (500 mg total) by mouth 2 (two) times daily. 02/15/20  Yes Zandria Woldt A, PA-C  naproxen (NAPROSYN) 500 MG tablet Take 1 tablet (500 mg total) by mouth 2 (two) times daily. 02/15/20  Yes Salina Stanfield A, PA-C  benzonatate (TESSALON) 100 MG capsule Take 1 capsule (100 mg total) by mouth 2 (two) times daily as needed for cough. 11/02/19   Autry-Lott, Naaman Plummer, DO  blood glucose meter kit and supplies KIT Dispense based on patient and insurance preference. Use  up to four times daily as directed. (FOR ICD-9 250.00, 250.01). 09/01/19   Carollee Leitz, MD  gabapentin (NEURONTIN) 300 MG capsule Take 1 capsule (300 mg total) by mouth 3 (three) times daily. 09/19/19   Leavy Cella, RPH-CPP  glipiZIDE (GLUCOTROL) 5 MG tablet Take 1 tablet (5 mg total) by mouth daily before breakfast. 09/19/19   Leavy Cella, RPH-CPP  glucose blood test strip Use as instructed 09/19/19   Zenia Resides, MD  Lancet Devices MISC 1 Container by Does not apply route 2 (two) times daily as needed. As directed. 09/19/19   Zenia Resides, MD  lisinopril (ZESTRIL) 10 MG tablet TAKE 1 TABLET BY MOUTH AT BEDTIME 10/13/19   Carollee Leitz, MD  Meloxicam 15 MG TBDP Take by mouth once.    [provider]  metFORMIN (GLUCOPHAGE) 1000 MG tablet Take 1 tablet (1,000 mg total) by mouth 2  (two) times daily with a meal. 09/19/19   Leavy Cella, RPH-CPP  rosuvastatin (CRESTOR) 20 MG tablet Take 1 tablet (20 mg total) by mouth daily. Patient not taking: Reported on 09/21/2019 09/19/19   Leavy Cella, RPH-CPP  tiZANidine (ZANAFLEX) 4 MG tablet Take 1-2 tablets (4-8 mg total) by mouth every 6 (six) hours as needed for muscle spasms. 09/28/19   Carollee Leitz, MD  albuterol (VENTOLIN HFA) 108 (90 Base) MCG/ACT inhaler Inhale 2 puffs into the lungs every 6 (six) hours as needed for wheezing or shortness of breath. 08/30/19 09/13/19  Carollee Leitz, MD  DULoxetine (CYMBALTA) 60 MG capsule Take 1 capsule (60 mg total) by mouth daily. 12/20/17 09/13/19  Benay Pike, MD    Allergies    Patient has no known allergies.  Review of Systems   Review of Systems  Constitutional: Negative.   HENT: Negative.   Respiratory: Positive for cough.   Cardiovascular: Negative.   Gastrointestinal: Negative.   Genitourinary: Negative.   Musculoskeletal: Positive for myalgias.  Neurological: Negative.   All other systems reviewed and are negative.   Physical Exam Updated Vital Signs BP (!) 146/101 (BP Location: Right Arm)   Pulse 97   Temp 99.2 F (37.3 C) (Oral)   Resp 20   Ht 5' 7"  (1.702 m)   Wt (!) 140.6 kg   SpO2 100%   BMI 48.55 kg/m   Physical Exam Vitals and nursing note reviewed.  Constitutional:      General: She is not in acute distress.    Appearance: She is well-developed and well-nourished. She is not ill-appearing, toxic-appearing or diaphoretic.  HENT:     Head: Normocephalic and atraumatic.     Nose: Nose normal.     Mouth/Throat:     Mouth: Mucous membranes are moist.  Eyes:     Pupils: Pupils are equal, round, and reactive to light.  Cardiovascular:     Rate and Rhythm: Normal rate.     Pulses: Normal pulses and intact distal pulses.     Heart sounds: Normal heart sounds.  Pulmonary:     Effort: Pulmonary effort is normal. No respiratory distress.     Breath  sounds: Normal breath sounds.     Comments: Clear to auscultation bilateral without wheeze, rhonchi or rales.  Speaks in full sentences without difficulty Abdominal:     General: Bowel sounds are normal. There is no distension.     Palpations: There is no mass.     Tenderness: There is no abdominal tenderness. There is no right CVA tenderness, left CVA  tenderness, guarding or rebound.     Hernia: No hernia is present.  Genitourinary:    Comments: Pt to self swab Musculoskeletal:        General: No swelling or tenderness. Normal range of motion.     Cervical back: Normal range of motion.  Skin:    General: Skin is warm and dry.     Capillary Refill: Capillary refill takes less than 2 seconds.  Neurological:     General: No focal deficit present.     Mental Status: She is alert and oriented to person, place, and time.  Psychiatric:        Mood and Affect: Mood and affect normal.     ED Results / Procedures / Treatments   Labs (all labs ordered are listed, but only abnormal results are displayed) Labs Reviewed  CBG MONITORING, ED - Abnormal; Notable for the following components:      Result Value   Glucose-Capillary 190 (*)    All other components within normal limits  RESP PANEL BY RT-PCR (FLU A&B, COVID) ARPGX2  GC/CHLAMYDIA PROBE AMP ([place]) NOT AT [facility]    EKG None  Radiology No results found.  Procedures Procedures (including critical care time)  Medications Ordered in ED Medications - No data to display  ED Course  I have reviewed the triage vital signs and the nursing notes.  Pertinent labs & imaging results that were available during my care of the patient were reviewed by me and considered in my medical decision making (see chart for details).  35 year old presents for evaluation multiple complaints.  Afebrile, nonseptic, not ill-appearing.  Ambulatory without any hypoxia.  No respiratory distress.  Heart and lungs clear.  Abdomen soft, nontender.  No  clinical evidence of DVT on exam.  She is requesting COVID test.  COVID test here negative.  She is pretty adamant about EKG.  However low suspicion for acute ischemic changes.  Will get EKG given patient is perseverate about this. She also has concerns for STDs without any symptoms.  She will provide UA for GC and chlamydia.  Does not want HIV and syphilis testing.  She is currently asymptomatic.  Discussed with her she will be called when the testing results if anything is positive.  Encouraged outpatient follow-up for STD testing in the future.  EKG without ischemic changes.  Presents for upper respiratory symptoms.  She appears overall well.  Low suspicion for bacterial infection, PE, ACS, pneumothorax, myocarditis as cause of her symptoms.  Discussed symptomatic management at home.  She will return for any worsening symptoms.  Patient requesting x-ray for "allover body aches."  Discussed x-rays would not be useful in this situation.  Again stressed symptom management, rest, anti-inflammatories.    The patient has been appropriately medically screened and/or stabilized in the ED. I have low suspicion for any other emergent medical condition which would require further screening, evaluation or treatment in the ED or require inpatient management.  Patient is hemodynamically stable and in no acute distress.  Patient able to ambulate in department prior to ED.  Evaluation does not show acute pathology that would require ongoing or additional emergent interventions while in the emergency department or further inpatient treatment.  I have discussed the diagnosis with the patient and answered all questions.  Pain is been managed while in the emergency department and patient has no further complaints prior to discharge.  Patient is comfortable with plan discussed in room and is stable for discharge at this  time.  I have discussed strict return precautions for returning to the emergency department.  Patient was  encouraged to follow-up with PCP/specialist refer to at discharge.    MDM Rules/Calculators/A&P                          Lisa Cooley was evaluated in Emergency Department on 02/15/2020 for the symptoms described in the history of present illness. She was evaluated in the context of the global COVID-19 pandemic, which necessitated consideration that the patient might be at risk for infection with the SARS-CoV-2 virus that causes COVID-19. Institutional protocols and algorithms that pertain to the evaluation of patients at risk for COVID-19 are in a state of rapid change based on information released by regulatory bodies including the CDC and federal and state organizations. These policies and algorithms were followed during the patient's care in the ED. Final Clinical Impression(s) / ED Diagnoses Final diagnoses:  Viral URI with cough  Concern about STD in female without diagnosis    Rx / DC Orders ED Discharge Orders         Ordered    naproxen (NAPROSYN) 500 MG tablet  2 times daily        02/15/20 1539    methocarbamol (ROBAXIN) 500 MG tablet  2 times daily        02/15/20 1539           Srihari Shellhammer A, PA-C 02/15/20 1541    Elnora Morrison, MD 02/15/20 1626

## 2020-02-15 NOTE — ED Triage Notes (Signed)
Pt arrives to ED with c/o of body aches and mild dizziness x1 day. No sore throat, runny nose, or cough. States she wants a COVID test and STD check. States has no symtopms concerning for STD but still wants one due to partner.

## 2020-02-15 NOTE — Discharge Instructions (Addendum)
COVID test is negative.  Could certainly be additional viral infection.  Given your symptoms started yesterday I recommend if you are still having symptoms and 3 to 4 days being retested for COVID  Your urine was sent for STD testing.  This will result in approximately 2 days.  You will be called if anything is positive.  Would suggest following up with primary care provider  Return to the ED for any worsening symptoms.

## 2020-02-29 ENCOUNTER — Other Ambulatory Visit: Payer: Self-pay

## 2020-02-29 ENCOUNTER — Emergency Department (HOSPITAL_COMMUNITY)
Admission: EM | Admit: 2020-02-29 | Discharge: 2020-02-29 | Disposition: A | Discharge status: Home / Self Care | Payer: Self-pay | Attending: Emergency Medicine | Admitting: Emergency Medicine

## 2020-02-29 DIAGNOSIS — Z7984 Long term (current) use of oral hypoglycemic drugs: Secondary | ICD-10-CM | POA: Insufficient documentation

## 2020-02-29 DIAGNOSIS — I10 Essential (primary) hypertension: Secondary | ICD-10-CM | POA: Insufficient documentation

## 2020-02-29 DIAGNOSIS — E119 Type 2 diabetes mellitus without complications: Secondary | ICD-10-CM | POA: Insufficient documentation

## 2020-02-29 DIAGNOSIS — Z79899 Other long term (current) drug therapy: Secondary | ICD-10-CM | POA: Insufficient documentation

## 2020-02-29 DIAGNOSIS — Z711 Person with feared health complaint in whom no diagnosis is made: Secondary | ICD-10-CM

## 2020-02-29 DIAGNOSIS — N898 Other specified noninflammatory disorders of vagina: Secondary | ICD-10-CM | POA: Insufficient documentation

## 2020-02-29 DIAGNOSIS — Z87891 Personal history of nicotine dependence: Secondary | ICD-10-CM | POA: Insufficient documentation

## 2020-02-29 LAB — URINALYSIS, ROUTINE W REFLEX MICROSCOPIC
Bilirubin Urine: NEGATIVE
Glucose, UA: NEGATIVE mg/dL
Hgb urine dipstick: NEGATIVE
Ketones, ur: 20 mg/dL — AB
Nitrite: POSITIVE — AB
Protein, ur: 30 mg/dL — AB
Specific Gravity, Urine: 1.023 (ref 1.005–1.030)
WBC, UA: >50 WBC/hpf — ABNORMAL HIGH (ref 0–5)
pH: 5 (ref 5.0–8.0)

## 2020-02-29 LAB — PREGNANCY, URINE: Preg Test, Ur: NEGATIVE

## 2020-02-29 LAB — WET PREP, GENITAL
Clue Cells Wet Prep HPF POC: NONE SEEN
Sperm: NONE SEEN
Trich, Wet Prep: NONE SEEN
Yeast Wet Prep HPF POC: NONE SEEN

## 2020-02-29 MED ORDER — IBUPROFEN 800 MG PO TABS
800.0000 mg | ORAL_TABLET | Freq: Once | ORAL | Status: AC
  Administered 2020-02-29: 800 mg via ORAL
  Filled 2020-02-29: qty 1

## 2020-02-29 NOTE — Discharge Instructions (Signed)
Your STD results will show up in Mychart   Safe sex practices   See your doctor   Return to ER if you have worse discharge, fever, back pain

## 2020-02-29 NOTE — ED Triage Notes (Signed)
Pt reports being tested for several std's on 1/13 but not receiving any results. Still having vaginal irritation so wants further eval.

## 2020-02-29 NOTE — ED Notes (Signed)
Pt stating she was leaving; pt has a Hospital doctor up already; MD informed

## 2020-02-29 NOTE — ED Provider Notes (Signed)
[place] EMERGENCY DEPARTMENT Provider Note   CSN: [id number] Arrival date & time: 02/29/20  1700     History No chief complaint on file.    Lisa Cooley is a 35 y.o. female hx of DM, HTN, here with vaginal irritation. Patient is sexually active with one female partner.  She came in 2 weeks ago and had a negative Covid test.  She states that she had STD check but she did not find the results in my chart.  She was told by her partner to come in to get tested.  Patient has persistent irritation but denies any vaginal discharge.  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  Denies any pain that radiates down her legs.  Denies any numbness or weakness or trouble urinating.  The history is provided by the patient.       Past Medical History:  Diagnosis Date  . Diabetes mellitus    Dx in [place] but not on meds  . Headache(784.0)   . Hypertension     Patient Active Problem List   Diagnosis Date Noted  . Post-viral cough syndrome 11/06/2019  . Hypertension associated with diabetes ([place]) 08/18/2018  . Healthcare maintenance 08/18/2018  . Screening for malignant neoplasm of cervix 12/20/2017  . Positive depression screening 12/20/2017  . Unprotected sex 09/17/2017  . 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 Screen for STD (sexually transmitted disease) 09/17/2017  . Diabetes mellitus type 2 in obese ([place]) 12/31/2016  . Epigastric pain 08/13/2011  . DM2 (diabetes mellitus, type 2) ([place]) 08/12/2011  . Morbid obesity ([place]) 08/12/2011  . Infertility, female 08/12/2011  . Irregular periods 08/12/2011    Past Surgical History:  Procedure Laterality Date  . NO PAST SURGERIES       OB History    Gravida  0   Para  0   Term  0   Preterm      AB  0   Living  0     SAB  0   IAB      Ectopic      Multiple      Live Births              Family History  Problem Relation Age of  Onset  . Cancer Father   . Heart disease Sister   . Other Neg Hx     Social History   Tobacco Use  . Smoking status: Former Smoker    Packs/day: 0.25    Types: Cigarettes    Quit date: 04/20/2014    Years since quitting: 5.8  . Smokeless tobacco: Never Used  Substance Use Topics  . Alcohol use: No    Alcohol/week: 0.0 standard drinks  . Drug use: Yes    Types: Marijuana    Comment: last use 2 days ago    Home Medications Prior to Admission medications   Medication Sig Start Date End Date Taking? Authorizing Provider  benzonatate (TESSALON) 100 MG capsule Take 1 capsule (100 mg total) by mouth 2 (two) times daily as needed for cough. 11/02/19   Autry-Lott, Naaman Plummer, DO  blood glucose meter kit and supplies KIT Dispense based on patient and insurance preference. Use up to four times daily as directed. (FOR ICD-9 250.00, 250.01). 09/01/19   Carollee Leitz, MD  gabapentin (NEURONTIN) 300 MG capsule Take 1 capsule (300 mg total) by mouth 3 (three) times daily. 09/19/19  Leavy Cella, RPH-CPP  glipiZIDE (GLUCOTROL) 5 MG tablet Take 1 tablet (5 mg total) by mouth daily before breakfast. 09/19/19   Leavy Cella, RPH-CPP  glucose blood test strip Use as instructed 09/19/19   Zenia Resides, MD  Lancet Devices MISC 1 Container by Does not apply route 2 (two) times daily as needed. As directed. 09/19/19   Zenia Resides, MD  lisinopril (ZESTRIL) 10 MG tablet TAKE 1 TABLET BY MOUTH AT BEDTIME 10/13/19   Carollee Leitz, MD  Meloxicam 15 MG TBDP Take by mouth once.    [provider]  metFORMIN (GLUCOPHAGE) 1000 MG tablet Take 1 tablet (1,000 mg total) by mouth 2 (two) times daily with a meal. 09/19/19   Leavy Cella, RPH-CPP  methocarbamol (ROBAXIN) 500 MG tablet Take 1 tablet (500 mg total) by mouth 2 (two) times daily. 02/15/20   Henderly, Britni A, PA-C  naproxen (NAPROSYN) 500 MG tablet Take 1 tablet (500 mg total) by mouth 2 (two) times daily. 02/15/20   Henderly, Britni A, PA-C   rosuvastatin (CRESTOR) 20 MG tablet Take 1 tablet (20 mg total) by mouth daily. Patient not taking: Reported on 09/21/2019 09/19/19   Leavy Cella, RPH-CPP  tiZANidine (ZANAFLEX) 4 MG tablet Take 1-2 tablets (4-8 mg total) by mouth every 6 (six) hours as needed for muscle spasms. 09/28/19   Carollee Leitz, MD  albuterol (VENTOLIN HFA) 108 (90 Base) MCG/ACT inhaler Inhale 2 puffs into the lungs every 6 (six) hours as needed for wheezing or shortness of breath. 08/30/19 09/13/19  Carollee Leitz, MD  DULoxetine (CYMBALTA) 60 MG capsule Take 1 capsule (60 mg total) by mouth daily. 12/20/17 09/13/19  Benay Pike, MD    Allergies    Patient has no known allergies.  Review of Systems   Review of Systems  Genitourinary: Positive for vaginal pain.  All other systems reviewed and are negative.   Physical Exam Updated Vital Signs BP (!) 147/109   Pulse 98   Temp 99.6 F (37.6 C) (Oral)   Resp 18   Ht $R'5\' 7"'zM$  (1.702 m)   Wt (!) 140.6 kg   SpO2 99%   BMI 48.55 kg/m   Physical Exam Vitals and nursing note reviewed.  Constitutional:      Appearance: Normal appearance.  HENT:     Head: Normocephalic.     Nose: Nose normal.     Mouth/Throat:     Mouth: Mucous membranes are moist.  Eyes:     Extraocular Movements: Extraocular movements intact.     Pupils: Pupils are equal, round, and reactive to light.  Cardiovascular:     Rate and Rhythm: Normal rate and regular rhythm.     Pulses: Normal pulses.     Heart sounds: Normal heart sounds.  Pulmonary:     Effort: Pulmonary effort is normal.     Breath sounds: Normal breath sounds.  Abdominal:     General: Abdomen is flat.     Palpations: Abdomen is soft.  Genitourinary:    Comments: Patient wants to hold off on it for now Musculoskeletal:     Cervical back: Normal range of motion.     Comments: Mild lower lumbar tenderness.  No obvious deformity, no saddle anesthesia  Skin:    General: Skin is warm.     Capillary Refill: Capillary  refill takes less than 2 seconds.  Neurological:     General: No focal deficit present.     Mental Status: She  is alert and oriented to person, place, and time.  Psychiatric:        Mood and Affect: Mood normal.        Behavior: Behavior normal.     ED Results / Procedures / Treatments   Labs (all labs ordered are listed, but only abnormal results are displayed) Labs Reviewed  WET PREP, GENITAL - Abnormal; Notable for the following components:      Result Value   WBC, Wet Prep HPF POC FEW (*)    All other components within normal limits  URINE CULTURE  URINALYSIS, ROUTINE W REFLEX MICROSCOPIC  PREGNANCY, URINE  GC/CHLAMYDIA PROBE AMP ([place]) NOT AT [facility]    EKG None  Radiology No results found.  Procedures Procedures  Medications Ordered in ED Medications  ibuprofen (ADVIL) tablet 800 mg (800 mg Oral Given 02/29/20 1934)    ED Course  I have reviewed the triage vital signs and the nursing notes.  Pertinent labs & imaging results that were available during my care of the patient were reviewed by me and considered in my medical decision making (see chart for details).    MDM Rules/Calculators/A&P                         Lisa Cooley is a 35 y.o. female clear with persistent vaginal irritation.  Patient had GC chlamydia sent last time but there is no results in the chart. We will have her self swab for wet prep and GC chlamydia again. Patient declined treatment for STDs.  Patient also has chronic back pain but has no neuro deficits  8:20 PM Wet prep unremarkable. GC/chlamydia pending. Patient will check her results in mychart.    Final Clinical Impression(s) / ED Diagnoses Final diagnoses:  None    Rx / DC Orders ED Discharge Orders    None       Drenda Freeze, MD 02/29/20 2022

## 2020-03-01 LAB — GC/CHLAMYDIA PROBE AMP (~~LOC~~) NOT AT ARMC
Chlamydia: NEGATIVE
Comment: NEGATIVE
Comment: NORMAL
Neisseria Gonorrhea: NEGATIVE

## 2020-03-03 LAB — URINE CULTURE: Culture: >=100000 — AB

## 2020-03-04 NOTE — Progress Notes (Signed)
ED Antimicrobial Stewardship Positive Culture Follow Up   Lisa Cooley is an 35 y.o. female who presented to [facility] on 02/29/2020 with a chief complaint of No chief complaint on file.   Recent Results (from the past 720 hour(s))  Resp Panel by RT-PCR (Flu A&B, Covid) Nasopharyngeal Swab     Status: None   Collection Time: 02/15/20 12:22 PM   Specimen: Nasopharyngeal Swab; Nasopharyngeal(NP) swabs in vial transport medium  Result Value Ref Range Status   SARS Coronavirus 2 by RT PCR NEGATIVE NEGATIVE Final    Comment: (NOTE) SARS-CoV-2 target nucleic acids are NOT DETECTED.  The SARS-CoV-2 RNA is generally detectable in upper respiratory specimens during the acute phase of infection. The lowest concentration of SARS-CoV-2 viral copies this assay can detect is 138 copies/mL. A negative result does not preclude SARS-Cov-2 infection and should not be used as the sole basis for treatment or other patient management decisions. A negative result may occur with  improper specimen collection/handling, submission of specimen other than nasopharyngeal swab, presence of viral mutation(s) within the areas targeted by this assay, and inadequate number of viral copies(<138 copies/mL). A negative result must be combined with clinical observations, patient history, and epidemiological information. The expected result is Negative.  Fact Sheet for Patients:  BloggerCourse.com  Fact Sheet for Healthcare Providers:  SeriousBroker.it  This test is no t yet approved or cleared by the [place] FDA and  has been authorized for detection and/or diagnosis of SARS-CoV-2 by FDA under an Emergency Use Authorization (EUA). This EUA will remain  in effect (meaning this test can be used) for the duration of the COVID-19 declaration under Section 564(b)(1) of the Act, 21 U.S.C.section 360bbb-3(b)(1), unless the authorization is terminated  or  revoked sooner.       Influenza A by PCR NEGATIVE NEGATIVE Final   Influenza B by PCR NEGATIVE NEGATIVE Final    Comment: (NOTE) The Xpert Xpress SARS-CoV-2/FLU/RSV plus assay is intended as an aid in the diagnosis of influenza from Nasopharyngeal swab specimens and should not be used as a sole basis for treatment. Nasal washings and aspirates are unacceptable for Xpert Xpress SARS-CoV-2/FLU/RSV testing.  Fact Sheet for Patients: BloggerCourse.com  Fact Sheet for Healthcare Providers: SeriousBroker.it  This test is not yet approved or cleared by the [place] FDA and has been authorized for detection and/or diagnosis of SARS-CoV-2 by FDA under an Emergency Use Authorization (EUA). This EUA will remain in effect (meaning this test can be used) for the duration of the COVID-19 declaration under Section 564(b)(1) of the Act, 21 U.S.C. section 360bbb-3(b)(1), unless the authorization is terminated or revoked.  Performed at [facility], [address], [place], [place] [address]   Urine Culture     Status: Abnormal   Collection Time: 02/29/20  7:17 PM   Specimen: Urine, Random  Result Value Ref Range Status   Specimen Description URINE, RANDOM  Final   Special Requests   Final    NONE Performed at [facility], [address], [place], [place] [address]    Culture >=100,000 COLONIES/mL ESCHERICHIA COLI (A)  Final   Report Status 03/03/2020 FINAL  Final   Organism ID, Bacteria ESCHERICHIA COLI (A)  Final      Susceptibility   Escherichia coli - MIC*    AMPICILLIN 4 SENSITIVE Sensitive     CEFAZOLIN <=4 SENSITIVE Sensitive     CEFEPIME <=0.12 SENSITIVE Sensitive     CEFTRIAXONE <=0.25 SENSITIVE Sensitive  CIPROFLOXACIN >=4 RESISTANT Resistant     GENTAMICIN <=1 SENSITIVE Sensitive     IMIPENEM <=0.25 SENSITIVE Sensitive     NITROFURANTOIN <=16 SENSITIVE Sensitive     TRIMETH/SULFA <=20 SENSITIVE  Sensitive     AMPICILLIN/SULBACTAM <=2 SENSITIVE Sensitive     PIP/TAZO <=4 SENSITIVE Sensitive     * >=100,000 COLONIES/mL ESCHERICHIA COLI  Wet prep, genital     Status: Abnormal   Collection Time: 02/29/20  7:31 PM   Specimen: Vaginal  Result Value Ref Range Status   Yeast Wet Prep HPF POC NONE SEEN NONE SEEN Final   Trich, Wet Prep NONE SEEN NONE SEEN Final   Clue Cells Wet Prep HPF POC NONE SEEN NONE SEEN Final   WBC, Wet Prep HPF POC FEW (A) NONE SEEN Final    Comment: Specimen diluted due to transport tube containing more than 1 ml of saline, interpret results with caution.   Sperm NONE SEEN  Final    Comment: Performed at [facility], [address] [place], [place] [address]    [x]  Patient discharged originally without antimicrobial agent and treatment is now indicated  New antibiotic prescription: Start cephalexin 500mg  PO q6h x 5 days.  ED Provider: , MD   [place] 03/04/2020, 8:07 AM Clinical Pharmacist Monday - Friday phone -  [phone] Saturday - Sunday phone - [phone]

## 2020-03-24 ENCOUNTER — Encounter: Payer: Self-pay | Admitting: Family Medicine

## 2020-03-24 NOTE — Progress Notes (Deleted)
    SUBJECTIVE:   CHIEF COMPLAINT / HPI:   Cough and body ache:   PERTINENT  PMH / PSH: ***  OBJECTIVE:   There were no vitals taken for this visit.  ***  ASSESSMENT/PLAN:   No problem-specific Assessment & Plan notes found for this encounter.     Sandre Kitty, MD [facility]

## 2020-03-25 ENCOUNTER — Ambulatory Visit: Payer: Self-pay
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
## 2020-03-26 ENCOUNTER — Other Ambulatory Visit: Payer: Self-pay

## 2020-03-26 ENCOUNTER — Ambulatory Visit (INDEPENDENT_AMBULATORY_CARE_PROVIDER_SITE_OTHER): Payer: Self-pay | Admitting: Family Medicine

## 2020-03-26 ENCOUNTER — Encounter: Payer: Self-pay | Admitting: Family Medicine

## 2020-03-26 VITALS — BP 152/84 | HR 101 | Ht 67.0 in

## 2020-03-26 DIAGNOSIS — M791 Myalgia, unspecified site: Secondary | ICD-10-CM

## 2020-03-26 DIAGNOSIS — I152 Hypertension secondary to endocrine disorders: Secondary | ICD-10-CM

## 2020-03-26 DIAGNOSIS — E1159 Type 2 diabetes mellitus with other circulatory complications: Secondary | ICD-10-CM

## 2020-03-26 MED ORDER — DULOXETINE HCL 30 MG PO CPEP: 30.0000 mg | ORAL_CAPSULE | Freq: Every day | ORAL | 0 refills | Status: DC

## 2020-03-26 MED ORDER — LISINOPRIL 20 MG PO TABS
20.0000 mg | ORAL_TABLET | Freq: Every day | ORAL | 0 refills | Status: DC
Start: 2020-03-26 — End: 2020-07-25

## 2020-03-26 NOTE — Patient Instructions (Addendum)
We will collect lab work to assess for electrolytes and muscle break down. I will notify you of abnormal labs results.    Muscle Pain, Adult Muscle pain, also called myalgia, is a condition in which a person has pain in one or more muscles in the body. Muscle pain may be mild, moderate, or severe. It may feel sharp, achy, or burning. In most cases, the pain lasts only a short time and goes away without treatment. Muscle pain can result from using muscles in a new or different way or after a period of inactivity. It is normal to feel some muscle pain after starting an exercise program. Muscles that have not been used often will be sore at first. What are the causes? This condition is caused by using muscles in a new or different way after a period of inactivity. Other causes may include:  Overuse or muscle strain, especially if you are not in shape. This is the most common cause of muscle pain.  Injury or bruising.  Infectious diseases, including diseases caused by viruses, such as the flu (influenza).  Fibromyalgia.This is a long-term, or chronic, condition that causes muscle tenderness, tiredness (fatigue), and headache.  Autoimmune or rheumatologic diseases. These are conditions, such as lupus, in which the body's defense system (immunesystem) attacks areas in the body.  Certain medicines, including ACE inhibitors and statins. What are the signs or symptoms? The main symptom of this condition is sore or painful muscles, including during activity and when stretching. You may also have slight swelling. How is this diagnosed? This condition is diagnosed with a physical exam. Your health care provider will ask questions about your pain and when it began. If you have not had muscle pain for very long, your health care provider may want to wait before doing much testing. If your muscle pain has lasted a long time, tests may be done right away. In some cases, this may include tests to rule out  certain conditions or illnesses. How is this treated? Treatment for this condition depends on the cause. Home care is often enough to relieve muscle pain. Your health care provider may also prescribe NSAIDs, such as ibuprofen. Follow these instructions at home: Medicines  Take over-the-counter and prescription medicines only as told by your health care provider.  Ask your health care provider if the medicine prescribed to you requires you to avoid driving or using machinery. Managing pain, swelling, and discomfort  If directed, put ice on the painful area. To do this: ? Put ice in a plastic bag. ? Place a towel between your skin and the bag. ? Leave the ice on for 20 minutes, 2-3 times a day.  For the first 2 days of muscle soreness, or if there is swelling: ? Do not soak in hot baths. ? Do not use a hot tub, steam room, sauna, heating pad, or other heat source.  After 48-72 hours, you may alternate between applying ice and applying heat as told by your health care provider. If directed, apply heat to the affected area as often as told by your health care provider. Use the heat source that your health care provider recommends, such as a moist heat pack or a heating pad. ? Place a towel between your skin and the heat source. ? Leave the heat on for 20-30 minutes. ? Remove the heat if your skin turns bright red. This is especially important if you are unable to feel pain, heat, or cold. You may have a  greater risk of getting burned.  If you have an injury, raise (elevate) the injured area above the level of your heart while you are sitting or lying down.      Activity  If overuse is causing your muscle pain: ? Slow down your activities until the pain goes away. ? Do regular, gentle exercises if you are not usually active. ? Warm up before exercising. Stretch before and after exercising. This can help lower the risk of muscle pain.  Do not continue working out if the pain is severe.  Severe pain could mean that you have injured a muscle.  Do not lift anything that is heavier than 5-10 lb (2.3-4.5 kg), or the limit that you are told, until your health care provider says that it is safe.  Return to your normal activities as told by your health care provider. Ask your health care provider what activities are safe for you.   General instructions  Do not use any products that contain nicotine or tobacco, such as cigarettes, e-cigarettes, and chewing tobacco. These can delay healing. If you need help quitting, ask your health care provider.  Keep all follow-up visits as told by your health care provider. This is important. Contact a health care provider if you have:  Muscle pain that gets worse and medicines do not help.  Muscle pain that lasts longer than 3 days.  A rash or fever along with muscle pain.  Muscle pain after a tick bite.  Muscle pain while working out, even though you are in good physical condition.  Redness, soreness, or swelling along with muscle pain.  Muscle pain after starting a new medicine or changing the dose of a medicine. Get help right away if you have:  Trouble breathing.  Trouble swallowing.  Muscle pain along with a stiff neck, fever, and vomiting.  Severe muscle weakness or you cannot move part of your body. These symptoms may represent a serious problem that is an emergency. Do not wait to see if the symptoms will go away. Get medical help right away. Call your local emergency services (911 in the [place] Do not drive yourself to the hospital. Summary  Muscle pain usually lasts only a short time and goes away without treatment.  This condition is caused by using muscles in a new or different way after a period of inactivity.  If your muscle pain lasts longer than 3 days, tell your health care provider. This information is not intended to replace advice given to you by your health care provider. Make sure you discuss any questions  you have with your health care provider. Document Revised: 10/28/2018 Document Reviewed: 10/28/2018 Elsevier Patient Education  2021 [employer].

## 2020-03-27 LAB — COMPREHENSIVE METABOLIC PANEL
ALT: 9 IU/L (ref 0–32)
AST: 9 IU/L (ref 0–40)
Albumin/Globulin Ratio: 1.4 (ref 1.2–2.2)
Albumin: 4 g/dL (ref 3.8–4.8)
Alkaline Phosphatase: 57 IU/L (ref 44–121)
BUN/Creatinine Ratio: 15 (ref 9–23)
BUN: 11 mg/dL (ref 6–20)
Bilirubin Total: <0.2 mg/dL (ref 0.0–1.2)
CO2: 24 mmol/L (ref 20–29)
Calcium: 9.4 mg/dL (ref 8.7–10.2)
Chloride: 101 mmol/L (ref 96–106)
Creatinine, Ser: 0.73 mg/dL (ref 0.57–1.00)
GFR calc Af Amer: 125 mL/min/{1.73_m2} (ref >59)
GFR calc non Af Amer: 109 mL/min/{1.73_m2} (ref >59)
Globulin, Total: 2.9 g/dL (ref 1.5–4.5)
Glucose: 144 mg/dL — ABNORMAL HIGH (ref 65–99)
Potassium: 3.9 mmol/L (ref 3.5–5.2)
Sodium: 138 mmol/L (ref 134–144)
Total Protein: 6.9 g/dL (ref 6.0–8.5)

## 2020-03-27 LAB — CK: Total CK: 108 U/L (ref 32–182)

## 2020-03-28 DIAGNOSIS — M791 Myalgia, unspecified site: Secondary | ICD-10-CM | POA: Insufficient documentation

## 2020-03-28 NOTE — Assessment & Plan Note (Signed)
Bilateral LE with report of muscle aches and burning that are present constantly throughout the day. Patient is several months post COVID and has no influenza symptoms. She has not had any recent trauma, so will not order xrays. Burning sensation appears to be related to neuropathic pain, will complete trial of duloxetine and have patient follow up in 2 weeks.  Will also collect CMP and CK levels to assess for any electrolyte abnormalities or muscle breakdown.

## 2020-03-28 NOTE — Assessment & Plan Note (Signed)
Increase lisinopril to 20mg  daily  F/u in 2 weeks

## 2020-05-08 ENCOUNTER — Encounter: Payer: Self-pay | Admitting: Emergency Medicine

## 2020-05-08 ENCOUNTER — Ambulatory Visit
Admission: EM | Admit: 2020-05-08 | Discharge: 2020-05-08 | Disposition: A | Discharge status: Home / Self Care | Payer: Medicaid Other | Attending: Emergency Medicine | Admitting: Emergency Medicine

## 2020-05-08 ENCOUNTER — Other Ambulatory Visit: Payer: Self-pay

## 2020-05-08 DIAGNOSIS — I10 Essential (primary) hypertension: Secondary | ICD-10-CM

## 2020-05-08 DIAGNOSIS — R0789 Other chest pain: Secondary | ICD-10-CM

## 2020-05-08 MED ORDER — KETOROLAC TROMETHAMINE 30 MG/ML IJ SOLN
30.0000 mg | Freq: Once | INTRAMUSCULAR | Status: AC
  Administered 2020-05-08: 30 mg via INTRAMUSCULAR

## 2020-05-08 MED ORDER — ACETAMINOPHEN 325 MG PO TABS
650.0000 mg | ORAL_TABLET | Freq: Once | ORAL | Status: AC
  Administered 2020-05-08: 650 mg via ORAL

## 2020-05-08 MED ORDER — IBUPROFEN 800 MG PO TABS: 800.0000 mg | ORAL_TABLET | Freq: Three times a day (TID) | ORAL | 0 refills | Status: DC

## 2020-05-08 NOTE — ED Provider Notes (Signed)
[facility]    CSN: [id number] Arrival date & time: 05/08/20  1152      History   Chief Complaint Chief Complaint  Patient presents with  . Cough    HPI Lisa Cooley is a 35 y.o. female history of hypertension, DM type II presenting today for evaluation of cough and chest discomfort.  Reports that symptoms began over the past 2 days.  Reports a discomfort in her central chest that is worse with movement.  Denies injury or trauma to chest.  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  She has had mild throat irritation and cough, but attributes this to allergies and did not feel these are correlated.  Pain will intensify at times.  HPI  Past Medical History:  Diagnosis Date  . Diabetes mellitus    Dx in [place] but not on meds  . Headache(784.0)   . Hypertension     Patient Active Problem List   Diagnosis Date Noted  . Myalgia 03/28/2020  . Post-viral cough syndrome 11/06/2019  . Hypertension associated with diabetes ([place]) 08/18/2018  . Healthcare maintenance 08/18/2018  . Screening for malignant neoplasm of cervix 12/20/2017  . Positive depression screening 12/20/2017  . Unprotected sex 09/17/2017  . 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 Screen for STD (sexually transmitted disease) 09/17/2017  . Diabetes mellitus type 2 in obese ([place]) 12/31/2016  . Epigastric pain 08/13/2011  . DM2 (diabetes mellitus, type 2) ([place]) 08/12/2011  . Morbid obesity ([place]) 08/12/2011  . Infertility, female 08/12/2011  . Irregular periods 08/12/2011    Past Surgical History:  Procedure Laterality Date  . NO PAST SURGERIES      OB History    Gravida  0   Para  0   Term  0   Preterm      AB  0   Living  0     SAB  0   IAB      Ectopic      Multiple      Live Births               Home Medications    Prior to Admission medications   Medication Sig Start Date End Date Taking? Authorizing Provider  ibuprofen  (ADVIL) 800 MG tablet Take 1 tablet (800 mg total) by mouth 3 (three) times daily. 05/08/20  Yes Larah Kuntzman C, PA-C  benzonatate (TESSALON) 100 MG capsule Take 1 capsule (100 mg total) by mouth 2 (two) times daily as needed for cough. Patient not taking: Reported on 03/26/2020 11/02/19   Autry-Lott, Naaman Plummer, DO  blood glucose meter kit and supplies KIT Dispense based on patient and insurance preference. Use up to four times daily as directed. (FOR ICD-9 250.00, 250.01). 09/01/19   Carollee Leitz, MD  DULoxetine (CYMBALTA) 30 MG capsule Take 1 capsule (30 mg total) by mouth daily. 03/26/20   Simmons-Robinson, Riki Sheer, MD  gabapentin (NEURONTIN) 300 MG capsule Take 1 capsule (300 mg total) by mouth 3 (three) times daily. Patient not taking: Reported on 03/26/2020 09/19/19   Leavy Cella, RPH-CPP  glipiZIDE (GLUCOTROL) 5 MG tablet Take 1 tablet (5 mg total) by mouth daily before breakfast. 09/19/19   Leavy Cella, RPH-CPP  glucose blood test strip Use as instructed 09/19/19   Zenia Resides, MD  Lancet Devices MISC 1 Container by Does not apply route 2 (two) times daily as needed. As directed. 09/19/19   Madison Hickman  A, MD  lisinopril (ZESTRIL) 20 MG tablet Take 1 tablet (20 mg total) by mouth daily. 03/26/20   Simmons-Robinson, Riki Sheer, MD  metFORMIN (GLUCOPHAGE) 1000 MG tablet Take 1 tablet (1,000 mg total) by mouth 2 (two) times daily with a meal. 09/19/19   Leavy Cella, RPH-CPP  methocarbamol (ROBAXIN) 500 MG tablet Take 1 tablet (500 mg total) by mouth 2 (two) times daily. Patient not taking: Reported on 03/26/2020 02/15/20   Henderly, Britni A, PA-C  rosuvastatin (CRESTOR) 20 MG tablet Take 1 tablet (20 mg total) by mouth daily. Patient not taking: No sig reported 09/19/19   Leavy Cella, RPH-CPP  tiZANidine (ZANAFLEX) 4 MG tablet Take 1-2 tablets (4-8 mg total) by mouth every 6 (six) hours as needed for muscle spasms. 09/28/19   Carollee Leitz, MD  albuterol (VENTOLIN HFA) 108 (90 Base) MCG/ACT  inhaler Inhale 2 puffs into the lungs every 6 (six) hours as needed for wheezing or shortness of breath. 08/30/19 09/13/19  Carollee Leitz, MD    Family History Family History  Problem Relation Age of Onset  . Cancer Father   . Heart disease Sister   . Other Neg Hx     Social History Social History   Tobacco Use  . Smoking status: Former Smoker    Packs/day: 0.25    Types: Cigarettes    Quit date: 04/20/2014    Years since quitting: 6.0  . Smokeless tobacco: Never Used  Substance Use Topics  . Alcohol use: No    Alcohol/week: 0.0 standard drinks  . Drug use: Yes    Types: Marijuana    Comment: last use 2 days ago     Allergies   Patient has no known allergies.   Review of Systems Review of Systems  Constitutional: Negative for activity change, appetite change, chills, fatigue and fever.  HENT: Positive for congestion and sore throat. Negative for ear pain, rhinorrhea, sinus pressure and trouble swallowing.   Eyes: Negative for discharge and redness.  Respiratory: Positive for cough. Negative for chest tightness and shortness of breath.   Cardiovascular: Positive for chest pain. Negative for leg swelling.  Gastrointestinal: Negative for abdominal pain, diarrhea, nausea and vomiting.  Musculoskeletal: Negative for myalgias.  Skin: Negative for rash.  Neurological: Negative for dizziness, light-headedness and headaches.     Physical Exam Triage Vital Signs ED Triage Vitals [05/08/20 1203]  Enc Vitals Group     BP (!) 184/91     Pulse Rate 91     Resp 18     Temp 99.2 F (37.3 C)     Temp Source Oral     SpO2 96 %     Weight      Height      Head Circumference      Peak Flow      Pain Score 6     Pain Loc      Pain Edu?      Excl. in [place]?    No data found.  Updated Vital Signs BP (!) 184/91 (BP Location: Right Arm)   Pulse 91   Temp 99.2 F (37.3 C) (Oral)   Resp 18   SpO2 96%   Visual Acuity Right Eye Distance:   Left Eye Distance:   Bilateral  Distance:    Right Eye Near:   Left Eye Near:    Bilateral Near:     Physical Exam Vitals and nursing note reviewed.  Constitutional:      Appearance: She is  well-developed.     Comments: No acute distress  HENT:     Head: Normocephalic and atraumatic.     Ears:     Comments: Bilateral ears without tenderness to palpation of external auricle, tragus and mastoid, EAC's without erythema or swelling, TM's with good bony landmarks and cone of light. Non erythematous.     Nose: Nose normal.     Mouth/Throat:     Comments: Oral mucosa pink and moist, no tonsillar enlargement or exudate. Posterior pharynx patent and nonerythematous, no uvula deviation or swelling. Normal phonation. Eyes:     Conjunctiva/sclera: Conjunctivae normal.  Cardiovascular:     Rate and Rhythm: Normal rate and regular rhythm.  Pulmonary:     Effort: Pulmonary effort is normal. No respiratory distress.     Comments: Breathing comfortably at rest, CTABL, no wheezing, rales or other adventitious sounds auscultated Abdominal:     General: There is no distension.  Musculoskeletal:        General: Normal range of motion.     Cervical back: Neck supple.     Comments: Central anterior chest with tenderness to palpation, patient becomes tearful with this  Skin:    General: Skin is warm and dry.  Neurological:     Mental Status: She is alert and oriented to person, place, and time.      UC Treatments / Results  Labs (all labs ordered are listed, but only abnormal results are displayed) Labs Reviewed - No data to display  EKG   Radiology No results found.  Procedures Procedures (including critical care time)  Medications Ordered in UC Medications  acetaminophen (TYLENOL) tablet 650 mg (650 mg Oral Given 05/08/20 1247)  ketorolac (TORADOL) 30 MG/ML injection 30 mg (30 mg Intramuscular Given 05/08/20 1327)    Initial Impression / Assessment and Plan / UC Course  I have reviewed the triage vital signs and  the nursing notes.  Pertinent labs & imaging results that were available during my care of the patient were reviewed by me and considered in my medical decision making (see chart for details).     EKG normal sinus rhythm, no acute signs of ischemia or infarction, reproducible tenderness to palpation, suspect most likely chest wall inflammation.  Lungs clear to auscultation.  Recommending anti-inflammatories, provided Toradol prior to discharge and continuing on ibuprofen 800 x10 days.  Discussed strict return precautions. Patient verbalized understanding and is agreeable with plan.  Final Clinical Impressions(s) / UC Diagnoses   Final diagnoses:  Chest wall pain  Essential hypertension     Discharge Instructions     EKG normal Ibuprofen 800 mg every 8 hours over the next 7-10 days May alternate ice and heat to chest Follow-up if not improving or worsening     ED Prescriptions    Medication Sig Dispense Auth. Provider   ibuprofen (ADVIL) 800 MG tablet Take 1 tablet (800 mg total) by mouth 3 (three) times daily. 21 tablet Murna Backer, [place] C, PA-C     PDMP not reviewed this encounter.   Janith Lima, PA-C 05/08/20 1332

## 2020-05-08 NOTE — Discharge Instructions (Addendum)
EKG normal Ibuprofen 800 mg every 8 hours over the next 7-10 days May alternate ice and heat to chest Follow-up if not improving or worsening

## 2020-05-08 NOTE — ED Triage Notes (Signed)
Pt sts some cough and pain in chest with cough; pt sts pain in mid chest worse with cough and movement x 2 days

## 2020-06-29 ENCOUNTER — Encounter (HOSPITAL_COMMUNITY): Payer: Self-pay | Admitting: Emergency Medicine

## 2020-06-29 ENCOUNTER — Emergency Department (HOSPITAL_COMMUNITY)
Admission: EM | Admit: 2020-06-29 | Discharge: 2020-06-29 | Disposition: A | Discharge status: Home / Self Care | Payer: Medicaid Other | Attending: Emergency Medicine | Admitting: Emergency Medicine

## 2020-06-29 ENCOUNTER — Other Ambulatory Visit: Payer: Self-pay

## 2020-06-29 DIAGNOSIS — E119 Type 2 diabetes mellitus without complications: Secondary | ICD-10-CM | POA: Insufficient documentation

## 2020-06-29 DIAGNOSIS — M549 Dorsalgia, unspecified: Secondary | ICD-10-CM | POA: Insufficient documentation

## 2020-06-29 DIAGNOSIS — Z7984 Long term (current) use of oral hypoglycemic drugs: Secondary | ICD-10-CM | POA: Insufficient documentation

## 2020-06-29 DIAGNOSIS — Z87891 Personal history of nicotine dependence: Secondary | ICD-10-CM | POA: Insufficient documentation

## 2020-06-29 DIAGNOSIS — I10 Essential (primary) hypertension: Secondary | ICD-10-CM | POA: Insufficient documentation

## 2020-06-29 DIAGNOSIS — Y92410 Unspecified street and highway as the place of occurrence of the external cause: Secondary | ICD-10-CM | POA: Insufficient documentation

## 2020-06-29 DIAGNOSIS — R202 Paresthesia of skin: Secondary | ICD-10-CM | POA: Insufficient documentation

## 2020-06-29 DIAGNOSIS — R079 Chest pain, unspecified: Secondary | ICD-10-CM | POA: Insufficient documentation

## 2020-06-29 DIAGNOSIS — M25561 Pain in right knee: Secondary | ICD-10-CM | POA: Insufficient documentation

## 2020-06-29 DIAGNOSIS — Z79899 Other long term (current) drug therapy: Secondary | ICD-10-CM | POA: Insufficient documentation

## 2020-06-29 MED ORDER — CYCLOBENZAPRINE HCL 10 MG PO TABS: 10.0000 mg | ORAL_TABLET | Freq: Two times a day (BID) | ORAL | 0 refills | Status: DC | PRN

## 2020-06-29 MED ORDER — IBUPROFEN 600 MG PO TABS: 600.0000 mg | ORAL_TABLET | Freq: Four times a day (QID) | ORAL | 0 refills | Status: DC | PRN

## 2020-06-29 NOTE — ED Notes (Signed)
Pt seen and discharged at triage by PA.
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
## 2020-07-25 ENCOUNTER — Other Ambulatory Visit: Payer: Self-pay

## 2020-07-25 ENCOUNTER — Encounter: Payer: Self-pay | Admitting: Family Medicine

## 2020-07-25 ENCOUNTER — Ambulatory Visit (INDEPENDENT_AMBULATORY_CARE_PROVIDER_SITE_OTHER): Payer: Self-pay | Admitting: Family Medicine

## 2020-07-25 VITALS — BP 139/97 | HR 108 | Ht 63.0 in | Wt 362.6 lb

## 2020-07-25 DIAGNOSIS — I152 Hypertension secondary to endocrine disorders: Secondary | ICD-10-CM

## 2020-07-25 DIAGNOSIS — M5410 Radiculopathy, site unspecified: Secondary | ICD-10-CM

## 2020-07-25 DIAGNOSIS — E1159 Type 2 diabetes mellitus with other circulatory complications: Secondary | ICD-10-CM

## 2020-07-25 DIAGNOSIS — E119 Type 2 diabetes mellitus without complications: Secondary | ICD-10-CM

## 2020-07-25 DIAGNOSIS — M79604 Pain in right leg: Secondary | ICD-10-CM | POA: Insufficient documentation

## 2020-07-25 LAB — POCT GLYCOSYLATED HEMOGLOBIN (HGB A1C): HbA1c, POC (controlled diabetic range): 7.4 % — AB (ref 0.0–7.0)

## 2020-07-25 MED ORDER — LISINOPRIL 20 MG PO TABS: 20.0000 mg | ORAL_TABLET | Freq: Every day | ORAL | 0 refills | Status: DC

## 2020-07-25 MED ORDER — GABAPENTIN 300 MG PO CAPS: 300.0000 mg | ORAL_CAPSULE | Freq: Three times a day (TID) | ORAL | 5 refills | Status: DC

## 2020-07-25 MED ORDER — TIZANIDINE HCL 4 MG PO TABS: 4.0000 mg | ORAL_TABLET | Freq: Four times a day (QID) | ORAL | 0 refills | Status: DC | PRN

## 2020-07-25 NOTE — Assessment & Plan Note (Signed)
Stable, A1c 7.4.  Continue metformin.  Follow-up with PCP in 1 month.

## 2020-07-25 NOTE — Assessment & Plan Note (Signed)
Related to bruising and muscle soreness, cannot rule out DVT.  Urgent DVT ordered for evaluation and patient called back by CMA for schedule.  Return to ED if having chest pain or shortness of breath.  Follow-up in 1 month with PCP if DVT ultrasound is negative.  Refill provided for Zanaflex and gabapentin at patient's request.
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
## 2020-07-25 NOTE — Patient Instructions (Signed)
Thank you for coming to see me today. It was a pleasure. Today we talked about:   We will get some labs today.  If they are abnormal or we need to do something about them, I will call you.  If they are normal, I will send you a message on MyChart (if it is active) or a letter in the mail.  If you don't hear from [place] in 2 weeks, please call the office at the number below.   We will get you scheduled for an ultrasound of your right leg.  Please follow-up with PCP in 1 month.  If you have any questions or concerns, please do not hesitate to call the office at [phone].  Best,   Luis Abed, DO

## 2020-07-25 NOTE — Assessment & Plan Note (Signed)
Goal less than 120/88, has been without lisinopril for 2 weeks.  Will restart lisinopril on her home dose.  Follow-up with PCP in 1 month to see if needs to be further adjusted.  Obtain BMP today with restart of lisinopril.  Recommend repeat follow-up.

## 2020-07-26 ENCOUNTER — Encounter: Payer: Self-pay | Admitting: Family Medicine

## 2020-07-26 LAB — BASIC METABOLIC PANEL
BUN/Creatinine Ratio: 15 (ref 9–23)
BUN: 11 mg/dL (ref 6–20)
CO2: 23 mmol/L (ref 20–29)
Calcium: 9.8 mg/dL (ref 8.7–10.2)
Chloride: 100 mmol/L (ref 96–106)
Creatinine, Ser: 0.74 mg/dL (ref 0.57–1.00)
Glucose: 115 mg/dL — ABNORMAL HIGH (ref 65–99)
Potassium: 4.2 mmol/L (ref 3.5–5.2)
Sodium: 136 mmol/L (ref 134–144)
eGFR: 109 mL/min/{1.73_m2} (ref >59)

## 2020-08-01 ENCOUNTER — Ambulatory Visit (HOSPITAL_COMMUNITY)
Admission: RE | Admit: 2020-08-01 | Discharge: 2020-08-01 | Disposition: A | Discharge status: Home / Self Care | Payer: Self-pay | Source: Ambulatory Visit | Attending: Family Medicine | Admitting: Family Medicine

## 2020-08-01 ENCOUNTER — Other Ambulatory Visit: Payer: Self-pay

## 2020-08-01 DIAGNOSIS — M79604 Pain in right leg: Secondary | ICD-10-CM | POA: Insufficient documentation

## 2020-08-22 ENCOUNTER — Ambulatory Visit (HOSPITAL_COMMUNITY)
Admission: EM | Admit: 2020-08-22 | Discharge: 2020-08-22 | Disposition: A | Discharge status: Home / Self Care | Payer: Self-pay | Attending: Family Medicine | Admitting: Family Medicine

## 2020-08-22 ENCOUNTER — Other Ambulatory Visit: Payer: Self-pay

## 2020-08-22 ENCOUNTER — Encounter (HOSPITAL_COMMUNITY): Payer: Self-pay

## 2020-08-22 DIAGNOSIS — H6992 Unspecified Eustachian tube disorder, left ear: Secondary | ICD-10-CM

## 2020-08-22 DIAGNOSIS — H6982 Other specified disorders of Eustachian tube, left ear: Secondary | ICD-10-CM

## 2020-08-22 DIAGNOSIS — H9202 Otalgia, left ear: Secondary | ICD-10-CM

## 2020-08-22 DIAGNOSIS — I10 Essential (primary) hypertension: Secondary | ICD-10-CM

## 2020-08-22 MED ORDER — PREDNISONE 20 MG PO TABS: 40.0000 mg | ORAL_TABLET | Freq: Every day | ORAL | 0 refills | Status: DC

## 2020-08-22 NOTE — ED Notes (Signed)
Dr haglar notified of blood pressure readings documented by Matilde Sprang, cma

## 2020-08-22 NOTE — ED Provider Notes (Signed)
[facility]   [id number] 08/22/20 Arrival Time: 0931  ASSESSMENT & PLAN:  1. Left ear pain   2. Elevated blood pressure reading in office with diagnosis of hypertension   3. Eustachian tube dysfunction, left      Discharge Instructions      Watch your blood sugars while take the short course of prednisone prescribed. Prednisone will cause your blood sugars to temporarily increase.  Your blood pressure was noted to be elevated during your visit today. If you are currently taking medication for high blood pressure, please ensure you are taking this as directed. If you do not have a history of high blood pressure and your blood pressure remains persistently elevated, you may need to begin taking a medication at some point. You may return here within the next few days to recheck if unable to see your primary care provider or if you do not have a one.  BP (!) 172/126 (BP Location: Right Wrist)   Pulse 82   Resp 20   LMP  (Within Days)   SpO2 100%   BP Readings from Last 3 Encounters:  08/22/20 (!) 172/126  07/25/20 (!) 139/97  06/29/20 139/82   May try OTC Claritin for ETD. If not helping, may begin trial of: Meds ordered this encounter  Medications   predniSONE (DELTASONE) 20 MG tablet    Sig: Take 2 tablets (40 mg total) by mouth daily.    Dispense:  10 tablet    Refill:  0    Recommend:  Follow-up Information     Dana Allan, MD.   Specialty: Family Medicine Why: To recheck your blood pressure. Contact information: [address] [place] [address] [phone]                  Reviewed expectations re: course of current medical issues. Questions answered. Outlined signs and symptoms indicating need for more acute intervention. Patient verbalized understanding. After Visit Summary given.   SUBJECTIVE: History from: patient.  Lisa Cooley is a 35 y.o. female who presents with complaint of left otalgia; without drainage;  without bleeding. Onset gradual,  few days . Recent cold symptoms:  questions mild . Fever: no. Overall normal PO intake without n/v. Sick contacts: no. OTC treatment: none.  Social History   Tobacco Use  Smoking Status Former   Packs/day: 0.25   Types: Cigarettes   Quit date: 04/20/2014   Years since quitting: 6.3  Smokeless Tobacco Never   Increased blood pressure noted today. Reports that she is treated for HTN. She reports taking medications as instructed, no chest pain on exertion, no dyspnea on exertion, no swelling of ankles, no orthostatic dizziness or lightheadedness, no orthopnea or paroxysmal nocturnal dyspnea, and no palpitations.   OBJECTIVE:  Vitals:   08/22/20 1040  BP: (!) 172/126  Pulse: 82  Resp: 20  TempSrc: Oral  SpO2: 100%    General appearance: alert; appears fatigued Ear Canal: normal TM: left with bulging; clear fluid; no signs of infection Neck: supple without LAD CV: reg Lungs: unlabored respirations, symmetrical air entry; cough: absent; no respiratory distress Skin: warm and dry Psychological: alert and cooperative; normal mood and affect  No Known Allergies  Past Medical History:  Diagnosis Date   Diabetes mellitus    Dx in [place] but not on meds   Headache(784.0)    Hypertension    Family History  Problem Relation Age of Onset   Cancer Father    Heart disease Sister  Other Neg Hx    Social History   Socioeconomic History   Marital status: Single    Spouse name: Not on file   Number of children: Not on file   Years of education: Not on file   Highest education level: Not on file  Occupational History   Not on file  Tobacco Use   Smoking status: Former    Packs/day: 0.25    Types: Cigarettes    Quit date: 04/20/2014    Years since quitting: 6.3   Smokeless tobacco: Never  Substance and Sexual Activity   Alcohol use: No    Alcohol/week: 0.0 standard drinks   Drug use: Yes    Types: Marijuana    Comment: last use 2  days ago   Sexual activity: Yes    Birth control/protection: None  Other Topics Concern   Not on file  Social History Narrative   Not on file   Social Determinants of Health   Financial Resource Strain: High Risk   Difficulty of Paying Living Expenses: Very hard  Food Insecurity: Not on file  Transportation Needs: Not on file  Physical Activity: Not on file  Stress: Stress Concern Present   Feeling of Stress : Rather much  Social Connections: Not on file  Intimate Partner Violence: Not on file             Joaquin, MD 08/22/20 1317

## 2020-08-22 NOTE — ED Triage Notes (Signed)
T reports left ear pain x 2 days. Denies fever, chills, drainage.

## 2020-08-22 NOTE — ED Notes (Signed)
BP reported to Dr. Hagler 

## 2020-08-22 NOTE — Discharge Instructions (Addendum)
Watch your blood sugars while take the short course of prednisone prescribed. Prednisone will cause your blood sugars to temporarily increase.  Your blood pressure was noted to be elevated during your visit today. If you are currently taking medication for high blood pressure, please ensure you are taking this as directed. If you do not have a history of high blood pressure and your blood pressure remains persistently elevated, you may need to begin taking a medication at some point. You may return here within the next few days to recheck if unable to see your primary care provider or if you do not have a one.  BP (!) 172/126 (BP Location: Right Wrist)   Pulse 82   Resp 20   LMP  (Within Days)   SpO2 100%   BP Readings from Last 3 Encounters:  08/22/20 (!) 172/126  07/25/20 (!) 139/97  06/29/20 139/82

## 2020-08-28 ENCOUNTER — Encounter (HOSPITAL_COMMUNITY): Payer: Self-pay

## 2020-08-28 ENCOUNTER — Ambulatory Visit (HOSPITAL_COMMUNITY)
Admission: EM | Admit: 2020-08-28 | Discharge: 2020-08-28 | Disposition: A | Discharge status: Home / Self Care | Payer: Self-pay | Attending: Medical Oncology | Admitting: Medical Oncology

## 2020-08-28 ENCOUNTER — Other Ambulatory Visit: Payer: Self-pay

## 2020-08-28 DIAGNOSIS — Z7952 Long term (current) use of systemic steroids: Secondary | ICD-10-CM | POA: Insufficient documentation

## 2020-08-28 DIAGNOSIS — U071 COVID-19: Secondary | ICD-10-CM | POA: Insufficient documentation

## 2020-08-28 DIAGNOSIS — Z87891 Personal history of nicotine dependence: Secondary | ICD-10-CM | POA: Insufficient documentation

## 2020-08-28 DIAGNOSIS — Z79899 Other long term (current) drug therapy: Secondary | ICD-10-CM | POA: Insufficient documentation

## 2020-08-28 DIAGNOSIS — Z7984 Long term (current) use of oral hypoglycemic drugs: Secondary | ICD-10-CM | POA: Insufficient documentation

## 2020-08-28 DIAGNOSIS — J069 Acute upper respiratory infection, unspecified: Secondary | ICD-10-CM

## 2020-08-28 LAB — SARS CORONAVIRUS 2 (TAT 6-24 HRS): SARS Coronavirus 2: POSITIVE — AB

## 2020-08-28 MED ORDER — FLUTICASONE PROPIONATE 50 MCG/ACT NA SUSP: 2.0000 | Freq: Every day | NASAL | 0 refills | Status: DC

## 2020-08-28 NOTE — ED Triage Notes (Signed)
Pt in with c/o cough, body aches, chills and headaches for the past few days  Pt requesting covid test

## 2020-08-28 NOTE — ED Provider Notes (Signed)
[facility]    CSN: [id number] Arrival date & time: 08/28/20  1002      History   Chief Complaint Chief Complaint  Patient presents with   Cough   Chills   Headache   Generalized Body Aches    HPI Lisa Cooley is a 35 y.o. female.   HPI  Cold Symptoms: Pt reports that last week she was having left ear pain. She was seen in office and diagnosed with middle ear effusion. She reports that these symptoms have not improved but instead she has now had low grade fever, dry cough, nasal congestion, sinus headache. She has been taking OTC sinus pills, nyquil for symptoms without relief. She wishes to be tested for COVID-19. No chest pains, fever over 102F, wheezing.    Past Medical History:  Diagnosis Date   Diabetes mellitus    Dx in [place] but not on meds   Headache(784.0)    Hypertension     Patient Active Problem List   Diagnosis Date Noted   Right leg pain 07/25/2020   Myalgia 03/28/2020   Post-viral cough syndrome 11/06/2019   Hypertension associated with diabetes ([place]) 08/18/2018   Healthcare maintenance 08/18/2018   Screening for malignant neoplasm of cervix 12/20/2017   Positive depression screening 12/20/2017   Unprotected sex 09/17/2017   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   Screen for STD (sexually transmitted disease) 09/17/2017   Diabetes mellitus type 2 in obese ([place]) 12/31/2016   Epigastric pain 08/13/2011   DM2 (diabetes mellitus, type 2) ([place]) 08/12/2011   Morbid obesity ([place]) 08/12/2011   Infertility, female 08/12/2011   Irregular periods 08/12/2011    Past Surgical History:  Procedure Laterality Date   NO PAST SURGERIES      OB History     Gravida  0   Para  0   Term  0   Preterm      AB  0   Living  0      SAB  0   IAB      Ectopic      Multiple      Live Births               Home Medications    Prior to Admission medications   Medication Sig Start Date End Date Taking?  Authorizing Provider  blood glucose meter kit and supplies KIT Dispense based on patient and insurance preference. Use up to four times daily as directed. (FOR ICD-9 250.00, 250.01). 09/01/19   Carollee Leitz, MD  DULoxetine (CYMBALTA) 30 MG capsule Take 1 capsule (30 mg total) by mouth daily. 03/26/20   Simmons-Robinson, Riki Sheer, MD  gabapentin (NEURONTIN) 300 MG capsule Take 1 capsule (300 mg total) by mouth 3 (three) times daily. 07/25/20   Meccariello, Bernita Raisin, DO  glipiZIDE (GLUCOTROL) 5 MG tablet Take 1 tablet (5 mg total) by mouth daily before breakfast. 09/19/19   Leavy Cella, RPH-CPP  glucose blood test strip Use as instructed 09/19/19   Zenia Resides, MD  ibuprofen (ADVIL) 600 MG tablet Take 1 tablet (600 mg total) by mouth every 6 (six) hours as needed. 06/29/20   Domenic Moras, PA-C  Lancet Devices MISC 1 Container by Does not apply route 2 (two) times daily as needed. As directed. 09/19/19   Zenia Resides, MD  lisinopril (ZESTRIL) 20 MG tablet Take 1 tablet (20 mg total) by mouth daily. 07/25/20   Meccariello, Bernita Raisin, DO  metFORMIN (GLUCOPHAGE) 1000 MG  tablet Take 1 tablet (1,000 mg total) by mouth 2 (two) times daily with a meal. 09/19/19   Leavy Cella, RPH-CPP  predniSONE (DELTASONE) 20 MG tablet Take 2 tablets (40 mg total) by mouth daily. 08/22/20   Vanessa Kick, MD  rosuvastatin (CRESTOR) 20 MG tablet Take 1 tablet (20 mg total) by mouth daily. Patient not taking: No sig reported 09/19/19   Leavy Cella, RPH-CPP  albuterol (VENTOLIN HFA) 108 (90 Base) MCG/ACT inhaler Inhale 2 puffs into the lungs every 6 (six) hours as needed for wheezing or shortness of breath. 08/30/19 09/13/19  Carollee Leitz, MD    Family History Family History  Problem Relation Age of Onset   Cancer Father    Heart disease Sister    Other Neg Hx     Social History Social History   Tobacco Use   Smoking status: Former    Packs/day: 0.25    Types: Cigarettes    Quit date: 04/20/2014    Years since  quitting: 6.3   Smokeless tobacco: Never  Substance Use Topics   Alcohol use: No    Alcohol/week: 0.0 standard drinks   Drug use: Yes    Types: Marijuana    Comment: last use 2 days ago     Allergies   Patient has no known allergies.   Review of Systems Review of Systems  As stated above in HPI Physical Exam Triage Vital Signs ED Triage Vitals [08/28/20 1107]  Enc Vitals Group     BP (!) 167/93     Pulse Rate 81     Resp 20     Temp 99.2 F (37.3 C)     Temp Source Oral     SpO2 97 %     Weight      Height      Head Circumference      Peak Flow      Pain Score 10     Pain Loc      Pain Edu?      Excl. in [place]?    No data found.  Updated Vital Signs BP (!) 167/93 (BP Location: Right Arm)   Pulse 81   Temp 99.2 F (37.3 C) (Oral)   Resp 20   LMP 08/17/2020 (Approximate)   SpO2 97%   Physical Exam Vitals and nursing note reviewed.  Constitutional:      General: She is not in acute distress.    Appearance: She is well-developed. She is obese. She is not ill-appearing, toxic-appearing or diaphoretic.  HENT:     Head: Normocephalic and atraumatic.     Right Ear: Tympanic membrane normal.     Left Ear: Tympanic membrane normal.     Ears:     Comments: Clear middle ear effusion bilaterally     Mouth/Throat:     Mouth: Mucous membranes are moist.     Pharynx: Oropharynx is clear.  Eyes:     Extraocular Movements: Extraocular movements intact.     Right eye: Normal extraocular motion.     Left eye: Normal extraocular motion.     Pupils: Pupils are equal, round, and reactive to light.  Cardiovascular:     Rate and Rhythm: Normal rate and regular rhythm.     Heart sounds: Normal heart sounds.  Pulmonary:     Effort: Pulmonary effort is normal.     Breath sounds: Normal breath sounds.  Musculoskeletal:     Cervical back: Normal range of motion and neck supple.  Lymphadenopathy:     Cervical: No cervical adenopathy.  Skin:    General: Skin is warm.   Neurological:     Mental Status: She is alert.     UC Treatments / Results  Labs (all labs ordered are listed, but only abnormal results are displayed) Labs Reviewed - No data to display  EKG   Radiology No results found.  Procedures Procedures (including critical care time)  Medications Ordered in UC Medications - No data to display  Initial Impression / Assessment and Plan / UC Course  I have reviewed the triage vital signs and the nursing notes.  Pertinent labs & imaging results that were available during my care of the patient were reviewed by me and considered in my medical decision making (see chart for details).     New.  Discussed with patient that this appears viral in nature.  COVID-19 testing pending.  For now rest, hydration with water, low-salt diet and I have recommended Flonase for her to start.  Discussed red flag signs and symptoms.  Work note written for patient per her request. Final Clinical Impressions(s) / UC Diagnoses   Final diagnoses:  None   Discharge Instructions   None    ED Prescriptions   None    PDMP not reviewed this encounter.   Hughie Closs, [place] 08/28/20 1129

## 2020-08-29 ENCOUNTER — Encounter: Payer: Self-pay | Admitting: Family Medicine

## 2020-09-15 ENCOUNTER — Other Ambulatory Visit: Payer: Self-pay

## 2020-09-15 ENCOUNTER — Encounter (HOSPITAL_COMMUNITY): Payer: Self-pay

## 2020-09-15 ENCOUNTER — Ambulatory Visit (HOSPITAL_COMMUNITY)
Admission: EM | Admit: 2020-09-15 | Discharge: 2020-09-15 | Disposition: A | Discharge status: Home / Self Care | Payer: 59 | Attending: Family Medicine | Admitting: Family Medicine

## 2020-09-15 DIAGNOSIS — N760 Acute vaginitis: Secondary | ICD-10-CM | POA: Diagnosis present

## 2020-09-15 LAB — POCT URINALYSIS DIPSTICK, ED / UC
Bilirubin Urine: NEGATIVE
Glucose, UA: NEGATIVE mg/dL
Hgb urine dipstick: NEGATIVE
Leukocytes,Ua: NEGATIVE
Nitrite: NEGATIVE
Protein, ur: NEGATIVE mg/dL
Specific Gravity, Urine: 1.025 (ref 1.005–1.030)
Urobilinogen, UA: 0.2 mg/dL (ref 0.0–1.0)
pH: 5.5 (ref 5.0–8.0)

## 2020-09-15 LAB — POC URINE PREG, ED: Preg Test, Ur: NEGATIVE

## 2020-09-15 LAB — HIV ANTIBODY (ROUTINE TESTING W REFLEX): HIV Screen 4th Generation wRfx: NONREACTIVE

## 2020-09-15 MED ORDER — FLUCONAZOLE 150 MG PO TABS: 150.0000 mg | ORAL_TABLET | Freq: Once | ORAL | 0 refills | Status: AC

## 2020-09-15 NOTE — ED Provider Notes (Signed)
[facility]    CSN: [id number] Arrival date & time: 09/15/20  1006      History   Chief Complaint Chief Complaint  Patient presents with   Vaginal Itching    HPI Lisa Cooley is a 35 y.o. female.   Patient presenting today with 1 week history of vaginal irritation, itching.  Denies any significant discharge, dysuria, hematuria, flank or abdominal pain, rashes.  No known exposures to any STIs.  Not try anything over-the-counter for symptoms.  Unsure of when her LMP was, wanting to be tested for pregnancy.   Past Medical History:  Diagnosis Date   Diabetes mellitus    Dx in [place] but not on meds   Headache(784.0)    Hypertension     Patient Active Problem List   Diagnosis Date Noted   Right leg pain 07/25/2020   Myalgia 03/28/2020   Post-viral cough syndrome 11/06/2019   Hypertension associated with diabetes ([place]) 08/18/2018   Healthcare maintenance 08/18/2018   Screening for malignant neoplasm of cervix 12/20/2017   Positive depression screening 12/20/2017   Unprotected sex 09/17/2017   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   Screen for STD (sexually transmitted disease) 09/17/2017   Diabetes mellitus type 2 in obese ([place]) 12/31/2016   Epigastric pain 08/13/2011   DM2 (diabetes mellitus, type 2) ([place]) 08/12/2011   Morbid obesity ([place]) 08/12/2011   Infertility, female 08/12/2011   Irregular periods 08/12/2011    Past Surgical History:  Procedure Laterality Date   NO PAST SURGERIES      OB History     Gravida  0   Para  0   Term  0   Preterm      AB  0   Living  0      SAB  0   IAB      Ectopic      Multiple      Live Births               Home Medications    Prior to Admission medications   Medication Sig Start Date End Date Taking? Authorizing Provider  fluconazole (DIFLUCAN) 150 MG tablet Take 1 tablet (150 mg total) by mouth once for 1 dose. 09/15/20 09/15/20 Yes Volney American, PA-C   fluconazole (DIFLUCAN) 150 MG tablet Take 1 tablet (150 mg total) by mouth once for 1 dose. 09/15/20 09/15/20 Yes Volney American, PA-C  blood glucose meter kit and supplies KIT Dispense based on patient and insurance preference. Use up to four times daily as directed. (FOR ICD-9 250.00, 250.01). 09/01/19   Carollee Leitz, MD  DULoxetine (CYMBALTA) 30 MG capsule Take 1 capsule (30 mg total) by mouth daily. 03/26/20   Simmons-Robinson, Mak[place], MD  fluticasone (FLONASE) 50 MCG/ACT nasal spray Place 2 sprays into both nostrils daily. 08/28/20   Hughie Closs, PA-C  gabapentin (NEURONTIN) 300 MG capsule Take 1 capsule (300 mg total) by mouth 3 (three) times daily. 07/25/20   Meccariello, Bernita Raisin, DO  glipiZIDE (GLUCOTROL) 5 MG tablet Take 1 tablet (5 mg total) by mouth daily before breakfast. 09/19/19   Leavy Cella, RPH-CPP  glucose blood test strip Use as instructed 09/19/19   Zenia Resides, MD  ibuprofen (ADVIL) 600 MG tablet Take 1 tablet (600 mg total) by mouth every 6 (six) hours as needed. 06/29/20   Domenic Moras, PA-C  Lancet Devices MISC 1 Container by Does not apply route 2 (two) times daily as needed. As  directed. 09/19/19   Zenia Resides, MD  lisinopril (ZESTRIL) 20 MG tablet Take 1 tablet (20 mg total) by mouth daily. 07/25/20   Meccariello, Bernita Raisin, DO  metFORMIN (GLUCOPHAGE) 1000 MG tablet Take 1 tablet (1,000 mg total) by mouth 2 (two) times daily with a meal. 09/19/19   Leavy Cella, RPH-CPP  predniSONE (DELTASONE) 20 MG tablet Take 2 tablets (40 mg total) by mouth daily. 08/22/20   Vanessa Kick, MD  rosuvastatin (CRESTOR) 20 MG tablet Take 1 tablet (20 mg total) by mouth daily. Patient not taking: No sig reported 09/19/19   Leavy Cella, RPH-CPP  albuterol (VENTOLIN HFA) 108 (90 Base) MCG/ACT inhaler Inhale 2 puffs into the lungs every 6 (six) hours as needed for wheezing or shortness of breath. 08/30/19 09/13/19  Carollee Leitz, MD    Family History Family History   Problem Relation Age of Onset   Cancer Father    Heart disease Sister    Other Neg Hx     Social History Social History   Tobacco Use   Smoking status: Former    Packs/day: 0.25    Types: Cigarettes    Quit date: 04/20/2014    Years since quitting: 6.4   Smokeless tobacco: Never  Substance Use Topics   Alcohol use: No    Alcohol/week: 0.0 standard drinks   Drug use: Yes    Types: Marijuana    Comment: last use 2 days ago     Allergies   Patient has no known allergies.   Review of Systems Review of Systems Per HPI  Physical Exam Triage Vital Signs ED Triage Vitals  Enc Vitals Group     BP 09/15/20 1048 (!) 120/93     Pulse Rate 09/15/20 1048 93     Resp 09/15/20 1048 19     Temp 09/15/20 1048 98.2 F (36.8 C)     Temp Source 09/15/20 1048 Oral     SpO2 09/15/20 1048 96 %     Weight --      Height --      Head Circumference --      Peak Flow --      Pain Score 09/15/20 1047 0     Pain Loc --      Pain Edu? --      Excl. in [place]? --    No data found.  Updated Vital Signs BP (!) 120/93 (BP Location: Right Wrist)   Pulse 93   Temp 98.2 F (36.8 C) (Oral)   Resp 19   LMP  (Within Months) Comment: 1 month  SpO2 96%   Visual Acuity Right Eye Distance:   Left Eye Distance:   Bilateral Distance:    Right Eye Near:   Left Eye Near:    Bilateral Near:     Physical Exam Vitals and nursing note reviewed.  Constitutional:      Appearance: Normal appearance. She is not ill-appearing.  HENT:     Head: Atraumatic.     Mouth/Throat:     Mouth: Mucous membranes are moist.     Pharynx: Oropharynx is clear.  Eyes:     Extraocular Movements: Extraocular movements intact.     Conjunctiva/sclera: Conjunctivae normal.  Cardiovascular:     Rate and Rhythm: Normal rate and regular rhythm.     Heart sounds: Normal heart sounds.  Pulmonary:     Effort: Pulmonary effort is normal.     Breath sounds: Normal breath sounds.  Abdominal:  General: Bowel  sounds are normal. There is no distension.     Palpations: Abdomen is soft.     Tenderness: There is no abdominal tenderness. There is no guarding.  Genitourinary:    Comments: GU exam deferred, self swab performed Musculoskeletal:        General: Normal range of motion.     Cervical back: Normal range of motion and neck supple.  Skin:    General: Skin is warm and dry.  Neurological:     Mental Status: She is alert and oriented to person, place, and time.  Psychiatric:        Mood and Affect: Mood normal.        Thought Content: Thought content normal.        Judgment: Judgment normal.     UC Treatments / Results  Labs (all labs ordered are listed, but only abnormal results are displayed) Labs Reviewed  POCT URINALYSIS DIPSTICK, ED / UC - Abnormal; Notable for the following components:      Result Value   Ketones, ur TRACE (*)    All other components within normal limits  HIV ANTIBODY (ROUTINE TESTING W REFLEX)  RPR  POC URINE PREG, ED  CERVICOVAGINAL ANCILLARY ONLY    EKG   Radiology No results found.  Procedures Procedures (including critical care time)  Medications Ordered in UC Medications - No data to display  Initial Impression / Assessment and Plan / UC Course  I have reviewed the triage vital signs and the nursing notes.  Pertinent labs & imaging results that were available during my care of the patient were reviewed by me and considered in my medical decision making (see chart for details).     Vital signs, exam reassuring, urine pregnancy and UA benign.  Vaginal swab, HIV and syphilis labs pending.  We will treat for yeast infection while awaiting these results as she states this feels consistent with what she has had for yeast infections in the past.  Discussed good vaginal hygiene, safe sexual practices, abstinence until results return.  Return for acutely worsening symptoms.  Final Clinical Impressions(s) / UC Diagnoses   Final diagnoses:  Acute  vaginitis   Discharge Instructions   None    ED Prescriptions     Medication Sig Dispense Auth. Provider   fluconazole (DIFLUCAN) 150 MG tablet Take 1 tablet (150 mg total) by mouth once for 1 dose. 1 tablet Volney American, [place]   fluconazole (DIFLUCAN) 150 MG tablet Take 1 tablet (150 mg total) by mouth once for 1 dose. 1 tablet Volney American, [place]      PDMP not reviewed this encounter.   Volney American, [place] 09/15/20 1143

## 2020-09-15 NOTE — ED Triage Notes (Signed)
Pt reports on and off vaginal discomfort x 1 week.  Pt requested STD's test.

## 2020-09-16 ENCOUNTER — Encounter: Payer: Self-pay | Admitting: Family Medicine

## 2020-09-16 LAB — CERVICOVAGINAL ANCILLARY ONLY
Bacterial Vaginitis (gardnerella): POSITIVE — AB
Candida Glabrata: NEGATIVE
Candida Vaginitis: NEGATIVE
Chlamydia: NEGATIVE
Comment: NEGATIVE
Comment: NEGATIVE
Comment: NEGATIVE
Comment: NEGATIVE
Comment: NEGATIVE
Comment: NORMAL
Neisseria Gonorrhea: NEGATIVE
Trichomonas: POSITIVE — AB

## 2020-09-16 LAB — RPR: RPR Ser Ql: NONREACTIVE

## 2020-09-17 ENCOUNTER — Other Ambulatory Visit: Payer: Self-pay | Admitting: Family Medicine

## 2020-09-17 ENCOUNTER — Encounter: Payer: Self-pay | Admitting: Family Medicine

## 2020-09-17 MED ORDER — METRONIDAZOLE 500 MG PO TABS: 500.0000 mg | ORAL_TABLET | Freq: Two times a day (BID) | ORAL | 0 refills | Status: AC

## 2020-09-17 NOTE — Progress Notes (Signed)
Prescription sent to pharmacy for Flagyl 500 mg BID for recent results that were obtained in ED.  Positive for Trichomoniasis 08/14.  Dana Allan, MD Family Medicine Residency

## 2020-09-19 ENCOUNTER — Telehealth (HOSPITAL_COMMUNITY): Payer: Self-pay | Admitting: Emergency Medicine

## 2020-09-19 NOTE — Telephone Encounter (Signed)
Opened in error

## 2020-09-30 ENCOUNTER — Encounter (HOSPITAL_COMMUNITY): Payer: Self-pay | Admitting: Emergency Medicine

## 2020-09-30 ENCOUNTER — Other Ambulatory Visit: Payer: Self-pay

## 2020-09-30 ENCOUNTER — Ambulatory Visit (HOSPITAL_COMMUNITY)
Admission: EM | Admit: 2020-09-30 | Discharge: 2020-09-30 | Disposition: A | Discharge status: Home / Self Care | Payer: 59 | Attending: Emergency Medicine | Admitting: Emergency Medicine

## 2020-09-30 DIAGNOSIS — Z3202 Encounter for pregnancy test, result negative: Secondary | ICD-10-CM | POA: Diagnosis not present

## 2020-09-30 DIAGNOSIS — N912 Amenorrhea, unspecified: Secondary | ICD-10-CM

## 2020-09-30 LAB — POC URINE PREG, ED: Preg Test, Ur: NEGATIVE

## 2020-09-30 NOTE — ED Triage Notes (Signed)
PT reports last menstrual was 08/17/20. Needs pregnancy test

## 2020-09-30 NOTE — Discharge Instructions (Addendum)
Follow up with [facility] for further evaluation of your irregular periods.

## 2020-09-30 NOTE — ED Provider Notes (Signed)
[facility]    CSN: [id number] Arrival date & time: 09/30/20  1127      History   Chief Complaint Chief Complaint  Patient presents with   Possible Pregnancy    HPI Lisa Cooley is a 35 y.o. female.   Patient here for evaluation of possible pregnancy.  Reports last menstrual period was 08/17/20.  Did not take a home pregnancy test.  Denies any abdominal or lower back pain.  Denies any trauma, injury, or other precipitating event.  Denies any specific alleviating or aggravating factors.  Denies any fevers, chest pain, shortness of breath, N/V/D, numbness, tingling, weakness, abdominal pain, or headaches.    The history is provided by the patient.  Possible Pregnancy   Past Medical History:  Diagnosis Date   Diabetes mellitus    Dx in [place] but not on meds   Headache(784.0)    Hypertension     Patient Active Problem List   Diagnosis Date Noted   Right leg pain 07/25/2020   Myalgia 03/28/2020   Post-viral cough syndrome 11/06/2019   Hypertension associated with diabetes ([place]) 08/18/2018   Healthcare maintenance 08/18/2018   Screening for malignant neoplasm of cervix 12/20/2017   Positive depression screening 12/20/2017   Unprotected sex 09/17/2017   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   Screen for STD (sexually transmitted disease) 09/17/2017   Diabetes mellitus type 2 in obese ([place]) 12/31/2016   Epigastric pain 08/13/2011   DM2 (diabetes mellitus, type 2) ([place]) 08/12/2011   Morbid obesity ([place]) 08/12/2011   Infertility, female 08/12/2011   Irregular periods 08/12/2011    Past Surgical History:  Procedure Laterality Date   NO PAST SURGERIES      OB History     Gravida  0   Para  0   Term  0   Preterm      AB  0   Living  0      SAB  0   IAB      Ectopic      Multiple      Live Births               Home Medications    Prior to Admission medications   Medication Sig Start Date End Date Taking?  Authorizing Provider  gabapentin (NEURONTIN) 300 MG capsule Take 1 capsule (300 mg total) by mouth 3 (three) times daily. 07/25/20  Yes Meccariello, Bernita Raisin, DO  lisinopril (ZESTRIL) 20 MG tablet Take 1 tablet (20 mg total) by mouth daily. 07/25/20  Yes Meccariello, Bernita Raisin, DO  metFORMIN (GLUCOPHAGE) 1000 MG tablet Take 1 tablet (1,000 mg total) by mouth 2 (two) times daily with a meal. 09/19/19  Yes Leavy Cella, RPH-CPP  blood glucose meter kit and supplies KIT Dispense based on patient and insurance preference. Use up to four times daily as directed. (FOR ICD-9 250.00, 250.01). 09/01/19   Carollee Leitz, MD  DULoxetine (CYMBALTA) 30 MG capsule Take 1 capsule (30 mg total) by mouth daily. 03/26/20   Simmons-Robinson, Makiera, MD  fluticasone (FLONASE) 50 MCG/ACT nasal spray Place 2 sprays into both nostrils daily. 08/28/20   Hughie Closs, PA-C  glipiZIDE (GLUCOTROL) 5 MG tablet Take 1 tablet (5 mg total) by mouth daily before breakfast. 09/19/19   Leavy Cella, RPH-CPP  glucose blood test strip Use as instructed 09/19/19   Zenia Resides, MD  ibuprofen (ADVIL) 600 MG tablet Take 1 tablet (600 mg total) by mouth every 6 (six)  hours as needed. 06/29/20   Domenic Moras, PA-C  Lancet Devices MISC 1 Container by Does not apply route 2 (two) times daily as needed. As directed. 09/19/19   Zenia Resides, MD  predniSONE (DELTASONE) 20 MG tablet Take 2 tablets (40 mg total) by mouth daily. 08/22/20   Vanessa Kick, MD  rosuvastatin (CRESTOR) 20 MG tablet Take 1 tablet (20 mg total) by mouth daily. Patient not taking: No sig reported 09/19/19   Leavy Cella, RPH-CPP  albuterol (VENTOLIN HFA) 108 (90 Base) MCG/ACT inhaler Inhale 2 puffs into the lungs every 6 (six) hours as needed for wheezing or shortness of breath. 08/30/19 09/13/19  Carollee Leitz, MD    Family History Family History  Problem Relation Age of Onset   Cancer Father    Heart disease Sister    Other Neg Hx     Social History Social  History   Tobacco Use   Smoking status: Former    Packs/day: 0.25    Types: Cigarettes    Quit date: 04/20/2014    Years since quitting: 6.4   Smokeless tobacco: Never  Substance Use Topics   Alcohol use: No    Alcohol/week: 0.0 standard drinks   Drug use: Yes    Types: Marijuana    Comment: last use 2 days ago     Allergies   Patient has no known allergies.   Review of Systems Review of Systems  Genitourinary:  Positive for menstrual problem.  All other systems reviewed and are negative.   Physical Exam Triage Vital Signs ED Triage Vitals  Enc Vitals Group     BP 09/30/20 1252 (!) 163/110     Pulse Rate 09/30/20 1252 95     Resp 09/30/20 1252 16     Temp 09/30/20 1252 98.6 F (37 C)     Temp Source 09/30/20 1252 Oral     SpO2 09/30/20 1252 97 %     Weight --      Height --      Head Circumference --      Peak Flow --      Pain Score 09/30/20 1249 8     Pain Loc --      Pain Edu? --      Excl. in [place]? --    No data found.  Updated Vital Signs BP (!) 163/110   Pulse 95   Temp 98.6 F (37 C) (Oral)   Resp 16   LMP 08/17/2020   SpO2 97%   Visual Acuity Right Eye Distance:   Left Eye Distance:   Bilateral Distance:    Right Eye Near:   Left Eye Near:    Bilateral Near:     Physical Exam Vitals and nursing note reviewed.  Constitutional:      General: She is not in acute distress.    Appearance: Normal appearance. She is not ill-appearing, toxic-appearing or diaphoretic.  HENT:     Head: Normocephalic and atraumatic.  Eyes:     Conjunctiva/sclera: Conjunctivae normal.  Cardiovascular:     Rate and Rhythm: Normal rate.     Pulses: Normal pulses.  Pulmonary:     Effort: Pulmonary effort is normal.  Abdominal:     General: Abdomen is flat.  Genitourinary:    Comments: Declines Musculoskeletal:        General: Normal range of motion.     Cervical back: Normal range of motion.  Skin:    General: Skin is warm and dry.  Neurological:      General: No focal deficit present.     Mental Status: She is alert and oriented to person, place, and time.  Psychiatric:        Mood and Affect: Mood normal.     UC Treatments / Results  Labs (all labs ordered are listed, but only abnormal results are displayed) Labs Reviewed  POC URINE PREG, ED    EKG   Radiology No results found.  Procedures Procedures (including critical care time)  Medications Ordered in UC Medications - No data to display  Initial Impression / Assessment and Plan / UC Course  I have reviewed the triage vital signs and the nursing notes.  Pertinent labs & imaging results that were available during my care of the patient were reviewed by me and considered in my medical decision making (see chart for details).    Amenorrhea.  Urine pregnancy test negative.  Assessment negative for any red flags or concerns.  Recommend following up with GYN for further evaluation. Final Clinical Impressions(s) / UC Diagnoses   Final diagnoses:  Amenorrhea  Negative pregnancy test     Discharge Instructions      Follow up with [facility] for further evaluation of your irregular periods.      ED Prescriptions   None    PDMP not reviewed this encounter.   Pearson Forster, NP 09/30/20 1330

## 2020-10-08 ENCOUNTER — Ambulatory Visit (INDEPENDENT_AMBULATORY_CARE_PROVIDER_SITE_OTHER): Payer: 59 | Admitting: Family Medicine

## 2020-10-08 ENCOUNTER — Other Ambulatory Visit: Payer: Self-pay

## 2020-10-08 VITALS — BP 144/111 | HR 104 | Ht 67.0 in | Wt 372.2 lb

## 2020-10-08 DIAGNOSIS — E1169 Type 2 diabetes mellitus with other specified complication: Secondary | ICD-10-CM | POA: Diagnosis not present

## 2020-10-08 DIAGNOSIS — R350 Frequency of micturition: Secondary | ICD-10-CM | POA: Diagnosis not present

## 2020-10-08 DIAGNOSIS — E1159 Type 2 diabetes mellitus with other circulatory complications: Secondary | ICD-10-CM | POA: Diagnosis not present

## 2020-10-08 DIAGNOSIS — E669 Obesity, unspecified: Secondary | ICD-10-CM

## 2020-10-08 DIAGNOSIS — I152 Hypertension secondary to endocrine disorders: Secondary | ICD-10-CM | POA: Diagnosis not present

## 2020-10-08 LAB — POCT URINALYSIS DIP (CLINITEK)
Bilirubin, UA: NEGATIVE
Glucose, UA: NEGATIVE mg/dL
Ketones, POC UA: NEGATIVE mg/dL
Nitrite, UA: POSITIVE — AB
POC PROTEIN,UA: NEGATIVE
Spec Grav, UA: 1.02 (ref 1.010–1.025)
Urobilinogen, UA: 0.2 E.U./dL
pH, UA: 5.5 (ref 5.0–8.0)

## 2020-10-08 LAB — POCT UA - MICROSCOPIC ONLY

## 2020-10-08 MED ORDER — CEPHALEXIN 500 MG PO CAPS: 500.0000 mg | ORAL_CAPSULE | Freq: Four times a day (QID) | ORAL | 0 refills | Status: AC

## 2020-10-08 NOTE — Patient Instructions (Addendum)
Thank you for coming to see me today. It was a pleasure.   Urine is positive for infection.  Take Keflex 4 times a day, breakfast, lunch, dinner and before bedtime  Please schedule an appointment for Diabetic foot exam   Please schedule an appointment for PAP smear soon.  Due November 2022  Recommend Covid and Pneumonia vaccine  Please follow-up with PCP in 2 weeks  If you have any questions or concerns, please do not hesitate to call the office at [phone].  Best,   Dana Allan, MD  Back Exercises These exercises help to make your trunk and back strong. They also help to keep the lower back flexible. Doing these exercises can help to prevent or lessen pain in your lower back. If you have back pain, try to do these exercises 2-3 times each day or as told by your doctor. As you get better, do the exercises once each day. Repeat the exercises more often as told by your doctor. To stop back pain from coming back, do the exercises once each day, or as told by your doctor. Do exercises exactly as told by your doctor. Stop right away if you feel sudden pain or your pain gets worse. Exercises Single knee to chest Do these steps 3-5 times in a row for each leg: Lie on your back on a firm bed or the floor with your legs stretched out. Bring one knee to your chest. Grab your knee or thigh with both hands and hold it in place. Pull on your knee until you feel a gentle stretch in your lower back or butt. Keep doing the stretch for 10-30 seconds. Slowly let go of your leg and straighten it. Pelvic tilt Do these steps 5-10 times in a row: Lie on your back on a firm bed or the floor with your legs stretched out. Bend your knees so they point up to the ceiling. Your feet should be flat on the floor. Tighten your lower belly (abdomen) muscles to press your lower back against the floor. This will make your tailbone point up to the ceiling instead of pointing down to your feet or the  floor. Stay in this position for 5-10 seconds while you gently tighten your muscles and breathe evenly. Cat-cow Do these steps until your lower back bends more easily: Get on your hands and knees on a firm bed or the floor. Keep your hands under your shoulders, and keep your knees under your hips. You may put padding under your knees. Let your head hang down toward your chest. Tighten (contract) the muscles in your belly. Point your tailbone toward the floor so your lower back becomes rounded like the back of a cat. Stay in this position for 5 seconds. Slowly lift your head. Let the muscles of your belly relax. Point your tailbone up toward the ceiling so your back forms a sagging arch like the back of a cow. Stay in this position for 5 seconds.  Press-ups Do these steps 5-10 times in a row: Lie on your belly (face-down) on a firm bed or the floor. Place your hands near your head, about shoulder-width apart. While you keep your back relaxed and keep your hips on the floor, slowly straighten your arms to raise the top half of your body and lift your shoulders. Do not use your back muscles. You may change where you place your hands to make yourself more comfortable. Stay in this position for 5 seconds. Keep your back  relaxed. Slowly return to lying flat on the floor.  Bridges Do these steps 10 times in a row: Lie on your back on a firm bed or the floor. Bend your knees so they point up to the ceiling. Your feet should be flat on the floor. Your arms should be flat at your sides, next to your body. Tighten your butt muscles and lift your butt off the floor until your waist is almost as high as your knees. If you do not feel the muscles working in your butt and the back of your thighs, slide your feet 1-2 inches (2.5-5 cm) farther away from your butt. Stay in this position for 3-5 seconds. Slowly lower your butt to the floor, and let your butt muscles relax. If this exercise is too easy, try  doing it with your arms crossed over your chest. Belly crunches Do these steps 5-10 times in a row: Lie on your back on a firm bed or the floor with your legs stretched out. Bend your knees so they point up to the ceiling. Your feet should be flat on the floor. Cross your arms over your chest. Tip your chin a little bit toward your chest, but do not bend your neck. Tighten your belly muscles and slowly raise your chest just enough to lift your shoulder blades a tiny bit off the floor. Avoid raising your body higher than that because it can put too much stress on your lower back. Slowly lower your chest and your head to the floor. Back lifts Do these steps 5-10 times in a row: Lie on your belly (face-down) with your arms at your sides, and rest your forehead on the floor. Tighten the muscles in your legs and your butt. Slowly lift your chest off the floor while you keep your hips on the floor. Keep the back of your head in line with the curve in your back. Look at the floor while you do this. Stay in this position for 3-5 seconds. Slowly lower your chest and your face to the floor. Contact a doctor if: Your back pain gets a lot worse when you do an exercise. Your back pain does not get better within 2 hours after you exercise. If you have any of these problems, stop doing the exercises. Do not do them again unless your doctor says it is okay. Get help right away if: You have sudden, very bad back pain. If this happens, stop doing the exercises. Do not do them again unless your doctor says it is okay. This information is not intended to replace advice given to you by your health care provider. Make sure you discuss any questions you have with your health care provider. Document Revised: 04/03/2020 Document Reviewed: 04/03/2020 Elsevier Patient Education  2022 [employer].

## 2020-10-08 NOTE — Progress Notes (Signed)
    SUBJECTIVE:   CHIEF COMPLAINT / HPI: back pain  Recently treated for STI with Flagyl and Diflucan.  Did not complete full course of Flagyl.  Noted that after stopped medication started to have some lower back pain.  Denies any fevers, dysuria, vaginal discharge, abnormal vaginal bleeding or abdominal pain.  Endorses urinary frequency.  PERTINENT  PMH / PSH:  Type 2 DM HTN Class 3 Obesity [place] STI  OBJECTIVE:   BP (!) 144/111   Pulse (!) 104   Ht 5\' 7"  (1.702 m)   Wt (!) 372 lb 3.2 oz (168.8 kg)   SpO2 100%   BMI 58.29 kg/m    General: Alert, no acute distress Cardio: Normal S1 and S2, RRR, no r/m/g Pulm: CTAB, normal work of breathing Abdomen: Bowel sounds normal. Abdomen soft and non-tender. Positive CVA tenderness Extremities: No peripheral edema.  Back Exam:  Inspection: Unremarkable  Palpable tenderness: None. Range of Motion:  Flexion limited secondary to pain; Extension 45 deg; Side Bending to 45 deg bilaterally; Rotation to 45 deg bilaterally  Leg strength: Quad: 5/5 Hamstring: 5/5 Hip flexor: 5/5 Hip abductors: 5/5  Strength at foot: Plantar-flexion: 5/5 Dorsi-flexion: 5/5 Eversion: 5/5 Inversion: 5/5  Sensory change: Gross sensation intact to all lumbar and sacral dermatomes.  Reflexes: 2+ at both patellar tendons, 2+ at achilles tendons, Babinski's downgoing.  Gait unremarkable. SLR laying: Negative  XSLR laying: Negative  FABER: negative.    ASSESSMENT/PLAN:   UTI (urinary tract infection) Positive CVA tenderness.  Urine positive for Nitrites -Keflex 500 mg QID x 5 days -Follow up as needed -Strict return precautions provided  Hypertension associated with diabetes (HCC) Elevated BP.  Goal <120/80.  Patient reports financial difficulty with medications.  Has not taken medications today. -Check BP 2-3 times week and record, will review results at next visit -Consider increasing Lisinopril to 40 mg daily or adding Amlodipine 10 mg daily at next  visit -BMet, A1c, Lipid panel at next visit -Follow up in 2 weeks  Diabetes mellitus type 2 in obese (HCC) Last A1c 7.4 (06/22) -Continue Glipizide 5 mg daily -Continue Metformin 1000 mg BID -Continue Crestor 20 mg daily -Follow up in 2 weeks for diabetic and BP check     07-25-1970, MD [facility]

## 2020-10-09 ENCOUNTER — Encounter: Payer: Self-pay | Admitting: Family Medicine

## 2020-10-09 DIAGNOSIS — N390 Urinary tract infection, site not specified: Secondary | ICD-10-CM | POA: Insufficient documentation

## 2020-10-09 MED ORDER — ROSUVASTATIN CALCIUM 20 MG PO TABS: 20.0000 mg | ORAL_TABLET | Freq: Every day | ORAL | 1 refills | Status: DC

## 2020-10-09 NOTE — Assessment & Plan Note (Signed)
Last A1c 7.4 (06/22) -Continue Glipizide 5 mg daily -Continue Metformin 1000 mg BID -Continue Crestor 20 mg daily -Follow up in 2 weeks for diabetic and BP check

## 2020-10-09 NOTE — Assessment & Plan Note (Signed)
Positive CVA tenderness.  Urine positive for Nitrites -Keflex 500 mg QID x 5 days -Follow up as needed -Strict return precautions provided

## 2020-10-09 NOTE — Assessment & Plan Note (Addendum)
Elevated BP.  Goal <120/80.  Patient reports financial difficulty with medications.  Has not taken medications today. -Check BP 2-3 times week and record, will review results at next visit -Consider increasing Lisinopril to 40 mg daily or adding Amlodipine 10 mg daily at next visit -BMet, A1c, Lipid panel at next visit -Follow up in 2 weeks

## 2020-10-14 ENCOUNTER — Encounter: Payer: Self-pay | Admitting: Family Medicine

## 2020-10-15 NOTE — Telephone Encounter (Signed)
Patient needs to be seen by a provider.  Lisa Allan, MD [facility]

## 2020-10-23 ENCOUNTER — Ambulatory Visit (INDEPENDENT_AMBULATORY_CARE_PROVIDER_SITE_OTHER): Payer: 59

## 2020-10-23 ENCOUNTER — Other Ambulatory Visit: Payer: Self-pay

## 2020-10-23 ENCOUNTER — Encounter: Payer: Self-pay | Admitting: Family Medicine

## 2020-10-23 ENCOUNTER — Ambulatory Visit (INDEPENDENT_AMBULATORY_CARE_PROVIDER_SITE_OTHER): Payer: 59 | Admitting: Family Medicine

## 2020-10-23 VITALS — HR 125 | Ht 67.0 in | Wt 369.2 lb

## 2020-10-23 DIAGNOSIS — E669 Obesity, unspecified: Secondary | ICD-10-CM | POA: Diagnosis not present

## 2020-10-23 DIAGNOSIS — I152 Hypertension secondary to endocrine disorders: Secondary | ICD-10-CM

## 2020-10-23 DIAGNOSIS — M545 Low back pain, unspecified: Secondary | ICD-10-CM

## 2020-10-23 DIAGNOSIS — Z23 Encounter for immunization: Secondary | ICD-10-CM

## 2020-10-23 DIAGNOSIS — M5410 Radiculopathy, site unspecified: Secondary | ICD-10-CM | POA: Diagnosis not present

## 2020-10-23 DIAGNOSIS — E119 Type 2 diabetes mellitus without complications: Secondary | ICD-10-CM | POA: Diagnosis not present

## 2020-10-23 DIAGNOSIS — E1159 Type 2 diabetes mellitus with other circulatory complications: Secondary | ICD-10-CM

## 2020-10-23 DIAGNOSIS — E1169 Type 2 diabetes mellitus with other specified complication: Secondary | ICD-10-CM | POA: Diagnosis not present

## 2020-10-23 DIAGNOSIS — G8929 Other chronic pain: Secondary | ICD-10-CM

## 2020-10-23 LAB — POCT GLYCOSYLATED HEMOGLOBIN (HGB A1C): HbA1c, POC (controlled diabetic range): 8.3 % — AB (ref 0.0–7.0)

## 2020-10-23 MED ORDER — ACETAMINOPHEN ER 650 MG PO TBCR: 650.0000 mg | EXTENDED_RELEASE_TABLET | Freq: Three times a day (TID) | ORAL | 0 refills | Status: AC | PRN

## 2020-10-23 MED ORDER — KETOROLAC TROMETHAMINE 30 MG/ML IJ SOLN
30.0000 mg | Freq: Once | INTRAMUSCULAR | Status: AC
  Administered 2020-10-23: 30 mg via INTRAMUSCULAR

## 2020-10-23 MED ORDER — NAPROXEN 500 MG PO TABS: 500.0000 mg | ORAL_TABLET | Freq: Two times a day (BID) | ORAL | 0 refills | Status: DC

## 2020-10-23 MED ORDER — CYCLOBENZAPRINE HCL 10 MG PO TABS: 10.0000 mg | ORAL_TABLET | Freq: Every day | ORAL | 0 refills | Status: DC
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
## 2020-10-23 NOTE — Patient Instructions (Addendum)
Thank you for coming to see me today.   We will get some labs today.  If they are abnormal or we need to do something about them, I will call you.  If they are normal, I will send you a message on MyChart (if it is active) or a letter in the mail.  If you don't hear from [place] in 2 weeks, please call the office at the number below.   Recommend COVID, Flu vaccines  PAP due in November  Please follow-up with PCP as needed  If you have any questions or concerns, please do not hesitate to call the office at [phone].  Best,   Dana Allan, MD    Back Exercises These exercises help to make your trunk and back strong. They also help to keep the lower back flexible. Doing these exercises can help to prevent or lessen pain in your lower back. If you have back pain, try to do these exercises 2-3 times each day or as told by your doctor. As you get better, do the exercises once each day. Repeat the exercises more often as told by your doctor. To stop back pain from coming back, do the exercises once each day, or as told by your doctor. Do exercises exactly as told by your doctor. Stop right away if you feel sudden pain or your pain gets worse. Exercises Single knee to chest Do these steps 3-5 times in a row for each leg: Lie on your back on a firm bed or the floor with your legs stretched out. Bring one knee to your chest. Grab your knee or thigh with both hands and hold it in place. Pull on your knee until you feel a gentle stretch in your lower back or butt. Keep doing the stretch for 10-30 seconds. Slowly let go of your leg and straighten it. Pelvic tilt Do these steps 5-10 times in a row: Lie on your back on a firm bed or the floor with your legs stretched out. Bend your knees so they point up to the ceiling. Your feet should be flat on the floor. Tighten your lower belly (abdomen) muscles to press your lower back against the floor. This will make your tailbone point up to the ceiling  instead of pointing down to your feet or the floor. Stay in this position for 5-10 seconds while you gently tighten your muscles and breathe evenly. Cat-cow Do these steps until your lower back bends more easily: Get on your hands and knees on a firm bed or the floor. Keep your hands under your shoulders, and keep your knees under your hips. You may put padding under your knees. Let your head hang down toward your chest. Tighten (contract) the muscles in your belly. Point your tailbone toward the floor so your lower back becomes rounded like the back of a cat. Stay in this position for 5 seconds. Slowly lift your head. Let the muscles of your belly relax. Point your tailbone up toward the ceiling so your back forms a sagging arch like the back of a cow. Stay in this position for 5 seconds.  Press-ups Do these steps 5-10 times in a row: Lie on your belly (face-down) on a firm bed or the floor. Place your hands near your head, about shoulder-width apart. While you keep your back relaxed and keep your hips on the floor, slowly straighten your arms to raise the top half of your body and lift your shoulders. Do not use your  back muscles. You may change where you place your hands to make yourself more comfortable. Stay in this position for 5 seconds. Keep your back relaxed. Slowly return to lying flat on the floor.  Bridges Do these steps 10 times in a row: Lie on your back on a firm bed or the floor. Bend your knees so they point up to the ceiling. Your feet should be flat on the floor. Your arms should be flat at your sides, next to your body. Tighten your butt muscles and lift your butt off the floor until your waist is almost as high as your knees. If you do not feel the muscles working in your butt and the back of your thighs, slide your feet 1-2 inches (2.5-5 cm) farther away from your butt. Stay in this position for 3-5 seconds. Slowly lower your butt to the floor, and let your butt muscles  relax. If this exercise is too easy, try doing it with your arms crossed over your chest. Belly crunches Do these steps 5-10 times in a row: Lie on your back on a firm bed or the floor with your legs stretched out. Bend your knees so they point up to the ceiling. Your feet should be flat on the floor. Cross your arms over your chest. Tip your chin a little bit toward your chest, but do not bend your neck. Tighten your belly muscles and slowly raise your chest just enough to lift your shoulder blades a tiny bit off the floor. Avoid raising your body higher than that because it can put too much stress on your lower back. Slowly lower your chest and your head to the floor. Back lifts Do these steps 5-10 times in a row: Lie on your belly (face-down) with your arms at your sides, and rest your forehead on the floor. Tighten the muscles in your legs and your butt. Slowly lift your chest off the floor while you keep your hips on the floor. Keep the back of your head in line with the curve in your back. Look at the floor while you do this. Stay in this position for 3-5 seconds. Slowly lower your chest and your face to the floor. Contact a doctor if: Your back pain gets a lot worse when you do an exercise. Your back pain does not get better within 2 hours after you exercise. If you have any of these problems, stop doing the exercises. Do not do them again unless your doctor says it is okay. Get help right away if: You have sudden, very bad back pain. If this happens, stop doing the exercises. Do not do them again unless your doctor says it is okay. This information is not intended to replace advice given to you by your health care provider. Make sure you discuss any questions you have with your health care provider. Document Revised: 04/03/2020 Document Reviewed: 04/03/2020 Elsevier Patient Education  2022 [employer].

## 2020-10-24 ENCOUNTER — Other Ambulatory Visit: Payer: Self-pay | Admitting: Family Medicine

## 2020-10-24 ENCOUNTER — Encounter: Payer: Self-pay | Admitting: Family Medicine

## 2020-10-24 DIAGNOSIS — E119 Type 2 diabetes mellitus without complications: Secondary | ICD-10-CM

## 2020-10-25 ENCOUNTER — Encounter: Payer: Self-pay | Admitting: Family Medicine

## 2020-10-25 ENCOUNTER — Other Ambulatory Visit: Payer: Self-pay | Admitting: Family Medicine

## 2020-10-25 DIAGNOSIS — M545 Low back pain, unspecified: Secondary | ICD-10-CM | POA: Insufficient documentation

## 2020-10-25 MED ORDER — LISINOPRIL 20 MG PO TABS: 20.0000 mg | ORAL_TABLET | Freq: Every day | ORAL | 5 refills | Status: DC

## 2020-10-25 MED ORDER — DULOXETINE HCL 30 MG PO CPEP: 30.0000 mg | ORAL_CAPSULE | Freq: Every day | ORAL | 0 refills | Status: DC

## 2020-10-25 MED ORDER — GLIPIZIDE 5 MG PO TABS: 5.0000 mg | ORAL_TABLET | Freq: Every day | ORAL | 5 refills | Status: AC
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
## 2020-10-25 NOTE — Assessment & Plan Note (Signed)
Not at goal <120/80.  Had not taken medication today. Last Cr 06/22 and wnl Continue Lisinopril 20 mg daily Repeat Bmet at next visit Follow up in 2 weeks

## 2020-10-25 NOTE — Assessment & Plan Note (Signed)
Last A1c 7.4 (07/22) Repeat A1c today 8.3.  Has not been compliant with medications secondary to finances. Sometimes take 3gm Metformin a day -Restart Metformin 1000 mg BID -Restart Glipizide 5 mg daily -Restart Crestor 20 mg daily -Refer to Ophthalmology for diabetic eye exam -Diabetic Foot exam today -Repeat A1c in 3 months -Would like to start on GLP1 so will refer to pharmacy for financial assistance -Follow up in 2 weeks

## 2020-10-30 ENCOUNTER — Emergency Department (HOSPITAL_COMMUNITY): Payer: 59

## 2020-10-30 ENCOUNTER — Emergency Department (HOSPITAL_COMMUNITY)
Admission: EM | Admit: 2020-10-30 | Discharge: 2020-10-30 | Disposition: A | Discharge status: Home / Self Care | Payer: 59 | Attending: Emergency Medicine | Admitting: Emergency Medicine

## 2020-10-30 ENCOUNTER — Other Ambulatory Visit: Payer: Self-pay

## 2020-10-30 DIAGNOSIS — Z87891 Personal history of nicotine dependence: Secondary | ICD-10-CM | POA: Diagnosis not present

## 2020-10-30 DIAGNOSIS — G8929 Other chronic pain: Secondary | ICD-10-CM | POA: Diagnosis not present

## 2020-10-30 DIAGNOSIS — M545 Low back pain, unspecified: Secondary | ICD-10-CM | POA: Diagnosis present

## 2020-10-30 DIAGNOSIS — E1169 Type 2 diabetes mellitus with other specified complication: Secondary | ICD-10-CM | POA: Diagnosis not present

## 2020-10-30 DIAGNOSIS — Z7984 Long term (current) use of oral hypoglycemic drugs: Secondary | ICD-10-CM | POA: Diagnosis not present

## 2020-10-30 DIAGNOSIS — Z79899 Other long term (current) drug therapy: Secondary | ICD-10-CM | POA: Insufficient documentation

## 2020-10-30 DIAGNOSIS — I10 Essential (primary) hypertension: Secondary | ICD-10-CM | POA: Insufficient documentation

## 2020-10-30 LAB — POC URINE PREG, ED: Preg Test, Ur: NEGATIVE

## 2020-10-30 MED ORDER — IBUPROFEN 600 MG PO TABS: 600.0000 mg | ORAL_TABLET | Freq: Four times a day (QID) | ORAL | 0 refills | Status: DC | PRN

## 2020-10-30 MED ORDER — IBUPROFEN 800 MG PO TABS
800.0000 mg | ORAL_TABLET | Freq: Once | ORAL | Status: AC
  Administered 2020-10-30: 800 mg via ORAL
  Filled 2020-10-30: qty 1

## 2020-10-30 MED ORDER — METHOCARBAMOL 500 MG PO TABS: 500.0000 mg | ORAL_TABLET | Freq: Two times a day (BID) | ORAL | 0 refills | Status: DC

## 2020-10-30 MED ORDER — LISINOPRIL 20 MG PO TABS
20.0000 mg | ORAL_TABLET | Freq: Once | ORAL | Status: AC
  Administered 2020-10-30: 20 mg via ORAL
  Filled 2020-10-30: qty 1
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
## 2020-10-30 NOTE — Discharge Instructions (Addendum)
Please call and follow up closely with orthopedist for further evaluation and management of your recurrent back pain.  Your xray today did not show any concerning finding.  Take antiinflammatory medication and muscle relaxant as needed for pain.

## 2020-10-30 NOTE — ED Provider Notes (Signed)
[facility] EMERGENCY DEPARTMENT Provider Note   CSN: [id number] Arrival date & time: 10/30/20  1045     History Chief Complaint  Patient presents with   Back Pain    Lisa Cooley is a 35 y.o. female.  The history is provided by the patient and medical records. No language interpreter was used.  Back Pain  35 year old female significant history of obesity, diabetes, hypertension who presents for evaluation of back pain.  Patient report that she has been experiencing recurrent lower back pain ongoing for at least 2 years.  Pain seems to be worsened with movement and sometimes pain radiates down to both of her legs towards her thigh.  Pain has been waxing waning but seems to be increased in severity in the past several months.  No associated fever or chills no abdominal pain no bowel bladder incontinence or saddle anesthesia.  No history of IV drug use or active cancer.  She has been seen several times for her back pain and states she was given medication such as muscle relaxant, and anti-inflammatory medication that initially did help but lately it has not provided much relief.  She is concerned that she may have some underlying problem with her back and has not been fully evaluated.  Pain is currently moderate in severity and affecting her work as a [employer].  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  She denies any focal numbness or focal weakness.  Patient states she is currently not pregnant.  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   Screen for STD (sexually transmitted disease) 09/17/2017   Diabetes mellitus type 2 in obese ([place]) 12/31/2016   Epigastric pain 08/13/2011   DM2 (diabetes mellitus, type 2) ([place]) 08/12/2011   Morbid obesity ([place]) 08/12/2011   Infertility, female 08/12/2011   Irregular periods 08/12/2011    Past Surgical History:  Procedure Laterality Date   NO PAST SURGERIES       OB History     Gravida  0   Para  0   Term  0   Preterm      AB  0   Living  0      SAB  0   IAB      Ectopic      Multiple      Live Births              Family History  Problem Relation Age of Onset   Cancer Father    Heart disease Sister    Other Neg Hx     Social History   Tobacco Use   Smoking status: Former    Packs/day: 0.25    Types: Cigarettes    Quit date: 04/20/2014    Years since quitting:  6.5   Smokeless tobacco: Never  Substance Use Topics   Alcohol use: No    Alcohol/week: 0.0 standard drinks   Drug use: Yes    Types: Marijuana    Comment: last use 2 days ago    Home Medications Prior to Admission medications   Medication Sig Start Date End Date Taking? Authorizing Provider  acetaminophen (TYLENOL 8 HOUR) 650 MG CR tablet Take 1 tablet (650 mg total) by mouth every 8 (eight) hours as needed for up to 14 days for pain. 10/23/20 11/06/20  Carollee Leitz, MD  blood glucose meter kit and supplies KIT Dispense based on patient and insurance preference. Use up to four times daily as directed. (FOR ICD-9 250.00, 250.01). 09/01/19   Carollee Leitz, MD  cyclobenzaprine (FLEXERIL) 10 MG tablet Take 1 tablet (10 mg total) by mouth at bedtime. 10/23/20   Carollee Leitz, MD  DULoxetine (CYMBALTA) 30 MG capsule Take 1  capsule (30 mg total) by mouth daily. 10/25/20   Carollee Leitz, MD  fluticasone (FLONASE) 50 MCG/ACT nasal spray Place 2 sprays into both nostrils daily. 08/28/20   Hughie Closs, PA-C  glipiZIDE (GLUCOTROL) 5 MG tablet Take 1 tablet (5 mg total) by mouth daily before breakfast. 10/25/20   Carollee Leitz, MD  glucose blood test strip Use as instructed 09/19/19   Zenia Resides, MD  ibuprofen (ADVIL) 600 MG tablet Take 1 tablet (600 mg total) by mouth every 6 (six) hours as needed. 06/29/20   Domenic Moras, PA-C  Lancet Devices MISC 1 Container by Does not apply route 2 (two) times daily as needed. As directed. 09/19/19   Zenia Resides, MD  lisinopril (ZESTRIL) 20 MG tablet Take 1 tablet (20 mg total) by mouth daily. 10/25/20   Carollee Leitz, MD  metFORMIN (GLUCOPHAGE) 1000 MG tablet TAKE 1 TABLET BY MOUTH TWICE DAILY WITH MEALS 10/25/20   Carollee Leitz, MD  naproxen (NAPROSYN) 500 MG tablet Take 1 tablet (500 mg total) by mouth 2 (two) times daily with a meal. 10/23/20   Carollee Leitz, MD  rosuvastatin (CRESTOR) 20 MG tablet Take 1 tablet (20 mg total) by mouth daily. 10/09/20   Carollee Leitz, MD  albuterol (VENTOLIN HFA) 108 (90 Base) MCG/ACT inhaler Inhale 2 puffs into the lungs every 6 (six) hours as needed for wheezing or shortness of breath. 08/30/19 09/13/19  Carollee Leitz, MD    Allergies    Patient has no known allergies.  Review of Systems   Review of Systems  Musculoskeletal:  Positive for back pain.  All other systems reviewed and are negative.  Physical Exam Updated Vital Signs BP (!) 190/128 (BP Location: Left Wrist)   Pulse 89   Temp 98.5 F (36.9 C) (Oral)   Resp 15   LMP 10/01/2020 (Approximate)   SpO2 100%   Physical Exam Vitals and nursing note reviewed.  Constitutional:      General: She is not in acute distress.    Appearance: She is well-developed. She is obese.     Comments: Morbidly obese patient sitting the bed appears to be in no acute discomfort.  HENT:     Head:  Atraumatic.  Eyes:     Conjunctiva/sclera: Conjunctivae normal.  Pulmonary:     Effort: Pulmonary effort is normal.  Abdominal:     Palpations: Abdomen is soft.     Tenderness: There is no abdominal tenderness.  Musculoskeletal:        General: Tenderness (Tenderness along lumbar and paralumbar spinal muscle  on exam with normal straight leg raise bilaterally) present.     Cervical back: Neck supple.  Skin:    Capillary Refill: Capillary refill takes less than 2 seconds.     Findings: No rash.  Neurological:     Mental Status: She is alert. Mental status is at baseline.     Sensory: No sensory deficit.     Comments: Able to ambulate  Psychiatric:        Mood and Affect: Mood normal.    ED Results / Procedures / Treatments   Labs (all labs ordered are listed, but only abnormal results are displayed) Labs Reviewed  POC URINE PREG, ED    EKG None  Radiology DG Lumbar Spine Complete  Result Date: 10/30/2020 CLINICAL DATA:  Chronic back pain EXAM: LUMBAR SPINE - COMPLETE 4+ VIEW COMPARISON:  None. FINDINGS: 5 nonrib bearing lumbar-type vertebral bodies. Vertebral body heights are maintained. No acute fracture. No static listhesis. No spondylolysis. Degenerative disease with disc height loss at L5-S1. SI joints are unremarkable. IMPRESSION: No acute osseous injury of the lumbar spine. Electronically Signed   By: Kathreen Devoid M.D.   On: 10/30/2020 13:57    Procedures Procedures   Medications Ordered in ED Medications  ibuprofen (ADVIL) tablet 800 mg (has no administration in time range)  lisinopril (ZESTRIL) tablet 20 mg (20 mg Oral Given 10/30/20 1149)    ED Course  I have reviewed the triage vital signs and the nursing notes.  Pertinent labs & imaging results that were available during my care of the patient were reviewed by me and considered in my medical decision making (see chart for details).    MDM Rules/Calculators/A&P                           BP (!) 165/87    Pulse 83   Temp 98.5 F (36.9 C) (Oral)   Resp 19   LMP 10/01/2020 (Approximate)   SpO2 95%  The patient was noted to be hypertensive today in the emergency department. I have spoken with the patient regarding hypertension and the need for improved management. I instructed the patient to followup with the Primary care doctor within 4 days to improve the management of the patient's hypertension. I also counseled the patient regarding the signs and symptoms which would require an emergent visit to an emergency department for hypertensive urgency and/or hypertensive emergency. The patient understood the need for improved hypertensive management.   Final Clinical Impression(s) / ED Diagnoses Final diagnoses:  Chronic bilateral low back pain, unspecified whether sciatica present    Rx / DC Orders ED Discharge Orders          Ordered    ibuprofen (ADVIL) 600 MG tablet  Every 6 hours PRN        10/30/20 1410    methocarbamol (ROBAXIN) 500 MG tablet  2 times daily        10/30/20 1410           11:32 AM Patient here with acute on chronic lower back pain.  It is likely traumatic but patient did recall due to heavy lifting several weeks prior.  She reports she has never had any advanced imaging of her lower back.  It would be reasonable to obtain a screening lumbar spine x-ray during this visit.  Patient reports she has been taking anti-inflammatory medication as well as gabapentin without adequate relief.  Suspect better weight management would likely improve  her back pain.  2:09 PM Xray of Lspine without acute finding.  Will prescribe NSAIDs, muscle relaxant and outpt f/u with orthopedist.    Also recommend pt to f/u for BP recheck as it was elevated today.  Doubt dissection or other emergent medical condition during this visit.    Domenic Moras, PA-C 10/30/20 1415    Lucrezia Starch, MD 10/31/20 1050

## 2020-11-14 ENCOUNTER — Other Ambulatory Visit: Payer: Self-pay

## 2020-11-14 ENCOUNTER — Ambulatory Visit (INDEPENDENT_AMBULATORY_CARE_PROVIDER_SITE_OTHER): Payer: 59

## 2020-11-14 DIAGNOSIS — Z23 Encounter for immunization: Secondary | ICD-10-CM

## 2020-11-27 ENCOUNTER — Other Ambulatory Visit: Payer: Self-pay | Admitting: Family Medicine

## 2020-11-27 ENCOUNTER — Encounter: Payer: Self-pay | Admitting: Family Medicine

## 2020-11-28 ENCOUNTER — Other Ambulatory Visit: Payer: Self-pay | Admitting: Family Medicine

## 2020-11-28 DIAGNOSIS — R058 Other specified cough: Secondary | ICD-10-CM

## 2020-11-28 MED ORDER — NAPROXEN 500 MG PO TABS: 500.0000 mg | ORAL_TABLET | Freq: Two times a day (BID) | ORAL | 1 refills | Status: DC | PRN

## 2020-11-28 MED ORDER — BENZONATATE 100 MG PO CAPS: 100.0000 mg | ORAL_CAPSULE | Freq: Two times a day (BID) | ORAL | 0 refills | Status: DC | PRN

## 2020-11-28 MED ORDER — CYCLOBENZAPRINE HCL 10 MG PO TABS: 5.0000 mg | ORAL_TABLET | Freq: Every day | ORAL | 0 refills | Status: DC

## 2021-01-01 ENCOUNTER — Ambulatory Visit (INDEPENDENT_AMBULATORY_CARE_PROVIDER_SITE_OTHER): Payer: 59 | Admitting: Family Medicine

## 2021-01-01 ENCOUNTER — Other Ambulatory Visit: Payer: Self-pay

## 2021-01-01 VITALS — BP 182/113 | HR 92 | Wt 374.4 lb

## 2021-01-01 DIAGNOSIS — E1159 Type 2 diabetes mellitus with other circulatory complications: Secondary | ICD-10-CM

## 2021-01-01 DIAGNOSIS — I152 Hypertension secondary to endocrine disorders: Secondary | ICD-10-CM

## 2021-01-01 DIAGNOSIS — M792 Neuralgia and neuritis, unspecified: Secondary | ICD-10-CM

## 2021-01-01 MED ORDER — GABAPENTIN 300 MG PO CAPS: 600.0000 mg | ORAL_CAPSULE | Freq: Two times a day (BID) | ORAL | 3 refills | Status: DC

## 2021-01-01 NOTE — Patient Instructions (Signed)
Thank you for coming in today.  Today we discussed left leg pain.  This is likely due to neuropathic pain from diabetes as well as sciatic nerve pain.  I have increased your gabapentin and we will check your vitamin B12 levels today.  If this continues to occur please follow-up with your primary care doctor.  Dr. Salvadore Dom

## 2021-01-01 NOTE — Progress Notes (Signed)
    SUBJECTIVE:   CHIEF COMPLAINT / HPI:   [name] is a 35 yo F who presents for the below.   Left leg pain Chronic pain. Located at back of left buttock and radiates down her leg. It is worse with sitting and will sometimes "shock" her intermittently with standing from a seated position. She has been taking gabapentin 300 mg 3x/daily with some relief. She also admits to taking cymbalta as prescribed. She denies any issues with bowel or bladder.   PERTINENT  PMH / PSH: HTN, T2DM, Morbid obesity  OBJECTIVE:   BP (!) 182/113   Pulse 92   Wt (!) 374 lb 6.4 oz (169.8 kg)   SpO2 97%   BMI 58.64 kg/m   General: Appears well, no acute distress. Age appropriate. Respiratory: normal effort Extremities: No edema or cyanosis. Pulses 2+ and equal Skin: Warm and dry, no LE venous stasis changes Neuro: alert and oriented.  CN II: PERRL CN III, IV,VI: EOMI CV V: Normal sensation in V1, V2, V3 CVII: Symmetric smile and brow raise CN VIII: Normal hearing CN IX,X: Symmetric palate raise  CN XI: 5/5 shoulder shrug CN XII: Symmetric tongue protrusion  UE and LE strength 5/5 2+ UE and LE reflexes  Normal sensation in UE and LE bilaterally  Normal gait  Psych: normal affect   ASSESSMENT/PLAN:   1. Neuropathic pain, leg, left Chronic hx of low back pain. Reviewed PCP note 10/2020 and was started on NSAID, duloxetine, and given exercises. Weight loss was encouraged at the time (which we discussed briefly today). Today continues to have same issues. Sx consistent with neuropathic nerve pain. Will increase gabapentin today and obtain B 12. Likely multifactorial with diabetes states as well as increased weight. Encouraged PCP follow up for further management.  - Vitamin B12 - gabapentin (NEURONTIN) 300 MG capsule; Take 2 capsules (600 mg total) by mouth 2 (two) times daily.  Dispense: 90 capsule; Refill: 3  2. Hypertension associated with diabetes ([facility]) Significantly elevated today. Did not  get to address directly today. Reviewed prior notes and pressures post visit and patient does not seem to be compliant with medications. Likely needs continued follow up to cultivate compliance. Will send notification to PCP.   Lisa Jumbo, DO [facility]

## 2021-01-02 ENCOUNTER — Encounter: Payer: Self-pay | Admitting: Family Medicine

## 2021-01-02 ENCOUNTER — Ambulatory Visit: Payer: Self-pay | Admitting: *Deleted

## 2021-01-02 LAB — VITAMIN B12: Vitamin B-12: 662 pg/mL (ref 232–1245)

## 2021-01-02 NOTE — Telephone Encounter (Signed)
C/o pain in left posterior thigh x 2-3 weeks now constant pain like a "charlie horse". Pain with sitting, standing , bending. Coughing can cause pain in posterior thigh. Seen by PCP and was recommended to increase dose of gabapentin to 300 mg . Reports increase dose has not helped with pain level. Recommended to try E visit , UC or ED . Care advise given. Patient verbalized understanding of care advise and to call PCP as needed.

## 2021-01-02 NOTE — Telephone Encounter (Signed)
Reason for Disposition  [1] SEVERE pain (e.g., excruciating, unable to do any normal activities) AND [2] not improved after 2 hours of pain medicine  Answer Assessment - Initial Assessment Questions 1. ONSET: "When did the pain start?"      2-3 weeks ago  2. LOCATION: "Where is the pain located?"      Left posterior thigh 3. PAIN: "How bad is the pain?"    (Scale 1-10; or mild, moderate, severe)   -  MILD (1-3): doesn't interfere with normal activities    -  MODERATE (4-7): interferes with normal activities (e.g., work or school) or awakens from sleep, limping    -  SEVERE (8-10): excruciating pain, unable to do any normal activities, unable to walk     Severe at times esp when bending , standing  and sitting in car 4. WORK OR EXERCISE: "Has there been any recent work or exercise that involved this part of the body?"      na 5. CAUSE: "What do you think is causing the leg pain?"     Not sure  6. OTHER SYMPTOMS: "Do you have any other symptoms?" (e.g., chest pain, back pain, breathing difficulty, swelling, rash, fever, numbness, weakness)     Coughing causes worsen pain in back of thigh, feels like constant "charlie horse" 7. PREGNANCY: "Is there any chance you are pregnant?" "When was your last menstrual period?"     na  Protocols used: Leg Pain-A-AH

## 2021-01-04 ENCOUNTER — Other Ambulatory Visit: Payer: Self-pay

## 2021-01-04 ENCOUNTER — Emergency Department (HOSPITAL_COMMUNITY)
Admission: EM | Admit: 2021-01-04 | Discharge: 2021-01-04 | Disposition: A | Discharge status: Home / Self Care | Payer: 59 | Attending: Emergency Medicine | Admitting: Emergency Medicine

## 2021-01-04 ENCOUNTER — Emergency Department (HOSPITAL_COMMUNITY): Payer: 59

## 2021-01-04 DIAGNOSIS — Z87891 Personal history of nicotine dependence: Secondary | ICD-10-CM | POA: Diagnosis not present

## 2021-01-04 DIAGNOSIS — M79652 Pain in left thigh: Secondary | ICD-10-CM | POA: Insufficient documentation

## 2021-01-04 DIAGNOSIS — M545 Low back pain, unspecified: Secondary | ICD-10-CM | POA: Diagnosis not present

## 2021-01-04 DIAGNOSIS — Z79899 Other long term (current) drug therapy: Secondary | ICD-10-CM | POA: Insufficient documentation

## 2021-01-04 DIAGNOSIS — M79605 Pain in left leg: Secondary | ICD-10-CM | POA: Diagnosis not present

## 2021-01-04 DIAGNOSIS — G8929 Other chronic pain: Secondary | ICD-10-CM | POA: Diagnosis not present

## 2021-01-04 DIAGNOSIS — E119 Type 2 diabetes mellitus without complications: Secondary | ICD-10-CM | POA: Diagnosis not present

## 2021-01-04 DIAGNOSIS — I10 Essential (primary) hypertension: Secondary | ICD-10-CM | POA: Diagnosis not present

## 2021-01-04 DIAGNOSIS — Z7984 Long term (current) use of oral hypoglycemic drugs: Secondary | ICD-10-CM | POA: Diagnosis not present

## 2021-01-04 LAB — BASIC METABOLIC PANEL
Anion gap: 7 (ref 5–15)
BUN: 10 mg/dL (ref 6–20)
CO2: 25 mmol/L (ref 22–32)
Calcium: 8.7 mg/dL — ABNORMAL LOW (ref 8.9–10.3)
Chloride: 103 mmol/L (ref 98–111)
Creatinine, Ser: 0.66 mg/dL (ref 0.44–1.00)
GFR, Estimated: >60 mL/min (ref >60)
Glucose, Bld: 142 mg/dL — ABNORMAL HIGH (ref 70–99)
Potassium: 3.7 mmol/L (ref 3.5–5.1)
Sodium: 135 mmol/L (ref 135–145)

## 2021-01-04 MED ORDER — METHOCARBAMOL 500 MG PO TABS: 500.0000 mg | ORAL_TABLET | Freq: Two times a day (BID) | ORAL | 0 refills | Status: DC

## 2021-01-04 MED ORDER — KETOROLAC TROMETHAMINE 60 MG/2ML IM SOLN
30.0000 mg | Freq: Once | INTRAMUSCULAR | Status: AC
  Administered 2021-01-04: 30 mg via INTRAMUSCULAR
  Filled 2021-01-04: qty 2

## 2021-01-04 MED ORDER — LIDOCAINE 4 % EX PTCH: 1.0000 | MEDICATED_PATCH | Freq: Every day | CUTANEOUS | 0 refills | Status: DC | PRN

## 2021-01-04 NOTE — ED Triage Notes (Signed)
Patient reports she always has achy legs but has gotten worse lately. Says she gets shooting pain from back of leg up toward buttock. Patient says she went to pcp and was told it was nerve pain but pcp  did not give patient anything. Patient says it mostly affects left leg but went to right leg last night. Pain rated 8/10

## 2021-01-04 NOTE — Discharge Instructions (Addendum)
You were seen here today for evaluation of your chronic left leg pain. Your XR showed some narrowing disc space which is likely causing radiculopathy to your legs. You will need to follow up with your PCP for further imaging. I have prescribed you Robaxin, a muscles relaxer, to take as needed. Do not take this medication if you are going to drive, operate heavy machinery, or if there is a chance you are pregnant. Additionally, I have prescribed you topical lidocaine patches to apply to your lower back. If you develop any weakness in the leg, color changes, urinary retention, or incontinence, please return to the nearest ER for evaluation.

## 2021-01-04 NOTE — ED Notes (Signed)
Unable to collect labs patient is in ray
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
## 2021-01-04 NOTE — ED Notes (Signed)
Paient has a extra lav tube in main lab

## 2021-01-07 NOTE — Telephone Encounter (Signed)
Late entry*   Spoke with patient yesterday and discussed increase in medications to help with discomfort.   Patient reports increasing gabapentin and continuing duloxetine is not "really helping."  Patient scheduled in ATC for 12/8.

## 2021-01-09 ENCOUNTER — Emergency Department (HOSPITAL_COMMUNITY): Payer: 59

## 2021-01-09 ENCOUNTER — Other Ambulatory Visit: Payer: Self-pay

## 2021-01-09 ENCOUNTER — Ambulatory Visit: Payer: 59

## 2021-01-09 ENCOUNTER — Emergency Department (HOSPITAL_COMMUNITY)
Admission: EM | Admit: 2021-01-09 | Discharge: 2021-01-09 | Disposition: A | Discharge status: Home / Self Care | Payer: 59 | Attending: Emergency Medicine | Admitting: Emergency Medicine

## 2021-01-09 DIAGNOSIS — E119 Type 2 diabetes mellitus without complications: Secondary | ICD-10-CM | POA: Insufficient documentation

## 2021-01-09 DIAGNOSIS — M5417 Radiculopathy, lumbosacral region: Secondary | ICD-10-CM | POA: Insufficient documentation

## 2021-01-09 DIAGNOSIS — Z87891 Personal history of nicotine dependence: Secondary | ICD-10-CM | POA: Diagnosis not present

## 2021-01-09 DIAGNOSIS — I10 Essential (primary) hypertension: Secondary | ICD-10-CM | POA: Diagnosis not present

## 2021-01-09 DIAGNOSIS — Z79899 Other long term (current) drug therapy: Secondary | ICD-10-CM | POA: Insufficient documentation

## 2021-01-09 DIAGNOSIS — Z7984 Long term (current) use of oral hypoglycemic drugs: Secondary | ICD-10-CM | POA: Insufficient documentation

## 2021-01-09 DIAGNOSIS — M549 Dorsalgia, unspecified: Secondary | ICD-10-CM | POA: Diagnosis present

## 2021-01-09 LAB — CBG MONITORING, ED: Glucose-Capillary: 160 mg/dL — ABNORMAL HIGH (ref 70–99)

## 2021-01-09 MED ORDER — PREDNISONE 20 MG PO TABS: 40.0000 mg | ORAL_TABLET | Freq: Every day | ORAL | 0 refills | Status: DC

## 2021-01-09 MED ORDER — TIZANIDINE HCL 4 MG PO TABS
2.0000 mg | ORAL_TABLET | Freq: Once | ORAL | Status: AC
  Administered 2021-01-09: 2 mg via ORAL
  Filled 2021-01-09: qty 1

## 2021-01-09 MED ORDER — DEXAMETHASONE SODIUM PHOSPHATE 10 MG/ML IJ SOLN
10.0000 mg | Freq: Once | INTRAMUSCULAR | Status: AC
  Administered 2021-01-09: 10 mg via INTRAMUSCULAR
  Filled 2021-01-09: qty 1

## 2021-01-09 MED ORDER — HYDROCODONE-ACETAMINOPHEN 5-325 MG PO TABS
1.0000 | ORAL_TABLET | Freq: Once | ORAL | Status: AC
  Administered 2021-01-09: 1 via ORAL
  Filled 2021-01-09: qty 1

## 2021-01-09 MED ORDER — HYDROCODONE-ACETAMINOPHEN 5-325 MG PO TABS: 1.0000 | ORAL_TABLET | Freq: Three times a day (TID) | ORAL | 0 refills | Status: DC | PRN

## 2021-01-09 MED ORDER — TIZANIDINE HCL 4 MG PO TABS
4.0000 mg | ORAL_TABLET | Freq: Four times a day (QID) | ORAL | 0 refills | Status: DC | PRN
Start: 2021-01-09 — End: 2021-04-09

## 2021-01-09 NOTE — Patient Instructions (Incomplete)
Thank you for coming to see me today. It was a pleasure.     You are due for your PAP smear.  Please schedule an appointment for this at your earliest convenience.  Please follow-up with *** in ***  If you have any questions or concerns, please do not hesitate to call the office at [phone].  Best,   Dana Allan, MD

## 2021-01-09 NOTE — Progress Notes (Deleted)
    SUBJECTIVE:   CHIEF COMPLAINT / HPI:   ***  PERTINENT  PMH / PSH: ***  OBJECTIVE:   LMP 01/02/2021 (Exact Date)    General: Alert, no acute distress Cardio: Normal S1 and S2, RRR, no r/m/g Pulm: CTAB, normal work of breathing Abdomen: Bowel sounds normal. Abdomen soft and non-tender.  Extremities: No peripheral edema.  Neuro: Cranial nerves grossly intact   ASSESSMENT/PLAN:   No problem-specific Assessment & Plan notes found for this encounter.     Dana Allan, MD [facility]

## 2021-01-09 NOTE — ED Notes (Signed)
Sitting up in stretcher, VSS, no distress noted, denies needs. Awaiting MRI.

## 2021-01-09 NOTE — ED Notes (Signed)
Pt c/o continued hip pain; RN asked Dr Jeraldine Loots for orders for pain medicine

## 2021-01-09 NOTE — ED Notes (Signed)
Pt to [facility] and back via W/C
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
## 2021-01-09 NOTE — Discharge Instructions (Addendum)
You have been diagnosed with bulging of the intervertebral disc in the lower spine.  This causes inflammation in the surrounding tissue including the nerves that go down your leg, contributing to your pain and change in sensation.  Please obtain and take all medication as directed and schedule follow-up with our orthopedic colleagues as well as with your primary care physician.  Return here for concerning changes in your condition.  Below is the interpretation of today's MRI scan: FINDINGS: Body habitus reduces diagnostic sensitivity and specificity. Despite efforts by the technologist and patient, motion artifact is present on today's exam and could not be eliminated. This reduces exam sensitivity and specificity.   Segmentation: The lowest lumbar type non-rib-bearing vertebra is labeled as L5.   Alignment:  No vertebral subluxation is observed.   Vertebrae: Disc desiccation at L5-S1 No significant vertebral marrow edema is identified.   Conus medullaris and cauda equina: Conus extends to the L1-2 level. Conus and cauda equina appear normal.   Paraspinal and other soft tissues: Unremarkable   Disc levels:   T12-L1: Unremarkable.   L1-2: Unremarkable.   L2-3: Unremarkable.   L3-4: Unremarkable.   L4-5: No impingement.  Mild disc bulge.   L5-S1: Moderate left and mild right subarticular lateral recess stenosis due to central disc protrusion and mild disc bulge.   IMPRESSION: 1. Moderate left and mild right subarticular lateral recess stenosis at L5-S1 due to central disc protrusion and mild disc bulge. 2. Mild disc bulge at L4-5, without impingement at the L4-5 level.

## 2021-01-09 NOTE — ED Triage Notes (Signed)
Pt. Stated, I started having back pain for 2 weeks and I went to [facility] on 12/3 and they said I have Spinal Stenosis, and Im suppose to go to my Dr. Today, at 1000 today but I came over here cause I figure Im will have to come here anyway. Ive had peeing in the bed and not knowing it. Its not knowing it, its I can't make it cause I cant make it to the bathroom. My legs are numb and can't walk.

## 2021-01-15 ENCOUNTER — Other Ambulatory Visit: Payer: Self-pay

## 2021-01-15 ENCOUNTER — Ambulatory Visit (INDEPENDENT_AMBULATORY_CARE_PROVIDER_SITE_OTHER): Payer: 59 | Admitting: Family Medicine

## 2021-01-15 ENCOUNTER — Encounter: Payer: Self-pay | Admitting: Family Medicine

## 2021-01-15 VITALS — BP 157/110 | HR 105 | Ht 67.0 in | Wt 369.0 lb

## 2021-01-15 DIAGNOSIS — M792 Neuralgia and neuritis, unspecified: Secondary | ICD-10-CM

## 2021-01-15 DIAGNOSIS — M5442 Lumbago with sciatica, left side: Secondary | ICD-10-CM

## 2021-01-15 DIAGNOSIS — R4589 Other symptoms and signs involving emotional state: Secondary | ICD-10-CM | POA: Diagnosis not present

## 2021-01-15 DIAGNOSIS — G8929 Other chronic pain: Secondary | ICD-10-CM

## 2021-01-15 DIAGNOSIS — E1159 Type 2 diabetes mellitus with other circulatory complications: Secondary | ICD-10-CM | POA: Diagnosis not present

## 2021-01-15 DIAGNOSIS — I152 Hypertension secondary to endocrine disorders: Secondary | ICD-10-CM

## 2021-01-15 MED ORDER — HYDROCHLOROTHIAZIDE 12.5 MG PO CAPS: 12.5000 mg | ORAL_CAPSULE | Freq: Every day | ORAL | 0 refills | Status: DC

## 2021-01-15 MED ORDER — DULOXETINE HCL 60 MG PO CPEP: 60.0000 mg | ORAL_CAPSULE | Freq: Every day | ORAL | 0 refills | Status: DC

## 2021-01-15 MED ORDER — GABAPENTIN 300 MG PO CAPS: 900.0000 mg | ORAL_CAPSULE | Freq: Two times a day (BID) | ORAL | 0 refills | Status: AC

## 2021-01-15 NOTE — Assessment & Plan Note (Signed)
Lumbar radiculopathy as evidenced by recent MRI.  Advised that we are unable to offer corticosteroid injection of the spine.   - Increased gabapentin and duloxetine dose to help with neuropathic pain - PT referral - She has upcoming appointment with orthopedics

## 2021-01-15 NOTE — Patient Instructions (Addendum)
It was nice seeing you today!  Increase duloxetine and gabapentin as written.  Start taking HCTZ 12.5 mg daily. Continue to check your blood pressures at home.  Return in 2 weeks for BP check and blood work.  Please arrive at least 15 minutes prior to your scheduled appointments.  Stay well, Lisa Deeds, MD [facility] [phone]    If you are feeling suicidal or depression symptoms worsen please immediately go to:   If you are thinking about harming yourself or having thoughts of suicide, or if you know someone who is, seek help right away. If you are in crisis, make sure you are not left alone.  If someone else is in crisis, make sure he/she/they is not left alone  Call 988 OR 1-800-273-TALK  24 Hour Availability for Walk-IN services  [facility]  [address] [place], [place] DPOEU [place] [phone] Crisis [phone]    Other crisis resources:  [facility] of the [employer] (Domestic Violence, Rape & Victim Assistance [phone]  [facility] [employer] [facility]    (ONLY from 8am-4pm)    [phone]  [facility] Mobile Crisis Unit (24/7)   [phone]  [place] National Suicide Hotline   [phone] Lisa Cooley)   Therapy and Counseling Resources Most providers on this list will take Medicaid. Patients with commercial insurance or Medicare should contact their insurance company to get a list of in network providers.  [facility]:Psychiatry and Counseling [address] [facility] [place] [place], [place] [address] [phone]  [facility]  [address], Suite [place], [place] [address]      [phone]  [facility] [address]  [place], [place] [address] [phone]  [facility] [address]  [place], [place] [address]       [phone]     [facility] (virtual only) [phone]  Jovita Kussmaul [facility] [address], [place], [place] [phone]  [facility]:  [address] [place] [phone]  [facility]:  [address] [place] [place]ien[place] [phone]  [facility] (under & uninsured) [address]   [place] [phone]    [email or web address]    [facility] [address]  [place]    [phone]  [facility] -[address]     Phone:  [phone]     [facility]-  [address]  [phone]   [facility] [address]      [place], [place] [phone]  [facility]: [address] [place], [place], [place]  [phone]   [facility] (Spanish therapist) [email or web address]/  [address] [place]  [address]   [place] [address]    [phone]    [facility]   [address],  [place], [place]  [phone]   [facility]  [address] [place]  [phone]  [facility]  [address] [place], [place]        [phone]  Open Access/Walk In Clinic under & uninsured  [facility]  [address] [place], [place] [phone] Crisis [phone]  [facility] of the [place],  (Spanish)   [address], [place]: [phone]) 8:30 - 12; 1 - 2:30  [facility] of the [employer],  [address], [employer]  [place]    [phone]):8:30 - 12; 2 - 3PM  [facility] [employer],  [address]  [place]; [phone]):   Mon - Fri 8 AM - 5 PM  [facility] [address] [place]  MWF 12:30 to 3:00 or call to schedule an appointment  [phone]  Specific Provider options Psychology Today  [email or web address].[email or web address]/us click on find a therapist  enter your zip code left side and select or tailor a therapist for your specific  need.   [facility] Provider Directory [email or web address]  (Medicaid)   Follow all drop down to find a provider  Social Support program [facility] [place] [phone] or Photo[employer].pl [address], [place], [place] Recovery support and educational   24- Hour Availability:   [facility]  [address] [place], [place] [phone] Crisis [phone]  [facility] of the [facility] [phone]  [place] [facility]  [phone]   [facility]  [phone] (after hours)  [facility]rapeutic Alternative/Mobile Crisis   [phone]  [place] National Suicide Hotline  [phone] Lisa Cooley)  Call 911 or go to emergency room  [facility]  [phone]);  [place] and [employer]  [phone]); [place], [place], [place], [place], [place], [place], [place]

## 2021-01-15 NOTE — Assessment & Plan Note (Signed)
Depressed mood in light of back/leg pain and resulting disability.  Endorsing SI but denies intent or plan.  Therapy resources and crisis resources given.  Duloxetine dose was increased to 60 mg.

## 2021-01-15 NOTE — Assessment & Plan Note (Signed)
Not controlled.  Continue lisinopril 20 mg and add HCTZ 12.5 mg.  Follow-up in 2 weeks for RN visit BP check with labs (BMP).  Future order placed.
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
## 2021-01-17 ENCOUNTER — Ambulatory Visit: Payer: 59 | Admitting: Family Medicine

## 2021-01-23 ENCOUNTER — Ambulatory Visit: Payer: 59 | Admitting: Surgery

## 2021-01-29 ENCOUNTER — Other Ambulatory Visit: Payer: Self-pay

## 2021-01-29 ENCOUNTER — Encounter: Payer: Self-pay | Admitting: Orthopaedic Surgery

## 2021-01-29 ENCOUNTER — Ambulatory Visit (INDEPENDENT_AMBULATORY_CARE_PROVIDER_SITE_OTHER): Payer: 59 | Admitting: Orthopaedic Surgery

## 2021-01-29 VITALS — BP 177/114 | HR 98 | Ht 67.0 in | Wt 360.0 lb

## 2021-01-29 DIAGNOSIS — M5126 Other intervertebral disc displacement, lumbar region: Secondary | ICD-10-CM | POA: Diagnosis not present

## 2021-01-29 DIAGNOSIS — Z6841 Body Mass Index (BMI) 40.0 and over, adult: Secondary | ICD-10-CM

## 2021-01-29 NOTE — Progress Notes (Signed)
Office Visit Note   Patient: Lisa Cooley           Date of Birth: December 17, 1985           MRN: [id number] Visit Date: 01/29/2021              Requested by: Dana Allan, MD [address] [place],  [place] [address] PCP: Dana Allan, MD   Assessment & Plan: Visit Diagnoses:  1. Protrusion of lumbar intervertebral disc   2. BMI 50.0-59.9, adult (HCC)   3. Body mass index 50.0-59.9, adult (HCC)     Plan: We will set patient up for an epidural.  We discussed high disc recurrent rates with her high BMI with increased load on her lumbar disks.  We will make referral to Dr. Francena Hanly office for weight loss.  Epidural injection x1 L5-S1.  Work slip given no work x2 weeks.  I will recheck in 4 weeks. We discussed the pathophysiology of the condition recommended against her taking narcotic medication for this.  We discussed health benefits with diabetes, hypertension, cholesterol in addition to improvement in back symptoms with weight loss. Follow-Up Instructions: Return in about 4 weeks (around 02/26/2021).   Orders:  Orders Placed This Encounter  Procedures   Ambulatory referral to Physical Medicine Rehab   Amb Ref to Medical Weight Management   No orders of the defined types were placed in this encounter.     Procedures: No procedures performed   Clinical Data: No additional findings.   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  She has had back pain left leg pain numbness and fourth and fifth toe present for several months.  She states she has a sister got epidurals with relief.  She states at times the pain is so bad she could not walk.  She has been treated with Neurontin.  She also takes duloxetine.  She has had hydrocodone muscle relaxants states they really did not help.  MRI scan has been obtained results are  below.  No associated bowel or bladder symptoms.  Patient's been out of work.  Additional problems include type 2 diabetes with morbid obesity BMI 56.  Patient has hypertension associated with diabetes and also depression.  Review of Systems all other systems noncontributory to HPI.   Objective: Vital Signs: BP (!) 177/114    Pulse 98    Ht 5\' 7"  (1.702 m)    Wt (!) 360 lb (163.3 kg)    LMP 01/02/2021 (Exact Date)    BMI 56.38 kg/m   Physical Exam Constitutional:      Appearance: She is well-developed.  HENT:     Head: Normocephalic.     Right Ear: External ear normal.     Left Ear: External ear normal. There is no impacted cerumen.  Eyes:     Pupils: Pupils are equal, round, and reactive to light.  Neck:     Thyroid: No thyromegaly.     Trachea: No tracheal deviation.  Cardiovascular:     Rate and Rhythm: Normal rate.  Pulmonary:     Effort: Pulmonary effort is normal.  Abdominal:     Palpations: Abdomen is soft.  Musculoskeletal:     Cervical back: No rigidity.  Skin:    General: Skin is warm and dry.  Neurological:  Mental Status: She is alert and oriented to person, place, and time.  Psychiatric:        Behavior: Behavior normal.    Ortho Exam patient has negative straight leg raising negative logroll of the hips knee and ankle jerks are symmetrical and intact.  Anterior tib gastrocsoleus is intact she has some pain with heel and toe walking but is able to do so.  Blood pressure elevated 177/114.  Specialty Comments:  No specialty comments available.  Imaging: Narrative & Impression  CLINICAL DATA:  Low back pain with left leg paresthesias and incontinence   EXAM: MRI LUMBAR SPINE WITHOUT CONTRAST   TECHNIQUE: Multiplanar, multisequence MR imaging of the lumbar spine was performed. No intravenous contrast was administered.   COMPARISON:  01/04/2021 radiographs   FINDINGS: Body habitus reduces diagnostic sensitivity and specificity. Despite efforts by  the technologist and patient, motion artifact is present on today's exam and could not be eliminated. This reduces exam sensitivity and specificity.   Segmentation: The lowest lumbar type non-rib-bearing vertebra is labeled as L5.   Alignment:  No vertebral subluxation is observed.   Vertebrae: Disc desiccation at L5-S1 No significant vertebral marrow edema is identified.   Conus medullaris and cauda equina: Conus extends to the L1-2 level. Conus and cauda equina appear normal.   Paraspinal and other soft tissues: Unremarkable   Disc levels:   T12-L1: Unremarkable.   L1-2: Unremarkable.   L2-3: Unremarkable.   L3-4: Unremarkable.   L4-5: No impingement.  Mild disc bulge.   L5-S1: Moderate left and mild right subarticular lateral recess stenosis due to central disc protrusion and mild disc bulge.   IMPRESSION: 1. Moderate left and mild right subarticular lateral recess stenosis at L5-S1 due to central disc protrusion and mild disc bulge. 2. Mild disc bulge at L4-5, without impingement at the L4-5 level.     Electronically Signed   By: Gaylyn Rong M.D.   On: 01/09/2021 18:50     PMFS History: Patient Active Problem List   Diagnosis Date Noted   Protrusion of lumbar intervertebral disc 01/30/2021   Depressed mood 01/15/2021   Low back pain 10/25/2020   UTI (urinary tract infection) 10/09/2020   Right leg pain 07/25/2020   Myalgia 03/28/2020   Post-viral cough syndrome 11/06/2019   Hypertension associated with diabetes (HCC) 08/18/2018   Healthcare maintenance 08/18/2018   Screening for malignant neoplasm of cervix 12/20/2017   Positive depression screening 12/20/2017   Unprotected sex 09/17/2017   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   Screen for STD (sexually transmitted disease) 09/17/2017   Diabetes mellitus type 2 in obese (HCC) 12/31/2016   Epigastric pain 08/13/2011   DM2 (diabetes mellitus, type 2) (HCC) 08/12/2011   Morbid obesity  (HCC) 08/12/2011   Infertility, female 08/12/2011   Irregular periods 08/12/2011   Past Medical History:  Diagnosis Date   Diabetes mellitus    Dx in [place] but not on meds   Headache(784.0)    Hypertension     Family History  Problem Relation Age of Onset   Cancer Father    Heart disease Sister    Other Neg Hx     Past Surgical History:  Procedure Laterality Date   NO PAST SURGERIES     Social History   Occupational History   Not on file  Tobacco Use   Smoking status: Former    Packs/day: 0.25    Types: Cigarettes    Quit date: 04/20/2014    Years since  quitting: 6.7   Smokeless tobacco: Never  Substance and Sexual Activity   Alcohol use: No    Alcohol/week: 0.0 standard drinks   Drug use: Yes    Types: Marijuana    Comment: last use 2 days ago   Sexual activity: Yes    Birth control/protection: None

## 2021-01-30 ENCOUNTER — Ambulatory Visit: Payer: 59 | Admitting: Physician Assistant

## 2021-01-30 DIAGNOSIS — M5126 Other intervertebral disc displacement, lumbar region: Secondary | ICD-10-CM | POA: Insufficient documentation

## 2021-02-04 ENCOUNTER — Ambulatory Visit: Payer: 59 | Admitting: Physical Therapy

## 2021-02-04 ENCOUNTER — Telehealth: Payer: Self-pay | Admitting: Physical Medicine and Rehabilitation

## 2021-02-04 NOTE — Telephone Encounter (Signed)
Patient called needing to schedule an appointment with Dr Alvester Morin for her back. Patient said she was referred to Dr Alvester Morin. The number to contact patient is [phone]

## 2021-02-12 NOTE — Therapy (Incomplete)
OUTPATIENT PHYSICAL THERAPY THORACOLUMBAR EVALUATION   Patient Name: Lisa Cooley MRN: [id number] DOB:September 21, 1985, 36 y.o., female Today's Date: 02/12/2021    Past Medical History:  Diagnosis Date   Diabetes mellitus    Dx in [place] but not on meds   Headache(784.0)    Hypertension    Past Surgical History:  Procedure Laterality Date   NO PAST SURGERIES     Patient Active Problem List   Diagnosis Date Noted   Protrusion of lumbar intervertebral disc 01/30/2021   Depressed mood 01/15/2021   Low back pain 10/25/2020   UTI (urinary tract infection) 10/09/2020   Right leg pain 07/25/2020   Myalgia 03/28/2020   Post-viral cough syndrome 11/06/2019   Hypertension associated with diabetes (HCC) 08/18/2018   Healthcare maintenance 08/18/2018   Screening for malignant neoplasm of cervix 12/20/2017   Positive depression screening 12/20/2017   Unprotected sex 09/17/2017   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   Screen for STD (sexually transmitted disease) 09/17/2017   Diabetes mellitus type 2 in obese (HCC) 12/31/2016   Epigastric pain 08/13/2011   DM2 (diabetes mellitus, type 2) (HCC) 08/12/2011   Morbid obesity (HCC) 08/12/2011   Infertility, female 08/12/2011   Irregular periods 08/12/2011    PCP: Dana Allan, MD  REFERRING PROVIDER: Doreene Eland, MD  REFERRING DIAG: Chronic left-sided low back pain with left-sided sciatica  THERAPY DIAG:  No diagnosis found.  ONSET DATE: ***   SUBJECTIVE:                                                                                                                                                                                          SUBJECTIVE STATEMENT: ***  PERTINENT HISTORY:  ***  PAIN:  Are you having pain? Yes NPRS scale: ***/10 Pain location: *** Pain orientation: {Pain Orientation:25161}  PAIN TYPE: Chronic Pain description: {PAIN DESCRIPTION:21022940}  Aggravating factors:  *** Relieving factors: ***  PRECAUTIONS: None  WEIGHT BEARING RESTRICTIONS No  FALLS:  Has patient fallen in last 6 months? {yes/no:20286}, Number of falls: ***  LIVING ENVIRONMENT: Lives with: {[facility] lives with:25569::"lives with their family"} Lives in: {Lives in:25570} Stairs: {yes/no:20286}; {Stairs:24000} Has following equipment at home: {Assistive devices:23999}  OCCUPATION: ***  PLOF: Independent  PATIENT GOALS: Pain relief and ***   OBJECTIVE:  DIAGNOSTIC FINDINGS:  ***  PATIENT SURVEYS:  {rehab surveys:24030}  SCREENING FOR RED FLAGS: Negative  COGNITION:  Overall cognitive status: Within functional limits for tasks assessed     SENSATION:  Light touch: Appears intact  MUSCLE LENGTH: ***  POSTURE:  ***  PALPATION: ***  LUMBAR AROM  A/PROM A/PROM  02/12/2021  Flexion   Extension   Right lateral flexion   Left lateral flexion   Right rotation   Left rotation    LE AROM/PROM:   ***  LE MMT:  MMT Right 02/12/2021 Left 02/12/2021  Hip flexion    Hip extension    Hip abduction    Knee flexion    Knee extension     LUMBAR SPECIAL TESTS:  {lumbar special test:25242}  FUNCTIONAL TESTS:  {Functional tests:24029}  GAIT: Distance walked: *** Assistive device utilized: {Assistive devices:23999} Level of assistance: {Levels of assistance:24026} Comments: ***   TODAY'S TREATMENT  ***  PATIENT EDUCATION:  Education details: Exam findings, POC, HEP Person educated: Patient Education method: Explanation, Demonstration, Tactile cues, Verbal cues, and Handouts Education comprehension: verbalized understanding, returned demonstration, verbal cues required, tactile cues required, and needs further education  HOME EXERCISE PROGRAM: ***   ASSESSMENT: CLINICAL IMPRESSION: Patient is a 36 y.o. female who was seen today for physical therapy evaluation and treatment for ***. Objective impairments include {opptimpairments:25111}. These  impairments are limiting patient from {activity limitations:25113}. Personal factors including {Personal factors:25162} are also affecting patient's functional outcome. Patient will benefit from skilled PT to address above impairments and improve overall function.  REHAB POTENTIAL: {rehabpotential:25112}  CLINICAL DECISION MAKING: Stable/uncomplicated  EVALUATION COMPLEXITY: Low   GOALS: Goals reviewed with patient? Yes  SHORT TERM GOALS:  STG Name Target Date Goal status  1 Patient will be I with initial HEP in order to progress with therapy. Baseline: HEP provided at eval {follow up:25551} {GOALSTATUS:25110}  2 *** Baseline:  {follow up:25551} {GOALSTATUS:25110}  3 *** Baseline: {follow up:25551} {GOALSTATUS:25110}  4 *** Baseline: {follow up:25551} {GOALSTATUS:25110}   LONG TERM GOALS:   LTG Name Target Date Goal status  1 Patient will be I with final HEP to maintain progress from PT. Baseline: HEP provided at eval {follow up:25551} {GOALSTATUS:25110}  2 *** Baseline: {follow up:25551} {GOALSTATUS:25110}  3 *** Baseline: {follow up:25551} {GOALSTATUS:25110}  4 *** Baseline: {follow up:25551} {GOALSTATUS:25110}    PLAN: PT FREQUENCY: {rehab frequency:25116}  PT DURATION: {rehab duration:25117}  PLANNED INTERVENTIONS: {rehab planned interventions:25118::"Therapeutic exercises","Therapeutic activity","Neuro Muscular re-education","Balance training","Gait training","Patient/Family education","Joint mobilization"}  PLAN FOR NEXT SESSION: Review HEP and progress PRN, ***  Rosana Hoes, PT, DPT, LAT, ATC 02/12/21  5:11 PM Phone: [phone] Fax: [phone]  Hilbert Bible 02/12/2021, 5:08 PM

## 2021-02-13 ENCOUNTER — Ambulatory Visit: Payer: 59 | Attending: Family Medicine | Admitting: Physical Therapy

## 2021-02-17 ENCOUNTER — Telehealth: Payer: Self-pay | Admitting: Orthopaedic Surgery

## 2021-02-17 ENCOUNTER — Other Ambulatory Visit: Payer: Self-pay | Admitting: Orthopaedic Surgery

## 2021-02-17 NOTE — Telephone Encounter (Signed)
Pt called stating she was referred by Dr.Yates to get an injection but due to problems with her insurance she isn't able to get the inj right now. Pt would like to know if she can have something for pain called in to last her until she's able to schedule her injection.   [phone]

## 2021-02-17 NOTE — Telephone Encounter (Signed)
Please advise 

## 2021-02-18 NOTE — Telephone Encounter (Signed)
Pt called back and states that she is in a lot of pain and needs to speak with someone.   CB [phone]

## 2021-02-19 ENCOUNTER — Other Ambulatory Visit (HOSPITAL_COMMUNITY)
Admission: RE | Admit: 2021-02-19 | Discharge: 2021-02-19 | Disposition: A | Discharge status: Home / Self Care | Payer: Medicaid Other | Source: Ambulatory Visit | Attending: Family Medicine | Admitting: Family Medicine

## 2021-02-19 ENCOUNTER — Encounter: Payer: Self-pay | Admitting: Family Medicine

## 2021-02-19 ENCOUNTER — Ambulatory Visit: Payer: 59 | Admitting: Family Medicine

## 2021-02-19 ENCOUNTER — Other Ambulatory Visit: Payer: Self-pay

## 2021-02-19 ENCOUNTER — Ambulatory Visit (INDEPENDENT_AMBULATORY_CARE_PROVIDER_SITE_OTHER): Payer: Medicaid Other | Admitting: Family Medicine

## 2021-02-19 VITALS — BP 143/104 | HR 98 | Ht 67.0 in | Wt 371.2 lb

## 2021-02-19 DIAGNOSIS — M5126 Other intervertebral disc displacement, lumbar region: Secondary | ICD-10-CM

## 2021-02-19 DIAGNOSIS — N926 Irregular menstruation, unspecified: Secondary | ICD-10-CM | POA: Diagnosis not present

## 2021-02-19 DIAGNOSIS — Z113 Encounter for screening for infections with a predominantly sexual mode of transmission: Secondary | ICD-10-CM | POA: Diagnosis not present

## 2021-02-19 LAB — POCT WET PREP (WET MOUNT)
Clue Cells Wet Prep Whiff POC: POSITIVE
Trichomonas Wet Prep HPF POC: ABSENT

## 2021-02-19 LAB — POCT URINE PREGNANCY: Preg Test, Ur: NEGATIVE

## 2021-02-19 MED ORDER — PREDNISONE 20 MG PO TABS: ORAL_TABLET | ORAL | 0 refills | Status: DC

## 2021-02-19 MED ORDER — TRAMADOL HCL 50 MG PO TABS: ORAL_TABLET | ORAL | 3 refills | Status: DC

## 2021-02-19 NOTE — Patient Instructions (Signed)
See either me or Dr Clent Ridges back in about 3 weeks.

## 2021-02-20 LAB — CERVICOVAGINAL ANCILLARY ONLY
Chlamydia: NEGATIVE
Comment: NEGATIVE
Comment: NORMAL
Neisseria Gonorrhea: NEGATIVE

## 2021-02-20 NOTE — Assessment & Plan Note (Addendum)
Long discussion with patient regarding back pain.  I think it is a component of functional back pain from habitus, muscular imbalance as well as some component from the herniated disc.  I reviewed the MRI images and report with her.  Answered all her questions.  Unfortunately she is not able to work currently secondary to pain.  I agreed that starting narcotics is not the best option but we will start her on tramadol 100 mg every 8 as needed and have her follow-up with [place] PCP or me in 3 weeks.  We need to get her up and moving, back to work, and then potentially we can address her muscular imbalance etc.  I would also be happy to see her at sports medicine.  I did review Dr. Ophelia Charter notes.  It is unfortunate that her insurance will not pay for her epidural steroid and perhaps we can agree visit that it future date.  Additionally we discussed gabapentin and efficacy of that.  She is currently at a low dose and did not understand that she would need to taper up.  She will get back on that regularly and we will reevaluate when I see her back, potentially plan increased dose at next office visit.

## 2021-02-20 NOTE — Progress Notes (Signed)
° ° °  CHIEF COMPLAINT / HPI:   #1.  Patient desires STI testing.  Slight amount of vaginal discharge but no other true symptoms.  New partner. 2.  Chronic back pain that is significantly worse in the last 3 months.  Followed by orthopedics and they are recommending epidural steroid but her insurance is not going to allow that.  She is frustrated that she is currently out of work.  Back pain is chronic 4-8 over 10.  Worse with chronic standing or walking.  Occasionally radiates down bilateral legs no change in bowel or bladder habits.  No weakness in legs.  Had MRI done previously  PERTINENT  PMH / [place]: I have reviewed the patients medications, allergies, past medical and surgical history, smoking status and updated in the EMR as appropriate. MRI LS Spine IMPRESSION: 1. Moderate left and mild right subarticular lateral recess stenosis at L5-S1 due to central disc protrusion and mild disc bulge. 2. Mild disc bulge at L4-5, without impingement at the L4-5 level.  OBJECTIVE:  BP (!) 143/104    Pulse 98    Ht 5\' 7"  (1.702 m)    Wt (!) 371 lb 3.2 oz (168.4 kg)    LMP 01/02/2021 (Exact Date)    SpO2 100%    BMI 58.14 kg/m  GENERAL: Well-developed female no acute distress GU: Externally normal.  Cervix is normal in appearance.  There are some thin white discharge.  No adnexal masses or tenderness.  Exam limited by habitus. EXTREMITY: Lower extremity strength 5 out of 5 flexion extension hip knee ankle. NEURO: Intact soft touch sensation bilateral lower extremity. BACK: Mild tenderness palpation in the para-Vertebral muscles with some spasm noted L4-5.[name] PSYCH: AxOx4. Good eye contact.. No psychomotor retardation or agitation. Appropriate speech fluency and content. Asks and answers questions appropriately. Mood is congruent.    ASSESSMENT / PLAN: STI testing performed.  She is positive for bacterial vaginosis.  We discussed that since she is essentially asymptomatic other than some slight discharge  from that, she opts to watch and not treat with metronidazole.  Additionally, we are starting new medications today and that might cloud the picture with side effects.  We will revisit when I see her back in 3 weeks.  GC chlamydia pending.  No problem-specific Assessment & Plan notes found for this encounter.   Dorcas Mcmurray MD

## 2021-02-21 ENCOUNTER — Telehealth: Payer: Self-pay | Admitting: *Deleted

## 2021-02-21 NOTE — Telephone Encounter (Signed)
Received fax from pharmacy with a note to [place]. Lisa Cooley, CMA   Please send diagnosis info on chronic pain therapy.  Will need for future refills.

## 2021-02-24 ENCOUNTER — Encounter: Payer: Self-pay | Admitting: Family Medicine

## 2021-03-04 ENCOUNTER — Encounter: Payer: Self-pay | Admitting: Family Medicine

## 2021-03-19 ENCOUNTER — Ambulatory Visit: Payer: 59 | Admitting: Family Medicine

## 2021-04-09 ENCOUNTER — Ambulatory Visit (INDEPENDENT_AMBULATORY_CARE_PROVIDER_SITE_OTHER): Payer: Managed Care, Other (non HMO) | Admitting: Family Medicine

## 2021-04-09 ENCOUNTER — Other Ambulatory Visit: Payer: Self-pay

## 2021-04-09 ENCOUNTER — Encounter: Payer: Self-pay | Admitting: Family Medicine

## 2021-04-09 VITALS — BP 164/102 | HR 93 | Temp 98.4°F | Ht 67.0 in | Wt 365.0 lb

## 2021-04-09 DIAGNOSIS — E1159 Type 2 diabetes mellitus with other circulatory complications: Secondary | ICD-10-CM

## 2021-04-09 DIAGNOSIS — M25532 Pain in left wrist: Secondary | ICD-10-CM | POA: Diagnosis not present

## 2021-04-09 DIAGNOSIS — E1169 Type 2 diabetes mellitus with other specified complication: Secondary | ICD-10-CM

## 2021-04-09 DIAGNOSIS — E669 Obesity, unspecified: Secondary | ICD-10-CM | POA: Diagnosis not present

## 2021-04-09 DIAGNOSIS — I152 Hypertension secondary to endocrine disorders: Secondary | ICD-10-CM

## 2021-04-09 DIAGNOSIS — N946 Dysmenorrhea, unspecified: Secondary | ICD-10-CM | POA: Diagnosis not present

## 2021-04-09 DIAGNOSIS — M5410 Radiculopathy, site unspecified: Secondary | ICD-10-CM

## 2021-04-09 LAB — POCT GLYCOSYLATED HEMOGLOBIN (HGB A1C): HbA1c, POC (controlled diabetic range): 9.2 % — AB (ref 0.0–7.0)

## 2021-04-09 MED ORDER — IBUPROFEN 600 MG PO TABS: 600.0000 mg | ORAL_TABLET | Freq: Four times a day (QID) | ORAL | 1 refills | Status: DC | PRN

## 2021-04-09 MED ORDER — OLMESARTAN-AMLODIPINE-HCTZ 40-5-25 MG PO TABS: ORAL_TABLET | ORAL | 3 refills | Status: DC

## 2021-04-09 NOTE — Patient Instructions (Signed)
STOP the HCTZ and the lisinopril. START the new medicine I sent in for your blood pressure. ? ?I am setting you up for a pelvic ultrasound to evaluate your issues. ? ?Let me see you in about 3 or 4 weeks and we can follow up on the Ultrasound, the new BP medicine and your wrist. ?It is OK to use the tramadol for menstrual cramps. Great to see you! ?

## 2021-04-10 ENCOUNTER — Encounter: Payer: Self-pay | Admitting: Family Medicine

## 2021-04-10 DIAGNOSIS — N946 Dysmenorrhea, unspecified: Secondary | ICD-10-CM | POA: Insufficient documentation

## 2021-04-10 DIAGNOSIS — M25532 Pain in left wrist: Secondary | ICD-10-CM | POA: Insufficient documentation

## 2021-04-10 NOTE — Assessment & Plan Note (Addendum)
Improved with steroids.  We discussed the fact she cannot use them long term.  She occasionally uses 600 mg ibuprofen and I will refill that.  At some point, would like to get her into physical therapy or home exercise program for her back. ? ?Also continue gabapentin at current dose.  Could consider increasing that some in the future.  She is also taking Cymbalta and will continue that. ?

## 2021-04-10 NOTE — Progress Notes (Signed)
? ? ?CHIEF COMPLAINT / HPI: ?#1.  Follow-up on back pain.  Significantly better after the round of steroids.  She still has some pain but she is doing much better ?2.  Continues to have problems with significant heavy menses and over the last few months has had significant increase in her dysmenorrhea.  Is always had a lot of pain on the second day of her menstrual cycle with a lot of clots.  This pain has essentially doubled in the last 4 to 6 months.  She is concerned because at some point she has plans to conceive.  She has questions about the clots. ?3.  Left wrist pain.  This has been ongoing for about 4 weeks.  She is right-hand dominant.  Wrist bothers her when she tries to lift things such as pots or pans or books.  It is in the radial part of the wrist.  Feels weak and is tender to palpation there.  No prior history of injury. ?4.  Headache: She wonders if this is related to her blood pressure.  She is having some significant stressors at home.  Headache is occurring many days of the week.  Does not awaken with headache.  She does not have any change in visual symptoms, no dizziness but does experience pounding bandlike headache for several hours at a time.  She is taking her lisinopril but not taking her HCTZ. ? ? ?PERTINENT  PMH / PSH: I have reviewed the patient?s medications, allergies, past medical and surgical history, smoking status and updated in the EMR as appropriate. ? ? ?OBJECTIVE: ? BP (!) 164/102   Pulse 93   Temp 98.4 ?F (36.9 ?C) (Oral)   Ht 5\' 7"  (1.702 m)   Wt (!) 365 lb (165.6 kg)   LMP 04/09/2021   BMI 57.17 kg/m?  ? ?Vital signs reviewed. ?GENERAL: Well-developed, well-nourished, no acute distress. ?CARDIOVASCULAR: Regular rate and rhythm no murmur gallop or rub ?LUNGS: Clear to auscultation bilaterally, no rales or wheeze. ?ABDOMEN: Soft positive bowel sounds ?NEURO: No gross focal neurological deficits. ?MSK: Movement of extremity x 4.  Left wrist is mildly tender to palpate  over the area of the [facility] joint.  There is also a small palpable mass that may be a ganglion cyst at that area.  She has full range of motion flexion extension.  Normal strength and function of her hand.  The skin around the area is without any unusual erythema or lesions. ? ?ASSESSMENT / PLAN: ? ? ?Radicular syndrome of lower limbs ?Improved with steroids.  We discussed the fact she cannot use them long term.  She occasionally uses 600 mg ibuprofen and I will refill that.  At some point, would like to get her into physical therapy or home exercise program for her back. ? ?Also continue gabapentin at current dose.  Could consider increasing that some in the future.  She is also taking Cymbalta and will continue that. ? ?Hypertension associated with diabetes (HCC) ?Blood pressure is not controlled.  I think this is at least contributing to headache.  We will change medications today.  Follow-up 2 weeks. ? ?Severe dysmenorrhea ?We discussed options and we will set up pelvic ultrasound.  I will follow this up when I see her back in a couple of weeks. ? ?Left wrist pain ?To her nondominant hand.  I suspect it is the beginning of a small ganglion cyst.  We will plan to see her in the future at sports medicine center and  do the ultrasound.  Will address more pressing issues today and follow-up with this in the next few months. ?  ?Denny Levy MD ?

## 2021-04-10 NOTE — Assessment & Plan Note (Signed)
To her nondominant hand.  I suspect it is the beginning of a small ganglion cyst.  We will plan to see her in the future at sports medicine center and do the ultrasound.  Will address more pressing issues today and follow-up with this in the next few months. ?

## 2021-04-10 NOTE — Assessment & Plan Note (Signed)
We discussed options and we will set up pelvic ultrasound.  I will follow this up when I see her back in a couple of weeks. ?

## 2021-04-10 NOTE — Assessment & Plan Note (Signed)
Blood pressure is not controlled.  I think this is at least contributing to headache.  We will change medications today.  Follow-up 2 weeks. ?

## 2021-04-16 ENCOUNTER — Other Ambulatory Visit: Payer: 59

## 2021-04-16 ENCOUNTER — Other Ambulatory Visit: Payer: Self-pay | Admitting: Family Medicine

## 2021-04-16 MED ORDER — LOSARTAN POTASSIUM-HCTZ 50-12.5 MG PO TABS: 1.0000 | ORAL_TABLET | Freq: Every day | ORAL | 3 refills | Status: DC

## 2021-04-16 MED ORDER — AMLODIPINE BESYLATE 5 MG PO TABS: 5.0000 mg | ORAL_TABLET | Freq: Every day | ORAL | 3 refills | Status: DC

## 2021-04-24 ENCOUNTER — Encounter: Payer: Self-pay | Admitting: Family Medicine

## 2021-04-25 NOTE — Telephone Encounter (Signed)
Sent.  Thank you.

## 2021-04-29 ENCOUNTER — Other Ambulatory Visit: Payer: Self-pay | Admitting: Family Medicine

## 2021-04-29 ENCOUNTER — Other Ambulatory Visit (HOSPITAL_COMMUNITY): Payer: Self-pay

## 2021-04-29 MED ORDER — PREDNISONE 20 MG PO TABS: 40.0000 mg | ORAL_TABLET | Freq: Every day | ORAL | 0 refills | Status: DC

## 2021-04-29 MED ORDER — CYCLOBENZAPRINE HCL 10 MG PO TABS
10.0000 mg | ORAL_TABLET | Freq: Every evening | ORAL | 2 refills | Status: DC | PRN
Start: 2021-04-29 — End: 2021-06-21

## 2021-05-05 ENCOUNTER — Encounter: Payer: Self-pay | Admitting: Family Medicine

## 2021-05-05 ENCOUNTER — Ambulatory Visit (INDEPENDENT_AMBULATORY_CARE_PROVIDER_SITE_OTHER): Payer: Managed Care, Other (non HMO) | Admitting: Family Medicine

## 2021-05-05 VITALS — BP 136/84 | HR 97 | Ht 67.0 in | Wt 370.6 lb

## 2021-05-05 DIAGNOSIS — E1169 Type 2 diabetes mellitus with other specified complication: Secondary | ICD-10-CM | POA: Diagnosis not present

## 2021-05-05 DIAGNOSIS — M5410 Radiculopathy, site unspecified: Secondary | ICD-10-CM | POA: Diagnosis not present

## 2021-05-05 DIAGNOSIS — E1159 Type 2 diabetes mellitus with other circulatory complications: Secondary | ICD-10-CM | POA: Diagnosis not present

## 2021-05-05 DIAGNOSIS — I152 Hypertension secondary to endocrine disorders: Secondary | ICD-10-CM

## 2021-05-05 DIAGNOSIS — E785 Hyperlipidemia, unspecified: Secondary | ICD-10-CM

## 2021-05-05 DIAGNOSIS — E119 Type 2 diabetes mellitus without complications: Secondary | ICD-10-CM | POA: Diagnosis not present

## 2021-05-05 MED ORDER — DICLOFENAC SODIUM 1 % EX GEL: 4.0000 g | Freq: Four times a day (QID) | CUTANEOUS | 2 refills | Status: DC

## 2021-05-05 NOTE — Patient Instructions (Signed)
Thank you for coming to see me today. It was a pleasure.  ? ?Take Hyzaar 50/12.5mg  once a day in the morning ?Take Amlodipine 5 mg at night ? ?We will get some labs today.  If they are abnormal or we need to do something about them, I will call you.  If they are normal, I will send you a message on MyChart (if it is active) or a letter in the mail.  If you don't hear from [place] in 2 weeks, please call the office at the number below.  ? ?Think about starting a weekly injection called Mounjaro.  This will help lower your diabetes and help with weight loss.  Call your insurance to see if this is covered. ? ?Please follow-up with PCP in 1 week ? ?If you have any questions or concerns, please do not hesitate to call the office at [phone]. ? ?Best,  ? ?Carollee Leitz, MD ?

## 2021-05-05 NOTE — Progress Notes (Signed)
? ? ?  SUBJECTIVE:  ? ?CHIEF COMPLAINT / HPI: blood pressure follow up  ? ?Amlodipine recently added but has not started medication yet.  Continues to take Hyzaar 50/12.5mg  daily.  Denies any visual changes, chest pain, shortness of breath or lower extremity edema. ? ?Back pain ?Has chronic lumbar pain. Has not picked up Tramadol from pharmacy.  Currently on Prednisone 40 mg daily and has 2 doses remaining.  Out of diclofenac gel. ? ?PERTINENT  PMH / PSH:  ?Lower back pain with sciatica ?HTN ? ?OBJECTIVE:  ? ?BP 136/84   Pulse 97   Ht 5\' 7"  (1.702 m)   Wt (!) 370 lb 9.6 oz (168.1 kg)   LMP 04/09/2021   SpO2 100%   BMI 58.04 kg/m?   ? ?General: Alert, no acute distress ?Cardio: Normal S1 and S2, RRR, no r/m/g ?Pulm: CTAB, normal work of breathing ?Abdomen: Bowel sounds normal. Abdomen soft and non-tender.  ? ? ? ?ASSESSMENT/PLAN:  ? ?Hypertension associated with diabetes (HCC) ?Initially elevated.  Repeat lower but not at goal 130/80 ?Recommend to start Amlodipine 5 mg daily ?Continue Hyzaar 50/12.5 mg daily ?Cmet today ?Follow up in 1-2 weeks ? ?DM2 (diabetes mellitus, type 2) (HCC) ?Recent A1c 9.2 ?Repeat due in June ?Continue Glipizide 5 mg daily ?Metformin was discontinued, patient reported not taking ?Consider Mounjaro at next visit, if no contraindications ? ? ?Radicular syndrome of lower limbs ?Continue current medications ? ?Morbid obesity (HCC) ?Lipid panel today ?Consider discussion of Bariatric surgery for weight loss at next visit ?  ? ? ?July, MD ?[facility]  ?

## 2021-05-06 LAB — COMPREHENSIVE METABOLIC PANEL
ALT: 11 IU/L (ref 0–32)
AST: 6 IU/L (ref 0–40)
Albumin/Globulin Ratio: 1.8 (ref 1.2–2.2)
Albumin: 4 g/dL (ref 3.8–4.8)
Alkaline Phosphatase: 58 IU/L (ref 44–121)
BUN/Creatinine Ratio: 15 (ref 9–23)
BUN: 10 mg/dL (ref 6–20)
Bilirubin Total: 0.2 mg/dL (ref 0.0–1.2)
CO2: 23 mmol/L (ref 20–29)
Calcium: 9.6 mg/dL (ref 8.7–10.2)
Chloride: 101 mmol/L (ref 96–106)
Creatinine, Ser: 0.65 mg/dL (ref 0.57–1.00)
Globulin, Total: 2.2 g/dL (ref 1.5–4.5)
Glucose: 165 mg/dL — ABNORMAL HIGH (ref 70–99)
Potassium: 4.3 mmol/L (ref 3.5–5.2)
Sodium: 137 mmol/L (ref 134–144)
Total Protein: 6.2 g/dL (ref 6.0–8.5)
eGFR: 118 mL/min/{1.73_m2} (ref >59)

## 2021-05-06 LAB — LIPID PANEL
Chol/HDL Ratio: 4 ratio (ref 0.0–4.4)
Cholesterol, Total: 252 mg/dL — ABNORMAL HIGH (ref 100–199)
HDL: 63 mg/dL (ref >39)
LDL Chol Calc (NIH): 163 mg/dL — ABNORMAL HIGH (ref 0–99)
Triglycerides: 146 mg/dL (ref 0–149)
VLDL Cholesterol Cal: 26 mg/dL (ref 5–40)

## 2021-05-07 ENCOUNTER — Encounter: Payer: Self-pay | Admitting: Family Medicine

## 2021-05-07 NOTE — Telephone Encounter (Signed)
A letter was sent ? ?Thanks

## 2021-05-07 NOTE — Assessment & Plan Note (Addendum)
Recent A1c 9.2 ?Repeat due in June ?Continue Glipizide 5 mg daily ?Metformin was discontinued, patient reported not taking ?Consider Mounjaro at next visit, if no contraindications ? ?

## 2021-05-07 NOTE — Assessment & Plan Note (Signed)
Lipid panel today ?Consider discussion of Bariatric surgery for weight loss at next visit ?

## 2021-05-07 NOTE — Progress Notes (Signed)
Letter sent with lab results ? ?Jovan Schickling, MD ?[facility]   ?

## 2021-05-07 NOTE — Assessment & Plan Note (Signed)
Continue current medications. 

## 2021-05-07 NOTE — Assessment & Plan Note (Signed)
Initially elevated.  Repeat lower but not at goal 130/80 ?Recommend to start Amlodipine 5 mg daily ?Continue Hyzaar 50/12.5 mg daily ?Cmet today ?Follow up in 1-2 weeks ?

## 2021-05-12 NOTE — Patient Instructions (Incomplete)
Thank you for coming to see me today. It was a pleasure. Today we talked about:  ° ° ° °Please follow-up with *** in *** ° °If you have any questions or concerns, please do not hesitate to call the office at [phone]. ° °Best,  ° °Bach Rocchi, MD   °

## 2021-05-12 NOTE — Progress Notes (Deleted)
    SUBJECTIVE:   CHIEF COMPLAINT / HPI:   ***  PERTINENT  PMH / PSH: ***  OBJECTIVE:   There were no vitals taken for this visit.   General: Alert, no acute distress Cardio: Normal S1 and S2, RRR, no r/m/g Pulm: CTAB, normal work of breathing Abdomen: Bowel sounds normal. Abdomen soft and non-tender.  Extremities: No peripheral edema.  Neuro: Cranial nerves grossly intact   ASSESSMENT/PLAN:   No problem-specific Assessment & Plan notes found for this encounter.     Daltyn Degroat, MD [facility]   

## 2021-05-13 ENCOUNTER — Ambulatory Visit: Payer: 59 | Admitting: Family Medicine

## 2021-05-17 ENCOUNTER — Encounter: Payer: Self-pay | Admitting: Family Medicine

## 2021-05-20 NOTE — Telephone Encounter (Signed)
She needs to be seen for evaluation. Thanks.

## 2021-06-14 ENCOUNTER — Ambulatory Visit (HOSPITAL_COMMUNITY)
Admission: EM | Admit: 2021-06-14 | Discharge: 2021-06-14 | Disposition: A | Discharge status: Home / Self Care | Payer: Commercial Managed Care - HMO

## 2021-06-14 NOTE — ED Notes (Signed)
Called from waiting room, no answer from lobby.  ?

## 2021-06-21 ENCOUNTER — Ambulatory Visit (HOSPITAL_COMMUNITY)
Admission: EM | Admit: 2021-06-21 | Discharge: 2021-06-21 | Disposition: A | Discharge status: Home / Self Care | Payer: Commercial Managed Care - HMO | Attending: Physician Assistant | Admitting: Physician Assistant

## 2021-06-21 ENCOUNTER — Encounter: Payer: Self-pay | Admitting: Family Medicine

## 2021-06-21 ENCOUNTER — Encounter (HOSPITAL_COMMUNITY): Payer: Self-pay | Admitting: *Deleted

## 2021-06-21 DIAGNOSIS — Z113 Encounter for screening for infections with a predominantly sexual mode of transmission: Secondary | ICD-10-CM | POA: Insufficient documentation

## 2021-06-21 DIAGNOSIS — G8929 Other chronic pain: Secondary | ICD-10-CM | POA: Insufficient documentation

## 2021-06-21 DIAGNOSIS — M5442 Lumbago with sciatica, left side: Secondary | ICD-10-CM | POA: Insufficient documentation

## 2021-06-21 DIAGNOSIS — N898 Other specified noninflammatory disorders of vagina: Secondary | ICD-10-CM | POA: Insufficient documentation

## 2021-06-21 HISTORY — DX: Other intervertebral disc displacement, lumbar region: M51.26

## 2021-06-21 HISTORY — DX: Sciatica, unspecified side: M54.30

## 2021-06-21 LAB — POCT URINALYSIS DIPSTICK, ED / UC
Bilirubin Urine: NEGATIVE
Glucose, UA: NEGATIVE mg/dL
Hgb urine dipstick: NEGATIVE
Ketones, ur: NEGATIVE mg/dL
Leukocytes,Ua: NEGATIVE
Nitrite: NEGATIVE
Protein, ur: NEGATIVE mg/dL
Specific Gravity, Urine: 1.02 (ref 1.005–1.030)
Urobilinogen, UA: 0.2 mg/dL (ref 0.0–1.0)
pH: 7 (ref 5.0–8.0)

## 2021-06-21 LAB — POC URINE PREG, ED: Preg Test, Ur: NEGATIVE

## 2021-06-21 LAB — HEPATITIS C ANTIBODY: HCV Ab: NONREACTIVE

## 2021-06-21 LAB — HIV ANTIBODY (ROUTINE TESTING W REFLEX): HIV Screen 4th Generation wRfx: NONREACTIVE

## 2021-06-21 MED ORDER — METHOCARBAMOL 500 MG PO TABS: 500.0000 mg | ORAL_TABLET | Freq: Three times a day (TID) | ORAL | 0 refills | Status: DC | PRN

## 2021-06-21 MED ORDER — METHYLPREDNISOLONE ACETATE 80 MG/ML IJ SUSP
60.0000 mg | Freq: Once | INTRAMUSCULAR | Status: AC
  Administered 2021-06-21: 60 mg via INTRAMUSCULAR

## 2021-06-21 MED ORDER — METHYLPREDNISOLONE ACETATE 80 MG/ML IJ SUSP
INTRAMUSCULAR | Status: AC
  Filled 2021-06-21: qty 1

## 2021-06-21 NOTE — Discharge Instructions (Signed)
Your urine was normal.  I think your back pain is related to your slipped disc.  We gave you a shot of steroids today.  Please take methocarbamol up to 3 times a day.  This to make you sleepy so do not drive or drink alcohol with taking it.  Continue your previously prescribed medication.  Follow-up with your pain management/PCP.  If anything worsens please return for reevaluation.

## 2021-06-21 NOTE — ED Triage Notes (Signed)
C/O polyuria since yesterday without dysuria onset yesterday. C/O occasional twinges of low abd pain. C/O low back pain that's different than her normal "slipped disc" back pain. Denies vaginal discharge, but c/o "strange feeling" in vagina onset approx 2 wks ago. Denies fevers.

## 2021-06-21 NOTE — ED Provider Notes (Signed)
[name]    CSN: [id number] Arrival date & time: 06/21/21  1009      History   Chief Complaint Chief Complaint  Patient presents with   Polyuria   Back Pain   Vaginal irritation    HPI Lisa Cooley is a 36 y.o. female.   Patient presents today with a several day history of changing back pain and urinary frequency.  She does have a history of chronic back pain and is currently followed by pain management for which she uses tramadol and Robaxin.  She reports that her back pain has become more intense and traveled into her thoracic spine making her concern for an additional cause.  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  She does report that often she will use prednisone taper to help with acute symptoms and reports last being prescribed this approximately 1 month ago.  She reports urinary frequency but denies any dysuria, urgency, pelvic pain, vaginal discharge.  She does report some vaginal discomfort.  She is sexually active with female partners but does not consistently use condoms.  She denies any bowel/bladder continence, lower extremity weakness, saddle anesthesia.  Denies any fever, nausea, vomiting, chest pain, shortness of breath.   Past Medical History:  Diagnosis Date   Diabetes mellitus    Dx in [place] but not on meds   Headache(784.0)    Hypertension    Lumbar herniated disc    Sciatica     Patient Active Problem List   Diagnosis Date Noted   Severe dysmenorrhea 04/10/2021   Left wrist pain 04/10/2021   Protrusion of lumbar intervertebral disc 01/30/2021   Hypertension associated with diabetes ([place]) 08/18/2018   Healthcare maintenance 08/18/2018   Screening for malignant neoplasm of cervix 12/20/2017   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   DM2 (diabetes mellitus, type 2) ([place]) 08/12/2011   Morbid obesity ([place]) 08/12/2011   Infertility, female 08/12/2011   Irregular periods 08/12/2011    Past Surgical History:   Procedure Laterality Date   NO PAST SURGERIES      OB History     Gravida  0   Para  0   Term  0   Preterm      AB  0   Living  0      SAB  0   IAB      Ectopic      Multiple      Live Births               Home Medications    Prior to Admission medications   Medication Sig Start Date End Date Taking? Authorizing Provider  amLODipine (NORVASC) 5 MG tablet Take 1 tablet (5 mg total) by mouth at bedtime. 04/16/21  Yes Dickie La, MD  diclofenac Sodium (VOLTAREN) 1 % GEL Apply 4 g topically 4 (four) times daily. 05/05/21  Yes Carollee Leitz, MD  DULoxetine (CYMBALTA) 60 MG capsule Take 1 capsule (60 mg total) by mouth daily. 01/15/21  Yes Zola Button, MD  gabapentin (NEURONTIN) 300 MG capsule Take 3 capsules (900 mg total) by mouth 2 (two) times daily. 01/15/21  Yes Zola Button, MD  glipiZIDE (GLUCOTROL) 5 MG tablet Take 1 tablet (5 mg total) by mouth daily before breakfast. 10/25/20  Yes Carollee Leitz, MD  methocarbamol (ROBAXIN) 500 MG tablet Take 1 tablet (500 mg total) by mouth every 8 (eight) hours as needed for muscle spasms. 06/21/21  Yes Raspet, Derry Skill,  PA-C  rosuvastatin (CRESTOR) 20 MG tablet Take 1 tablet (20 mg total) by mouth daily. 10/09/20  Yes Carollee Leitz, MD  traMADol Veatrice Bourbon) 50 MG tablet Take 2 tabs by mouth three times a day PRN back pain 02/19/21  Yes Dickie La, MD  blood glucose meter kit and supplies KIT Dispense based on patient and insurance preference. Use up to four times daily as directed. (FOR ICD-9 250.00, 250.01). 09/01/19   Carollee Leitz, MD  glucose blood test strip Use as instructed 09/19/19   Zenia Resides, MD  Lancet Devices MISC 1 Container by Does not apply route 2 (two) times daily as needed. As directed. 09/19/19   Zenia Resides, MD  losartan-hydrochlorothiazide (HYZAAR) 50-12.5 MG tablet Take 1 tablet by mouth daily. 04/16/21   Dickie La, MD  predniSONE (DELTASONE) 20 MG tablet Take 2 tablets (40 mg total) by mouth daily.  04/29/21   Dickie La, MD  albuterol (VENTOLIN HFA) 108 (90 Base) MCG/ACT inhaler Inhale 2 puffs into the lungs every 6 (six) hours as needed for wheezing or shortness of breath. 08/30/19 09/13/19  Carollee Leitz, MD    Family History Family History  Problem Relation Age of Onset   Cancer Father    Heart disease Sister    Other Neg Hx     Social History Social History   Tobacco Use   Smoking status: Former    Packs/day: 0.25    Types: Cigarettes    Quit date: 04/20/2014    Years since quitting: 7.1   Smokeless tobacco: Never  Vaping Use   Vaping Use: Some days  Substance Use Topics   Alcohol use: No    Alcohol/week: 0.0 standard drinks   Drug use: Yes    Types: Marijuana     Allergies   Patient has no known allergies.   Review of Systems Review of Systems  Constitutional:  Positive for activity change. Negative for appetite change, fatigue and fever.  Respiratory:  Negative for cough and shortness of breath.   Cardiovascular:  Negative for chest pain.  Gastrointestinal:  Negative for abdominal pain, diarrhea, nausea and vomiting.  Genitourinary:  Positive for frequency. Negative for dysuria, urgency, vaginal bleeding, vaginal discharge and vaginal pain.  Musculoskeletal:  Positive for back pain. Negative for arthralgias and myalgias.  Neurological:  Negative for dizziness, light-headedness and headaches.    Physical Exam Triage Vital Signs ED Triage Vitals  Enc Vitals Group     BP 06/21/21 1034 (!) 148/90     Pulse Rate 06/21/21 1034 98     Resp 06/21/21 1034 (!) 22     Temp 06/21/21 1034 99.1 F (37.3 C)     Temp Source 06/21/21 1034 Oral     SpO2 06/21/21 1034 98 %     Weight --      Height --      Head Circumference --      Peak Flow --      Pain Score 06/21/21 1036 10     Pain Loc --      Pain Edu? --      Excl. in [place]? --    No data found.  Updated Vital Signs BP (!) 148/90   Pulse 98   Temp 99.1 F (37.3 C) (Oral)   Resp (!) 22   LMP  06/09/2021 (Exact Date)   SpO2 98%   Visual Acuity Right Eye Distance:   Left Eye Distance:   Bilateral Distance:  Right Eye Near:   Left Eye Near:    Bilateral Near:     Physical Exam Vitals reviewed.  Constitutional:      General: She is awake. She is not in acute distress.    Appearance: Normal appearance. She is well-developed. She is not ill-appearing.     Comments: Very pleasant female appears stated age in no acute distress sitting comfortably in exam room  HENT:     Head: Normocephalic and atraumatic.  Cardiovascular:     Rate and Rhythm: Normal rate and regular rhythm.     Heart sounds: Normal heart sounds, S1 normal and S2 normal. No murmur heard. Pulmonary:     Effort: Pulmonary effort is normal.     Breath sounds: Normal breath sounds. No wheezing, rhonchi or rales.     Comments: Clear to auscultation bilaterally Abdominal:     General: Bowel sounds are normal.     Palpations: Abdomen is soft.     Tenderness: There is no abdominal tenderness. There is no right CVA tenderness, left CVA tenderness, guarding or rebound.  Musculoskeletal:     Cervical back: No tenderness or bony tenderness.     Thoracic back: Tenderness and bony tenderness present.     Lumbar back: Tenderness and bony tenderness present.  Psychiatric:        Behavior: Behavior is cooperative.     UC Treatments / Results  Labs (all labs ordered are listed, but only abnormal results are displayed) Labs Reviewed  HIV ANTIBODY (ROUTINE TESTING W REFLEX)  RPR  HEPATITIS C ANTIBODY  POC URINE PREG, ED  POCT URINALYSIS DIPSTICK, ED / UC  CERVICOVAGINAL ANCILLARY ONLY    EKG   Radiology No results found.  Procedures Procedures (including critical care time)  Medications Ordered in UC Medications  methylPREDNISolone acetate (DEPO-MEDROL) injection 60 mg (has no administration in time range)    Initial Impression / Assessment and Plan / UC Course  I have reviewed the triage vital  signs and the nursing notes.  Pertinent labs & imaging results that were available during my care of the patient were reviewed by me and considered in my medical decision making (see chart for details).     UA is normal with no evidence of infection.  Suspect that exacerbated back pain is musculoskeletal in etiology.  She was given Depo-Medrol 60 mg in clinic in the hopes of providing some pain relief.  She can continue previously prescribed medications including tramadol.  Review of [place] controlled substance database shows she should have ongoing availability of this medicine.  She was given Robaxin to use up to 3 times a day as needed with instruction not to drive or drink alcohol while taking this medication.  She did request STI testing and this was obtained today.  Will defer treatment until results available as she is currently asymptomatic.  Recommended she rest and drink plenty of fluid.  She is to follow-up with her PCP/pain management doctor for ongoing management.  Discussed potential utility of restarting physical therapy but patient reports this is impractical given the stress of her job.  Discussed that if she has any worsening symptoms including bowel/bladder incontinence, lower extremity weakness, saddle anesthesia, worsening back pain, fever she needs to be seen immediately.  Strict return precautions given to which she expressed understanding  Final Clinical Impressions(s) / UC Diagnoses   Final diagnoses:  Chronic left-sided low back pain with left-sided sciatica  Vaginal irritation  Routine screening for STI (sexually transmitted  infection)     Discharge Instructions      Your urine was normal.  I think your back pain is related to your slipped disc.  We gave you a shot of steroids today.  Please take methocarbamol up to 3 times a day.  This to make you sleepy so do not drive or drink alcohol with taking it.  Continue your previously prescribed medication.  Follow-up  with your pain management/PCP.  If anything worsens please return for reevaluation.     ED Prescriptions     Medication Sig Dispense Auth. Provider   methocarbamol (ROBAXIN) 500 MG tablet Take 1 tablet (500 mg total) by mouth every 8 (eight) hours as needed for muscle spasms. 30 tablet Edem Tiegs K, PA-C      I have reviewed the PDMP during this encounter.   Terrilee Croak, PA-C 06/21/21 1134

## 2021-06-22 LAB — RPR: RPR Ser Ql: NONREACTIVE

## 2021-06-23 ENCOUNTER — Telehealth: Payer: Self-pay

## 2021-06-23 ENCOUNTER — Ambulatory Visit (HOSPITAL_COMMUNITY)
Admission: EM | Admit: 2021-06-23 | Discharge: 2021-06-23 | Disposition: A | Discharge status: Home / Self Care | Payer: Commercial Managed Care - HMO | Attending: Family Medicine | Admitting: Family Medicine

## 2021-06-23 ENCOUNTER — Encounter (HOSPITAL_COMMUNITY): Payer: Self-pay | Admitting: *Deleted

## 2021-06-23 DIAGNOSIS — I10 Essential (primary) hypertension: Secondary | ICD-10-CM

## 2021-06-23 DIAGNOSIS — M5442 Lumbago with sciatica, left side: Secondary | ICD-10-CM | POA: Diagnosis not present

## 2021-06-23 LAB — CERVICOVAGINAL ANCILLARY ONLY
Bacterial Vaginitis (gardnerella): POSITIVE — AB
Candida Glabrata: NEGATIVE
Candida Vaginitis: NEGATIVE
Chlamydia: NEGATIVE
Comment: NEGATIVE
Comment: NEGATIVE
Comment: NEGATIVE
Comment: NEGATIVE
Comment: NEGATIVE
Comment: NORMAL
Neisseria Gonorrhea: NEGATIVE
Trichomonas: NEGATIVE

## 2021-06-23 MED ORDER — METRONIDAZOLE 500 MG PO TABS: 500.0000 mg | ORAL_TABLET | Freq: Two times a day (BID) | ORAL | 0 refills | Status: DC

## 2021-06-23 MED ORDER — PREDNISONE 20 MG PO TABS: 40.0000 mg | ORAL_TABLET | Freq: Every day | ORAL | 0 refills | Status: DC

## 2021-06-23 MED ORDER — KETOROLAC TROMETHAMINE 60 MG/2ML IM SOLN
60.0000 mg | Freq: Once | INTRAMUSCULAR | Status: AC
  Administered 2021-06-23: 60 mg via INTRAMUSCULAR

## 2021-06-23 MED ORDER — KETOROLAC TROMETHAMINE 60 MG/2ML IM SOLN
INTRAMUSCULAR | Status: AC
  Filled 2021-06-23: qty 2

## 2021-06-23 NOTE — ED Provider Notes (Signed)
[facility]   [id number] 06/23/21 Arrival Time: 1014  ASSESSMENT & PLAN:  1. Acute left-sided low back pain with left-sided sciatica   2. Elevated blood pressure reading in office with diagnosis of hypertension   Acute on chronic/recurring back pain. No s/s of HTN urgency. Able to ambulate here and hemodynamically stable. No indication for imaging of back at this time given no trauma and normal neurological exam.  Meds ordered this encounter  Medications   ketorolac (TORADOL) injection 60 mg   predniSONE (DELTASONE) 20 MG tablet    Sig: Take 2 tablets (40 mg total) by mouth daily.    Dispense:  10 tablet    Refill:  0     Discharge Instructions      Be aware, your blood sugars will run higher than normal while taking the prednisone.  Your blood pressure was noted to be elevated during your visit today. If you are currently taking medication for high blood pressure, please ensure you are taking this as directed. If you do not have a history of high blood pressure and your blood pressure remains persistently elevated, you may need to begin taking a medication at some point. You may return here within the next few days to recheck if unable to see your primary care provider or if you do not have a one.  BP (!) 185/121   Pulse 89   Temp 98.9 F (37.2 C)   Resp 18   LMP 06/09/2021 (Exact Date)   SpO2 100%   BP Readings from Last 3 Encounters:  06/23/21 (!) 185/121  06/21/21 (!) 148/90  05/05/21 136/84   Encourage ROM/movement as tolerated.  Recommend:  Follow-up Information     Schedule an appointment as soon as possible for a visit  with Dana Allan, MD.   Specialty: Family Medicine Why: To recheck and discuss your blood pressure. Contact information: [address] [place] [address] [phone]                 Reviewed expectations re: course of current medical issues. Questions answered. Outlined signs and symptoms indicating need for  more acute intervention. Patient verbalized understanding. After Visit Summary given.   SUBJECTIVE: History from: patient.  Lisa Cooley is a 36 y.o. female who presents with complaint of intermittent left sided lower back/buttock discomfort. Onset gradual. First noted  sev days ago; reports history of lumbar disc problems . Injury/trama: denied. Pain described as aching and with occas sharp pain radiating down L post leg. Aggravating factors: certain movements and prolonged walking/standing. Alleviating factors: have not been identified. Progressive LE weakness or saddle anesthesia: none. Extremity sensation changes or weakness: none. Ambulatory without difficulty. Normal bowel/bladder habits: yes; without urinary retention. Normal PO intake without n/v. No associated abdominal pain/n/v. Tramadol without much relief.  Increased blood pressure noted today. Reports that she is treated for HTN. Though reports taking lisinopril when Hyzaar is listed on medication list here. Prefers to resolve this discrepancy with PCP. She reports no chest pain on exertion, no dyspnea on exertion, no swelling of ankles, no orthostatic dizziness or lightheadedness, no orthopnea or paroxysmal nocturnal dyspnea, no palpitations, and no intermittent claudication symptoms.    OBJECTIVE:  Vitals:   06/23/21 1131  BP: (!) 185/121  Pulse: 89  Resp: 18  Temp: 98.9 F (37.2 C)  SpO2: 100%    BP noted.  General appearance: alert; no distress HEENT: [place]; AT Neck: supple with FROM; without midline tenderness CV: regular Lungs:  unlabored respirations; speaks full sentences without difficulty Abdomen: soft, non-tender; non-distended Back: moderate and poorly localized tenderness to palpation over L lumbar parasipnal musculature extending into buttock ; FROM at waist; without midline tenderness Extremities: without edema; symmetrical without gross deformities; normal ROM of bilateral LE Skin: warm and  dry Neurologic: normal gait; normal sensation and strength of bilateral LE Psychological: alert and cooperative; normal mood and affect   No Known Allergies  Past Medical History:  Diagnosis Date   Diabetes mellitus    Dx in [place] but not on meds   Headache(784.0)    Hypertension    Lumbar herniated disc    Sciatica    Social History   Socioeconomic History   Marital status: Single    Spouse name: Not on file   Number of children: Not on file   Years of education: Not on file   Highest education level: Not on file  Occupational History   Not on file  Tobacco Use   Smoking status: Former    Packs/day: 0.25    Types: Cigarettes    Quit date: 04/20/2014    Years since quitting: 7.1   Smokeless tobacco: Never  Vaping Use   Vaping Use: Some days  Substance and Sexual Activity   Alcohol use: No    Alcohol/week: 0.0 standard drinks   Drug use: Yes    Types: Marijuana   Sexual activity: Yes    Birth control/protection: Condom  Other Topics Concern   Not on file  Social History Narrative   Not on file   Social Determinants of Health   Financial Resource Strain: Not on file  Food Insecurity: Not on file  Transportation Needs: Not on file  Physical Activity: Not on file  Stress: Not on file  Social Connections: Not on file  Intimate Partner Violence: Not on file   Family History  Problem Relation Age of Onset   Cancer Father    Heart disease Sister    Other Neg Hx    Past Surgical History:  Procedure Laterality Date   NO PAST SURGERIES        Mardella Layman, MD 06/23/21 1304

## 2021-06-23 NOTE — ED Triage Notes (Signed)
Pt reports lower back pain due to a slipped disc.Pt reports feels like muscle spasams.

## 2021-06-23 NOTE — Discharge Instructions (Addendum)
Be aware, your blood sugars will run higher than normal while taking the prednisone.  Your blood pressure was noted to be elevated during your visit today. If you are currently taking medication for high blood pressure, please ensure you are taking this as directed. If you do not have a history of high blood pressure and your blood pressure remains persistently elevated, you may need to begin taking a medication at some point. You may return here within the next few days to recheck if unable to see your primary care provider or if you do not have a one.  BP (!) 185/121   Pulse 89   Temp 98.9 F (37.2 C)   Resp 18   LMP 06/09/2021 (Exact Date)   SpO2 100%   BP Readings from Last 3 Encounters:  06/23/21 (!) 185/121  06/21/21 (!) 148/90  05/05/21 136/84

## 2021-07-08 ENCOUNTER — Encounter: Payer: Self-pay | Admitting: *Deleted

## 2021-07-09 ENCOUNTER — Encounter: Payer: Self-pay | Admitting: Family Medicine

## 2021-07-23 ENCOUNTER — Ambulatory Visit (HOSPITAL_COMMUNITY): Payer: Commercial Managed Care - HMO

## 2021-07-25 ENCOUNTER — Encounter (HOSPITAL_COMMUNITY): Payer: Self-pay

## 2021-07-25 ENCOUNTER — Ambulatory Visit (HOSPITAL_COMMUNITY)
Admission: EM | Admit: 2021-07-25 | Discharge: 2021-07-25 | Disposition: A | Discharge status: Home / Self Care | Payer: Commercial Managed Care - HMO | Attending: Physician Assistant | Admitting: Physician Assistant

## 2021-07-25 DIAGNOSIS — M5442 Lumbago with sciatica, left side: Secondary | ICD-10-CM | POA: Diagnosis present

## 2021-07-25 DIAGNOSIS — B3731 Acute candidiasis of vulva and vagina: Secondary | ICD-10-CM | POA: Insufficient documentation

## 2021-07-25 DIAGNOSIS — G8929 Other chronic pain: Secondary | ICD-10-CM | POA: Insufficient documentation

## 2021-07-25 DIAGNOSIS — N760 Acute vaginitis: Secondary | ICD-10-CM | POA: Insufficient documentation

## 2021-07-25 MED ORDER — FLUCONAZOLE 150 MG PO TABS: 150.0000 mg | ORAL_TABLET | Freq: Every day | ORAL | 0 refills | Status: DC

## 2021-07-25 NOTE — ED Provider Notes (Addendum)
[facility]    CSN: [id number] Arrival date & time: 07/25/21  7829      History   Chief Complaint Chief Complaint  Patient presents with   Vaginal Itching   Back Pain    HPI Lisa Cooley is a 36 y.o. female.   36 year old female presents with vaginal irritation lower back pain.  Patient relates for the past several weeks she has been having persistent vaginal irritation, burning, raw feeling.  Patient relates that she does not have any vaginal discharge, and no odor.  Patient relates that she was treated several weeks ago for bacterial vaginitis, she took the Flagyl although only intermittently and did not complete the medication dose regimen.  Patient relates that she is not having any urinary symptoms, no frequency, urgency, or dysuria.  Patient relates she does have some mild lower suprapubic pressure and tenderness.  Patient relates she does not have any fever or chills. Patient also relates that she has a history of having chronic lower back pain with sciatica which radiates down into the left side.  Patient relates that just gives her problems and intermittent flares.  Patient relates she has been having some lower back pain over the past couple weeks.  Patient does see an orthopedist, and has been evaluated in the past, recommended surgery.  Patient relates her insurance company denied surgical intervention.  Patient relates she does take ibuprofen 600 mg and tramadol as needed and this only partially relieves her symptoms. Patient relates that she does intermittently take her blood pressure medicine and her diabetic medicine, she does relate that her glucose has been elevated over the past several weeks.  Patient relates the last time she took her glucose level was several days ago and it registered high on the machine.  Patient relates she has not taken her blood pressure medicine in the past month.  Patient does relate she has an appointment to see her family  practice provider on June 27.   Vaginal Itching  Back Pain   Past Medical History:  Diagnosis Date   Diabetes mellitus    Dx in [place] but not on meds   Headache(784.0)    Hypertension    Lumbar herniated disc    Sciatica     Patient Active Problem List   Diagnosis Date Noted   Severe dysmenorrhea 04/10/2021   Left wrist pain 04/10/2021   Protrusion of lumbar intervertebral disc 01/30/2021   Hypertension associated with diabetes (HCC) 08/18/2018   Healthcare maintenance 08/18/2018   Screening for malignant neoplasm of cervix 12/20/2017   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   DM2 (diabetes mellitus, type 2) (HCC) 08/12/2011   Morbid obesity (HCC) 08/12/2011   Infertility, female 08/12/2011   Irregular periods 08/12/2011    Past Surgical History:  Procedure Laterality Date   NO PAST SURGERIES      OB History     Gravida  0   Para  0   Term  0   Preterm      AB  0   Living  0      SAB  0   IAB      Ectopic      Multiple      Live Births               Home Medications    Prior to Admission medications   Medication Sig Start Date End Date Taking? Authorizing Provider  blood glucose meter kit and supplies KIT  Dispense based on patient and insurance preference. Use up to four times daily as directed. (FOR ICD-9 250.00, 250.01). 09/01/19   Dana Allan, MD  diclofenac Sodium (VOLTAREN) 1 % GEL Apply 4 g topically 4 (four) times daily. 05/05/21   Dana Allan, MD  DULoxetine (CYMBALTA) 60 MG capsule Take 1 capsule (60 mg total) by mouth daily. 01/15/21   [name], MD  gabapentin (NEURONTIN) 300 MG capsule Take 3 capsules (900 mg total) by mouth 2 (two) times daily. 01/15/21   [name], MD  glipiZIDE (GLUCOTROL) 5 MG tablet Take 1 tablet (5 mg total) by mouth daily before breakfast. 10/25/20   Dana Allan, MD  glucose blood test strip Use as instructed 09/19/19   Moses Manners, MD  Lancet Devices MISC 1 Container by Does not  apply route 2 (two) times daily as needed. As directed. 09/19/19   Moses Manners, MD  losartan-hydrochlorothiazide (HYZAAR) 50-12.5 MG tablet Take 1 tablet by mouth daily. 04/16/21   Nestor Ramp, MD  methocarbamol (ROBAXIN) 500 MG tablet Take 1 tablet (500 mg total) by mouth every 8 (eight) hours as needed for muscle spasms. 06/21/21   Raspet, Noberto Retort, PA-C  metroNIDAZOLE (FLAGYL) 500 MG tablet Take 1 tablet (500 mg total) by mouth 2 (two) times daily. 06/23/21   Lamptey, Britta Mccreedy, MD  predniSONE (DELTASONE) 20 MG tablet Take 2 tablets (40 mg total) by mouth daily. 06/23/21   Mardella Layman, MD  rosuvastatin (CRESTOR) 20 MG tablet Take 1 tablet (20 mg total) by mouth daily. 10/09/20   Dana Allan, MD  albuterol (VENTOLIN HFA) 108 (90 Base) MCG/ACT inhaler Inhale 2 puffs into the lungs every 6 (six) hours as needed for wheezing or shortness of breath. 08/30/19 09/13/19  Dana Allan, MD    Family History Family History  Problem Relation Age of Onset   Cancer Father    Heart disease Sister    Other Neg Hx     Social History Social History   Tobacco Use   Smoking status: Former    Packs/day: 0.25    Types: Cigarettes    Quit date: 04/20/2014    Years since quitting: 7.2   Smokeless tobacco: Never  Vaping Use   Vaping Use: Some days  Substance Use Topics   Alcohol use: No    Alcohol/week: 0.0 standard drinks of alcohol   Drug use: Yes    Types: Marijuana     Allergies   Patient has no known allergies.   Review of Systems Review of Systems  Genitourinary:  Positive for vaginal pain (irritation and burning).  Musculoskeletal:  Positive for back pain.     Physical Exam Triage Vital Signs ED Triage Vitals  Enc Vitals Group     BP 07/25/21 0855 (!) 160/97     Pulse Rate 07/25/21 0855 93     Resp 07/25/21 0855 16     Temp 07/25/21 0855 98.6 F (37 C)     Temp Source 07/25/21 0855 Oral     SpO2 07/25/21 0855 97 %     Weight --      Height --      Head Circumference --       Peak Flow --      Pain Score 07/25/21 0856 8     Pain Loc --      Pain Edu? --      Excl. in GC? --    No data found.  Updated Vital Signs BP [name])  160/97 (BP Location: Right Arm)   Pulse 93   Temp 98.6 F (37 C) (Oral)   Resp 16   LMP 07/08/2021 (Exact Date)   SpO2 97%   Visual Acuity Right Eye Distance:   Left Eye Distance:   Bilateral Distance:    Right Eye Near:   Left Eye Near:    Bilateral Near:     Physical Exam Constitutional:      Appearance: Normal appearance.  Abdominal:     General: Abdomen is flat. Bowel sounds are normal.     Palpations: Abdomen is soft.     Tenderness: There is abdominal tenderness (mild on palpation suprapubic areas). There is no guarding or rebound.  Genitourinary:    Comments: Pelvic: The labia minora has mild redness and irritation in the inferior aspects bilaterally.  Labia majora appears normal the vaginal canal has small amount of white thick discharge present..  The vaginal canal is otherwise normal. Musculoskeletal:     Lumbar back: Tenderness (L5/S1 area) present. No swelling or edema.  Neurological:     Mental Status: She is alert.      UC Treatments / Results  Labs (all labs ordered are listed, but only abnormal results are displayed) Labs Reviewed  CERVICOVAGINAL ANCILLARY ONLY    EKG   Radiology No results found.  Procedures Procedures (including critical care time)  Medications Ordered in UC Medications - No data to display  Initial Impression / Assessment and Plan / UC Course  I have reviewed the triage vital signs and the nursing notes.  Pertinent labs & imaging results that were available during my care of the patient were reviewed by me and considered in my medical decision making (see chart for details).    Plan: 1.  Culture for GC/chlamydia/trichomonas/BV/and yeast is pending. 2.  Advised patient to follow-up with her family practice provider on June 27, help good control of the blood pressure  and diabetes. 3.  Patient advised to continue to take the ibuprofen and tramadol as she needs it to help control the lower back pain and sciatica. 4.  Patient advised to take the Diflucan 150 mg 1 tablet a day, and then repeat in 5 to 7 days if symptoms fail to improve.  5.  Patient advised to return to follow-up with PCP or return to urgent care if symptoms fail to improve. Final Clinical Impressions(s) / UC Diagnoses   Final diagnoses:  Chronic midline low back pain with left-sided sciatica  Vaginitis and vulvovaginitis  Vaginal candidiasis     Discharge Instructions      Patient advised to keep her appointment with her family medicine provider on June 27. Patient advised to take the ibuprofen and tramadol as she needs it to help control her back pain. Patient advised to take her blood pressure medicine on a daily basis and to take her diabetic medicine on a daily basis and to check her sugars routinely. Patient advised to use medication as directed. Advised to take the Diflucan 150 mg tablet, 1 today, and then may repeat in 5 to 7 days if symptoms fail to improve.     ED Prescriptions   None    PDMP not reviewed this encounter.   Ellsworth Lennox, PA-C 07/25/21 9528    Ellsworth Lennox, PA-C 07/25/21 1004

## 2021-07-28 LAB — CERVICOVAGINAL ANCILLARY ONLY
Bacterial Vaginitis (gardnerella): POSITIVE — AB
Candida Glabrata: NEGATIVE
Candida Vaginitis: POSITIVE — AB
Chlamydia: NEGATIVE
Comment: NEGATIVE
Comment: NEGATIVE
Comment: NEGATIVE
Comment: NEGATIVE
Comment: NEGATIVE
Comment: NORMAL
Neisseria Gonorrhea: NEGATIVE
Trichomonas: NEGATIVE

## 2021-07-29 ENCOUNTER — Telehealth (HOSPITAL_COMMUNITY): Payer: Self-pay | Admitting: Emergency Medicine

## 2021-07-29 ENCOUNTER — Encounter: Payer: Self-pay | Admitting: Family Medicine

## 2021-07-29 ENCOUNTER — Ambulatory Visit (INDEPENDENT_AMBULATORY_CARE_PROVIDER_SITE_OTHER): Payer: Commercial Managed Care - HMO | Admitting: Family Medicine

## 2021-07-29 VITALS — BP 138/90 | HR 74 | Ht 67.0 in | Wt 365.0 lb

## 2021-07-29 DIAGNOSIS — I152 Hypertension secondary to endocrine disorders: Secondary | ICD-10-CM

## 2021-07-29 DIAGNOSIS — E1159 Type 2 diabetes mellitus with other circulatory complications: Secondary | ICD-10-CM

## 2021-07-29 DIAGNOSIS — M5410 Radiculopathy, site unspecified: Secondary | ICD-10-CM | POA: Diagnosis not present

## 2021-07-29 DIAGNOSIS — E119 Type 2 diabetes mellitus without complications: Secondary | ICD-10-CM

## 2021-07-29 LAB — POCT GLYCOSYLATED HEMOGLOBIN (HGB A1C): HbA1c, POC (controlled diabetic range): 8.6 % — AB (ref 0.0–7.0)

## 2021-07-29 MED ORDER — IBUPROFEN 600 MG PO TABS: 600.0000 mg | ORAL_TABLET | Freq: Three times a day (TID) | ORAL | 0 refills | Status: DC | PRN

## 2021-07-29 MED ORDER — DULOXETINE HCL 60 MG PO CPEP: 60.0000 mg | ORAL_CAPSULE | Freq: Every day | ORAL | 0 refills | Status: AC

## 2021-07-29 MED ORDER — METHOCARBAMOL 500 MG PO TABS: 500.0000 mg | ORAL_TABLET | Freq: Three times a day (TID) | ORAL | 0 refills | Status: DC | PRN

## 2021-07-29 MED ORDER — METRONIDAZOLE 0.75 % VA GEL: 1.0000 | Freq: Every day | VAGINAL | 0 refills | Status: AC

## 2021-07-29 MED ORDER — SEMAGLUTIDE(0.25 OR 0.5MG/DOS) 2 MG/1.5ML ~~LOC~~ SOPN: 0.2500 mg | PEN_INJECTOR | SUBCUTANEOUS | 0 refills | Status: DC

## 2021-07-29 MED ORDER — ROSUVASTATIN CALCIUM 20 MG PO TABS: 20.0000 mg | ORAL_TABLET | Freq: Every day | ORAL | 1 refills | Status: DC

## 2021-07-29 MED ORDER — METFORMIN HCL ER 500 MG PO TB24: 500.0000 mg | ORAL_TABLET | Freq: Every day | ORAL | 0 refills | Status: DC

## 2021-07-29 NOTE — Progress Notes (Signed)
    SUBJECTIVE:   CHIEF COMPLAINT / HPI: Hospital follow  Patient presents to clinic today for ED follow-up on 06/23.  Was seen for chronic low back pain with left sciatica.  Was advised to take ibuprofen and tramadol for which she has to pick up at pharmacy.  She is requesting refill of her duloxetine as well as her methocarbamol.  Diabetes type 2 Currently asymptomatic.  She reports compliancy with glipizide 5 mg daily.  Has run out of metformin.  Does not check sugars at home.  Denies any visual changes, chest pain, shortness of breath, abdominal pain, nausea/vomiting or lower extremity edema.  Hypertension She reports she was taking lisinopril and is unsure what medication she is taking now any.  She denies any visual changes, chest pain or shortness of breath.  Does not check blood pressures at home.   PERTINENT  PMH / PSH:  Obesity class III Diabetes type 2 Hypertension Hyperlipidemia   OBJECTIVE:   BP 138/90   Pulse 74   Ht 5\' 7"  (1.702 m)   Wt (!) 365 lb (165.6 kg)   LMP 07/08/2021 (Exact Date)   SpO2 98%   BMI 57.17 kg/m    General: Alert, no acute distress Cardio: Normal S1 and S2, RRR, no r/m/g Pulm: CTAB, normal work of breathing Abdomen: Bowel sounds normal. Abdomen soft and non-tender.  Extremities: No peripheral edema.    ASSESSMENT/PLAN:   DM2 (diabetes mellitus, type 2) (HCC) A1c today 8.6 Will start Ozempic 25 mg weekly Continue glipizide 5 mg daily Restart metformin XR 500 mg daily Refill Crestor Follow-up with PCP in 4-weeks  Radicular syndrome of lower limbs Advised to pick up her tramadol at pharmacy.  Continue ibuprofen, gabapentin Refill methocarbamol Refill duloxetine Follow-up with PCP as needed.  Hypertension associated with diabetes (HCC) Not at goal.  Discussed with patient that she should be taking Hyzaar for blood pressure. Restart Hyzaar 50-12.5 mg daily Follow-up PCP in 4 weeks, will recheck BMP at that time.     09/07/2021, MD [facility]

## 2021-07-30 ENCOUNTER — Other Ambulatory Visit: Payer: Self-pay | Admitting: *Deleted

## 2021-07-30 NOTE — Telephone Encounter (Signed)
Fax received from pharmacy and ozempic need to be ordered for 2mg /70ml solution. This would be a change from the original script of 2mg /1.40ml solution pen.  Will forward to MD. 

## 2021-07-31 ENCOUNTER — Other Ambulatory Visit: Payer: Self-pay | Admitting: Family Medicine

## 2021-07-31 MED ORDER — SEMAGLUTIDE(0.25 OR 0.5MG/DOS) 2 MG/1.5ML ~~LOC~~ SOPN: 0.2500 mg | PEN_INJECTOR | SUBCUTANEOUS | 0 refills | Status: DC

## 2021-08-01 ENCOUNTER — Encounter: Payer: Self-pay | Admitting: Family Medicine

## 2021-08-01 MED ORDER — SEMAGLUTIDE(0.25 OR 0.5MG/DOS) 2 MG/3ML ~~LOC~~ SOPN: 0.2500 mg | PEN_INJECTOR | SUBCUTANEOUS | 1 refills | Status: DC

## 2021-08-01 NOTE — Assessment & Plan Note (Signed)
Not at goal.  Discussed with patient that she should be taking Hyzaar for blood pressure. Restart Hyzaar 50-12.5 mg daily Follow-up PCP in 4 weeks, will recheck BMP at that time.

## 2021-08-01 NOTE — Assessment & Plan Note (Addendum)
A1c today 8.6 Will start Ozempic 25 mg weekly Continue glipizide 5 mg daily Restart metformin XR 500 mg daily Refill Crestor Follow-up with PCP in 4-weeks

## 2021-08-01 NOTE — Assessment & Plan Note (Signed)
Advised to pick up her tramadol at pharmacy.  Continue ibuprofen, gabapentin Refill methocarbamol Refill duloxetine Follow-up with PCP as needed.

## 2021-08-04 ENCOUNTER — Telehealth: Payer: Self-pay

## 2021-08-04 ENCOUNTER — Other Ambulatory Visit: Payer: Self-pay | Admitting: Student

## 2021-08-04 ENCOUNTER — Other Ambulatory Visit (HOSPITAL_COMMUNITY): Payer: Self-pay

## 2021-08-04 MED ORDER — METFORMIN HCL ER 500 MG PO TB24: 500.0000 mg | ORAL_TABLET | Freq: Every day | ORAL | 0 refills | Status: DC

## 2021-08-04 NOTE — Telephone Encounter (Signed)
Rec'd fax from pharmacy regarding Ozempic.   Medication is not on pt's formulary.   Alternative choices: metformin, byetta, trulicity, or bydureon.  Please send an alternative.

## 2021-08-06 ENCOUNTER — Other Ambulatory Visit: Payer: Self-pay | Admitting: Family Medicine

## 2021-09-10 ENCOUNTER — Telehealth: Payer: Self-pay

## 2021-09-10 MED ORDER — ONETOUCH VERIO VI STRP
ORAL_STRIP | 12 refills | Status: AC
Start: 2021-09-10 — End: ?

## 2021-09-10 MED ORDER — ONETOUCH DELICA PLUS LANCING MISC: 3 refills | Status: AC

## 2021-09-10 MED ORDER — ONETOUCH VERIO W/DEVICE KIT: PACK | 0 refills | Status: AC

## 2021-09-10 MED ORDER — ONETOUCH DELICA PLUS LANCET33G MISC: 12 refills | Status: AC

## 2021-09-10 NOTE — Telephone Encounter (Signed)
Patient calls nurse line for multiple reasons.  Patient is requesting new glucometer and supplies. See pended orders.  Patient also reports elevated BP reading this morning of 175/118 with slight headache. Denies current chest pain, blurry vision or SHOB. Reports that she will sometimes have intermittent chest pressure when she does not take her medication. Last dose of Lisinopril-HCTZ was yesterday. Patient reports that she is not compliant with medication as it keeps her up at night due to increased urination. Advised that patient should be seen for BP follow up.  Patient also reports abscess under right arm. She states that it has been draining yellow-white discharge. Denies odor or fever. She has been having issues with re occurring abscess under arm for the last month.  Scheduled patient appointment for tomorrow morning. ED precautions discussed. Patient verbalizes understanding.   Veronda Prude, RN

## 2021-09-11 ENCOUNTER — Ambulatory Visit (INDEPENDENT_AMBULATORY_CARE_PROVIDER_SITE_OTHER): Payer: Commercial Managed Care - HMO | Admitting: Family Medicine

## 2021-09-11 VITALS — BP 154/112 | HR 79 | Ht 67.0 in | Wt 364.2 lb

## 2021-09-11 DIAGNOSIS — I152 Hypertension secondary to endocrine disorders: Secondary | ICD-10-CM | POA: Diagnosis not present

## 2021-09-11 DIAGNOSIS — E1159 Type 2 diabetes mellitus with other circulatory complications: Secondary | ICD-10-CM

## 2021-09-11 DIAGNOSIS — L989 Disorder of the skin and subcutaneous tissue, unspecified: Secondary | ICD-10-CM | POA: Diagnosis not present

## 2021-09-11 MED ORDER — CLINDAMYCIN HCL MONOHYDRATE POWD: Freq: Every day | 1 refills | Status: DC

## 2021-09-11 NOTE — Assessment & Plan Note (Signed)
New problem.  Acutely draining abscess, most congruent with hidradenitis suppurativa given distribution and chronicity.  Rx clindamycin powder for daily use.  Suggest booking appointment with dermatology clinic here at [facility] for I&D.

## 2021-09-11 NOTE — Progress Notes (Signed)
   SUBJECTIVE:   CHIEF COMPLAINT / HPI:   HTN -Currently prescribed Losartan-HCTZ 50-12.5 mg, is taking at bedtime -Describes intermittent compliance, does not take every night because she dislikes getting up to urinate multiple times throughout the night -Endorses headaches, blurry visions, dry mouth and eyes -Sometimes she stops taking her medications to see if this will make her feel better -Takes BP at home on wrist cuff: This AM at home 218/132 on wrist cuff Monday 110/67 on wrist cuff mid afternoon, 12:53pm -Last CMP 05/05/2021 -Previously prescribed amlodipine 5 mg as well, patient currently not taking, the may be some confusion regarding medications  BP Readings from Last 3 Encounters:  09/11/21 (!) 154/112  07/29/21 138/90  07/25/21 (!) 160/97    Skin lesion - Intermittently worsening right axillary skin lesion - Will become red, painful, swollen and draining - It is currently draining today, a white-ish clearish fluid - Also has similar lesions in left axilla and under bilateral breasts -Never before diagnosed with hidradenitis suppurativa, has never been on any medications for this  PERTINENT  PMH / PSH: HTN, T2DM, BMI 57, severe dysmenorrhea, lumbar disc protrusion, radicular syndrome of lower limbs  OBJECTIVE:   BP (!) 154/112   Pulse 79   Ht 5\' 7"  (1.702 m)   Wt (!) 364 lb 4 oz (165.2 kg)   LMP 09/02/2021   SpO2 96%   BMI 57.05 kg/m    PHQ-9:     09/11/2021   10:20 AM 07/29/2021   11:16 AM 05/05/2021   10:30 AM  Depression screen PHQ 2/9  Decreased Interest 0 1 3  Down, Depressed, Hopeless 0 1 1  PHQ - 2 Score 0 2 4  Altered sleeping 0 1 1  Tired, decreased energy 0 1 3  Change in appetite 0 1 0  Feeling bad or failure about yourself  0 0 0  Trouble concentrating 0 1 1  Moving slowly or fidgety/restless 0 0 1  Suicidal thoughts 0 0 0  PHQ-9 Score 0 6 10    Physical Exam General: Awake, alert, oriented, no acute distress Respiratory: Unlabored  respirations, speaking in full sentences, no respiratory distress Skin: Acutely draining right axillary abscess, no appreciable erythema or warmth surrounding.  Right and left axilla with hypopigmented circular scarring from similar lesions.  ASSESSMENT/PLAN:   Hypertension associated with diabetes (HCC) Uncontrolled.  While patient does endorse headache and blurry vision, this is a chronic manifestation of a chronic problem.  She has no other red flags including no neurological deficits or altered mental status.  I do not believe she needs urgent imaging or workup at this time.  Patient counseled on daily use of losartan-HCTZ, can take in the morning to avoid frequent nocturnal urination.  BMP collected today.  Patient to follow-up with PCP in 1 to 2 weeks, hopeful for BP improvement after daily medication. Return precautions given, see AVS for more.  Could consider ambulatory BP cuff with Dr. 4/9, however may be difficult due to habitus.  Skin lesion New problem.  Acutely draining abscess, most congruent with hidradenitis suppurativa given distribution and chronicity.  Rx clindamycin powder for daily use.  Suggest booking appointment with dermatology clinic here at [facility] for I&D.     [facility], MD [facility]

## 2021-09-11 NOTE — Assessment & Plan Note (Signed)
Uncontrolled.  While patient does endorse headache and blurry vision, this is a chronic manifestation of a chronic problem.  She has no other red flags including no neurological deficits or altered mental status.  I do not believe she needs urgent imaging or workup at this time.  Patient counseled on daily use of losartan-HCTZ, can take in the morning to avoid frequent nocturnal urination.  BMP collected today.  Patient to follow-up with PCP in 1 to 2 weeks, hopeful for BP improvement after daily medication. Return precautions given, see AVS for more.  Could consider ambulatory BP cuff with Dr. Raymondo Band, however may be difficult due to habitus.

## 2021-09-11 NOTE — Patient Instructions (Addendum)
It was wonderful to meet you today. Thank you for allowing me to be a part of your care. Below is a short summary of what we discussed at your visit today:  Blood pressure Take your blood pressure medicine every day in the morning.  Come back in one week to meet with Dr. Melissa Noon or Dr. Larita Fife and check on your blood pressure Schedule an appointment with Dr. Raymondo Band for an ambulatory blood pressure cuff  Hidradenitis Please schedule an appointment with our dermatology clinic to have this removed Apply the clindamycin powder to your underarms daily.    Please bring all of your medications to every appointment!  If you have any questions or concerns, please do not hesitate to contact us via phone or MyChart message.   Fayette Pho, MD

## 2021-09-12 ENCOUNTER — Telehealth: Payer: Self-pay | Admitting: *Deleted

## 2021-09-12 DIAGNOSIS — L989 Disorder of the skin and subcutaneous tissue, unspecified: Secondary | ICD-10-CM

## 2021-09-12 LAB — BASIC METABOLIC PANEL
BUN/Creatinine Ratio: 15 (ref 9–23)
BUN: 10 mg/dL (ref 6–20)
CO2: 24 mmol/L (ref 20–29)
Calcium: 9.7 mg/dL (ref 8.7–10.2)
Chloride: 99 mmol/L (ref 96–106)
Creatinine, Ser: 0.65 mg/dL (ref 0.57–1.00)
Glucose: 138 mg/dL — ABNORMAL HIGH (ref 70–99)
Potassium: 4 mmol/L (ref 3.5–5.2)
Sodium: 137 mmol/L (ref 134–144)
eGFR: 118 mL/min/{1.73_m2} (ref >59)

## 2021-09-12 MED ORDER — CLINDAMYCIN PHOSPHATE 1 % EX LOTN: TOPICAL_LOTION | Freq: Two times a day (BID) | CUTANEOUS | 2 refills | Status: DC

## 2021-09-12 NOTE — Telephone Encounter (Signed)
Received fax from pharmacy, clindamycin monohydrate powder is not available to order or dispense.  Please advise  Jone Baseman, CMA

## 2021-09-12 NOTE — Telephone Encounter (Signed)
Will send in clindamycin cream instead.  Fayette Pho, MD

## 2021-09-15 ENCOUNTER — Ambulatory Visit: Payer: Commercial Managed Care - HMO | Admitting: Student

## 2021-09-24 ENCOUNTER — Ambulatory Visit: Payer: Commercial Managed Care - HMO | Admitting: Family Medicine

## 2021-09-25 ENCOUNTER — Ambulatory Visit (INDEPENDENT_AMBULATORY_CARE_PROVIDER_SITE_OTHER): Payer: Commercial Managed Care - HMO | Admitting: Student

## 2021-09-25 VITALS — BP 145/82 | HR 100 | Temp 99.0°F | Ht 67.0 in | Wt 367.6 lb

## 2021-09-25 DIAGNOSIS — L732 Hidradenitis suppurativa: Secondary | ICD-10-CM

## 2021-09-25 MED ORDER — DOXYCYCLINE HYCLATE 100 MG PO TABS: 100.0000 mg | ORAL_TABLET | Freq: Two times a day (BID) | ORAL | 0 refills | Status: AC

## 2021-09-25 NOTE — Patient Instructions (Signed)
It was great to see you! Thank you for allowing me to participate in your care!   Our plans for today:  - I have prescribed doxycycline for you twice daily for 14 days - Use antibacterial soap on your body   Take care and seek immediate care sooner if you develop any concerns.  Levin Erp, MD

## 2021-09-25 NOTE — Assessment & Plan Note (Signed)
Likely Hurley stage I through history and examination. Has not been able to try topical treatments. Was referred for I&D however no fluctuance on exam and not a recommended treatment for HS as it can worsen tunneling. Will opt to treat with doxycycline oral course BID for 14 days. [facility] price told to patient before leaving of $9-patient amenable.  -F/u as needed -Antibacterial soap recommended -Handout provided

## 2021-09-25 NOTE — Progress Notes (Signed)
    SUBJECTIVE:   CHIEF COMPLAINT / HPI: Draining skin lesion  Seen on 09/11/21 for skin abscess under right arm - possibly hidradenitis suppurativa per provider.  Here due to possible need for I&D.  Today she says it is draining clear fluid. She says it has been going on for a few weeks and that it comes and goes with the amount of drainage it has. Originally had purulent drainage from the wound. She ahs had lesions under her armpits in the past as well as under her breast. She has not been able to try the clindamycin cream or powder due to medication cost issues.  PERTINENT  PMH / PSH: obesity  OBJECTIVE:   BP (!) 145/82   Pulse 100   Temp 99 F (37.2 C)   Ht 5\' 7"  (1.702 m)   Wt (!) 367 lb 9.6 oz (166.7 kg)   LMP 09/05/2021 (Approximate)   SpO2 96%   BMI 57.57 kg/m   General: NAD, awake, alert, responsive to questions Head: Normocephalic atraumatic Respiratory: chest rises symmetrically,  no increased work of breathing Axilla: Right axilla with clear fluid draining lesion present, some induration medially but no fluctuance. No erythema or warmth. Mildly tender to palpation. <1cm pustule present below this draining lesion. Left armpit with some scarring present but no active lesions.    (Contains some remnants of gauze previously on--not packing)   ASSESSMENT/PLAN:    Hidradenitis suppurativa Likely Hurley stage I through history and examination. Has not been able to try topical treatments. Was referred for I&D however no fluctuance on exam and not a recommended treatment for HS as it can worsen tunneling. Will opt to treat with doxycycline oral course BID for 14 days. [facility] price told to patient before leaving of $9-patient amenable.  -F/u as needed -Antibacterial soap recommended -Handout provided   11/05/2021, MD [facility]

## 2021-11-27 ENCOUNTER — Other Ambulatory Visit: Payer: Self-pay

## 2021-11-27 MED ORDER — METHOCARBAMOL 500 MG PO TABS: 500.0000 mg | ORAL_TABLET | Freq: Three times a day (TID) | ORAL | 0 refills | Status: DC | PRN

## 2021-12-23 ENCOUNTER — Other Ambulatory Visit (HOSPITAL_COMMUNITY): Payer: Self-pay

## 2021-12-23 ENCOUNTER — Other Ambulatory Visit (HOSPITAL_COMMUNITY)
Admission: RE | Admit: 2021-12-23 | Discharge: 2021-12-23 | Disposition: A | Discharge status: Home / Self Care | Payer: Commercial Managed Care - HMO | Source: Ambulatory Visit | Attending: Family Medicine | Admitting: Family Medicine

## 2021-12-23 ENCOUNTER — Encounter: Payer: Self-pay | Admitting: Student

## 2021-12-23 ENCOUNTER — Ambulatory Visit (INDEPENDENT_AMBULATORY_CARE_PROVIDER_SITE_OTHER): Payer: Commercial Managed Care - HMO | Admitting: Student

## 2021-12-23 VITALS — BP 143/94 | HR 94 | Ht 67.0 in | Wt 368.0 lb

## 2021-12-23 DIAGNOSIS — M5410 Radiculopathy, site unspecified: Secondary | ICD-10-CM

## 2021-12-23 DIAGNOSIS — Z113 Encounter for screening for infections with a predominantly sexual mode of transmission: Secondary | ICD-10-CM | POA: Insufficient documentation

## 2021-12-23 DIAGNOSIS — E1159 Type 2 diabetes mellitus with other circulatory complications: Secondary | ICD-10-CM

## 2021-12-23 DIAGNOSIS — I152 Hypertension secondary to endocrine disorders: Secondary | ICD-10-CM

## 2021-12-23 DIAGNOSIS — E119 Type 2 diabetes mellitus without complications: Secondary | ICD-10-CM

## 2021-12-23 LAB — POCT WET PREP (WET MOUNT)
Clue Cells Wet Prep Whiff POC: NEGATIVE
Trichomonas Wet Prep HPF POC: ABSENT

## 2021-12-23 MED ORDER — TRAMADOL HCL 50 MG PO TABS
50.0000 mg | ORAL_TABLET | Freq: Two times a day (BID) | ORAL | 0 refills | Status: AC | PRN
Start: 2021-12-23 — End: 2022-01-22
  Filled 2021-12-23: qty 60, 30d supply, fill #0

## 2021-12-23 MED ORDER — LOSARTAN POTASSIUM-HCTZ 50-12.5 MG PO TABS
1.0000 | ORAL_TABLET | Freq: Every day | ORAL | 3 refills | Status: DC
  Filled 2021-12-23: qty 90, 90d supply, fill #0

## 2021-12-23 MED ORDER — ROSUVASTATIN CALCIUM 20 MG PO TABS
20.0000 mg | ORAL_TABLET | Freq: Every day | ORAL | 1 refills | Status: AC
  Filled 2021-12-23: qty 90, 90d supply, fill #0

## 2021-12-23 NOTE — Assessment & Plan Note (Signed)
Blood pressure elevated today to 143/94. No red flag signs or symptoms including headaches, changes in vision. Refill losartan-HCTZ today.  Discussed she can take this in the morning to avoid frequent nocturnal urination. Follow-up in 1 month to see if she has had improvement in her blood pressure.

## 2021-12-23 NOTE — Patient Instructions (Signed)
Great seeing you today!  We collected some testing- I will call you if it is abnormal. If not, I will send a Mychart message.  Please pick up your losartan-HCTZ and start taking this for your blood pressure.  I sent in Tramadol for pain- try to take this only as needed.  I will see you back in 1-2 months.  Dr. Melissa Noon

## 2021-12-23 NOTE — Progress Notes (Signed)
    SUBJECTIVE:   CHIEF COMPLAINT / HPI:   Lisa Cooley is a 36 year old female here with desire for STI screening and to discuss sciatica pain.  STI screening Has been having vaginal irritation and itching for few days.  Has had a yeast infection in the past, but does not feel like that. Denies any odor.  Sexually active. Desires full STI screening.  Hypertension She is out of BP medicine- ran out September/October. Says that it was very expensive, she was unable to afford it.  Is wondering if would be cheaper at the current pharmacy versus [facility]. Denies any headaches, chest pain.  Sciatica pain Has history of L4-L5 nerve impingement, seen on MRI. Used to take tramadol 50 mg every 6 hours for pain. Has not had this filled since August, per PDMP review. Says that her pain is flaring up very bad recently.  Also takes gabapentin 900 mg twice daily.   PERTINENT  PMH / PSH: Reviewed  OBJECTIVE:   BP (!) 143/94   Pulse 94   Ht 5\' 7"  (1.702 m)   Wt (!) 368 lb (166.9 kg)   LMP 12/03/2021   SpO2 97%   BMI 57.64 kg/m   General: Well-appearing, no distress CV: Regular rate and rhythm Respiratory: Normal work of breathing on room air GU: CMA Lisa Cooley present as chaperone.  External vulva and vagina nonerythematous, without any obvious lesions or rash.  Moderate yellowish tinted discharge appreciated.  Normal ruggae of vaginal walls.  Cervix is non erythematous and non-friable.     ASSESSMENT/PLAN:   Hypertension associated with diabetes (HCC) Blood pressure elevated today to 143/94. No red flag signs or symptoms including headaches, changes in vision. Refill losartan-HCTZ today.  Discussed she can take this in the morning to avoid frequent nocturnal urination. Follow-up in 1 month to see if she has had improvement in her blood pressure.  DM2 (diabetes mellitus, type 2) (HCC) Currently only taking glipizide and metformin. Will discuss glipizide at next visit, mom a and try to find  a medication with less hypoglycemic adverse effect.  Routine screening for STI (sexually transmitted infection) Moderate discharge on exam. Obtained wet prep, GC/chlamydia, HIV, RPR. Will follow-up results and treat as appropriate.  Radicular syndrome of lower limbs Refilled tramadol #60 tablets, to be taken twice daily as needed. PDMP reviewed,, refill as appropriate. Continue ibuprofen and gabapentin. Will follow-up to discuss weight reduction, which we talked about today.     13/02/2021, DO [facility]

## 2021-12-23 NOTE — Assessment & Plan Note (Signed)
Currently only taking glipizide and metformin. Will discuss glipizide at next visit, mom a and try to find a medication with less hypoglycemic adverse effect.

## 2021-12-23 NOTE — Assessment & Plan Note (Signed)
Refilled tramadol #60 tablets, to be taken twice daily as needed. PDMP reviewed,, refill as appropriate. Continue ibuprofen and gabapentin. Will follow-up to discuss weight reduction, which we talked about today.

## 2021-12-23 NOTE — Assessment & Plan Note (Signed)
Moderate discharge on exam. Obtained wet prep, GC/chlamydia, HIV, RPR. Will follow-up results and treat as appropriate.

## 2021-12-24 LAB — HIV ANTIBODY (ROUTINE TESTING W REFLEX): HIV Screen 4th Generation wRfx: NONREACTIVE

## 2021-12-24 LAB — RPR: RPR Ser Ql: NONREACTIVE

## 2021-12-25 ENCOUNTER — Encounter: Payer: Self-pay | Admitting: Student

## 2021-12-25 LAB — CERVICOVAGINAL ANCILLARY ONLY
Bacterial Vaginitis (gardnerella): NEGATIVE
Chlamydia: NEGATIVE
Comment: NEGATIVE
Comment: NEGATIVE
Comment: NEGATIVE
Comment: NORMAL
Neisseria Gonorrhea: NEGATIVE
Trichomonas: POSITIVE — AB

## 2021-12-29 ENCOUNTER — Other Ambulatory Visit (HOSPITAL_COMMUNITY): Payer: Self-pay

## 2021-12-29 MED ORDER — METRONIDAZOLE 500 MG PO TABS
500.0000 mg | ORAL_TABLET | Freq: Two times a day (BID) | ORAL | 0 refills | Status: AC
  Filled 2021-12-29: qty 14, 7d supply, fill #0

## 2021-12-29 MED ORDER — METRONIDAZOLE 500 MG PO TABS: 500.0000 mg | ORAL_TABLET | Freq: Three times a day (TID) | ORAL | 0 refills | Status: DC

## 2021-12-29 NOTE — Addendum Note (Signed)
Addended by: Darral Dash on: 12/29/2021 11:24 AM   Modules accepted: Orders

## 2022-01-07 ENCOUNTER — Other Ambulatory Visit (HOSPITAL_COMMUNITY): Payer: Self-pay

## 2022-02-17 ENCOUNTER — Other Ambulatory Visit: Payer: Self-pay | Admitting: Family Medicine

## 2022-02-17 ENCOUNTER — Other Ambulatory Visit: Payer: Self-pay | Admitting: Student

## 2022-02-18 MED ORDER — IBUPROFEN 600 MG PO TABS: 600.0000 mg | ORAL_TABLET | Freq: Three times a day (TID) | ORAL | 0 refills | Status: DC | PRN

## 2022-02-18 MED ORDER — METHOCARBAMOL 500 MG PO TABS: 500.0000 mg | ORAL_TABLET | Freq: Three times a day (TID) | ORAL | 0 refills | Status: DC | PRN

## 2022-03-08 ENCOUNTER — Encounter: Payer: Self-pay | Admitting: Student

## 2022-03-13 ENCOUNTER — Other Ambulatory Visit (HOSPITAL_COMMUNITY): Payer: Self-pay

## 2022-03-13 MED ORDER — TRAMADOL HCL 50 MG PO TABS
50.0000 mg | ORAL_TABLET | Freq: Two times a day (BID) | ORAL | 0 refills | Status: DC | PRN
  Filled 2022-03-13: qty 60, 30d supply, fill #0

## 2022-03-13 MED ORDER — TRAMADOL HCL 50 MG PO TABS: 50.0000 mg | ORAL_TABLET | Freq: Two times a day (BID) | ORAL | 0 refills | Status: AC | PRN

## 2022-03-13 NOTE — Telephone Encounter (Signed)
Called and canceled at [place] [facility]. Please send to [facility].   Medication pended.   Talbot Grumbling, RN

## 2022-03-13 NOTE — Addendum Note (Signed)
Addended by: Talbot Grumbling on: 03/13/2022 12:10 PM   Modules accepted: Orders

## 2022-03-13 NOTE — Addendum Note (Signed)
Addended by: Orvis Brill on: 03/13/2022 04:23 PM   Modules accepted: Orders

## 2022-03-17 IMAGING — DX DG LUMBAR SPINE COMPLETE 4+V
4 series · 4 of 4 positions shown · non-contrast
Comparison: None.

CLINICAL DATA: Chronic back pain

EXAM:
LUMBAR SPINE - COMPLETE 4+ VIEW

[t lumbar spine ap]
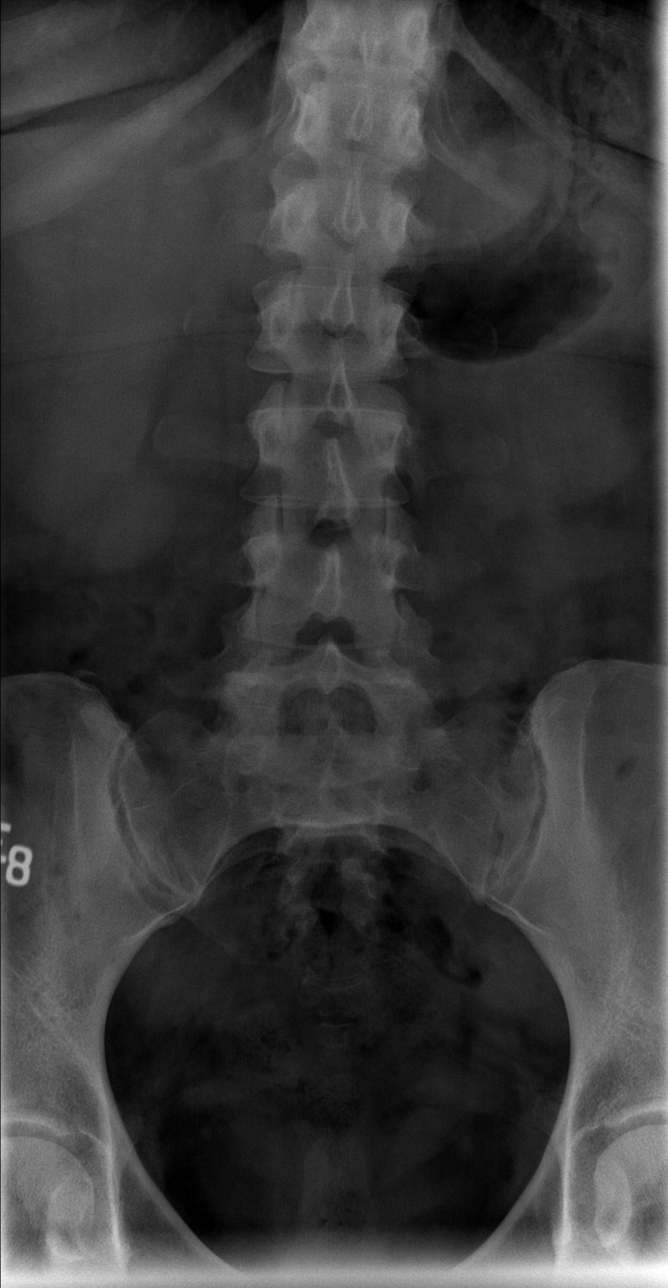

[t lumbar spine obl (1 of 2)]
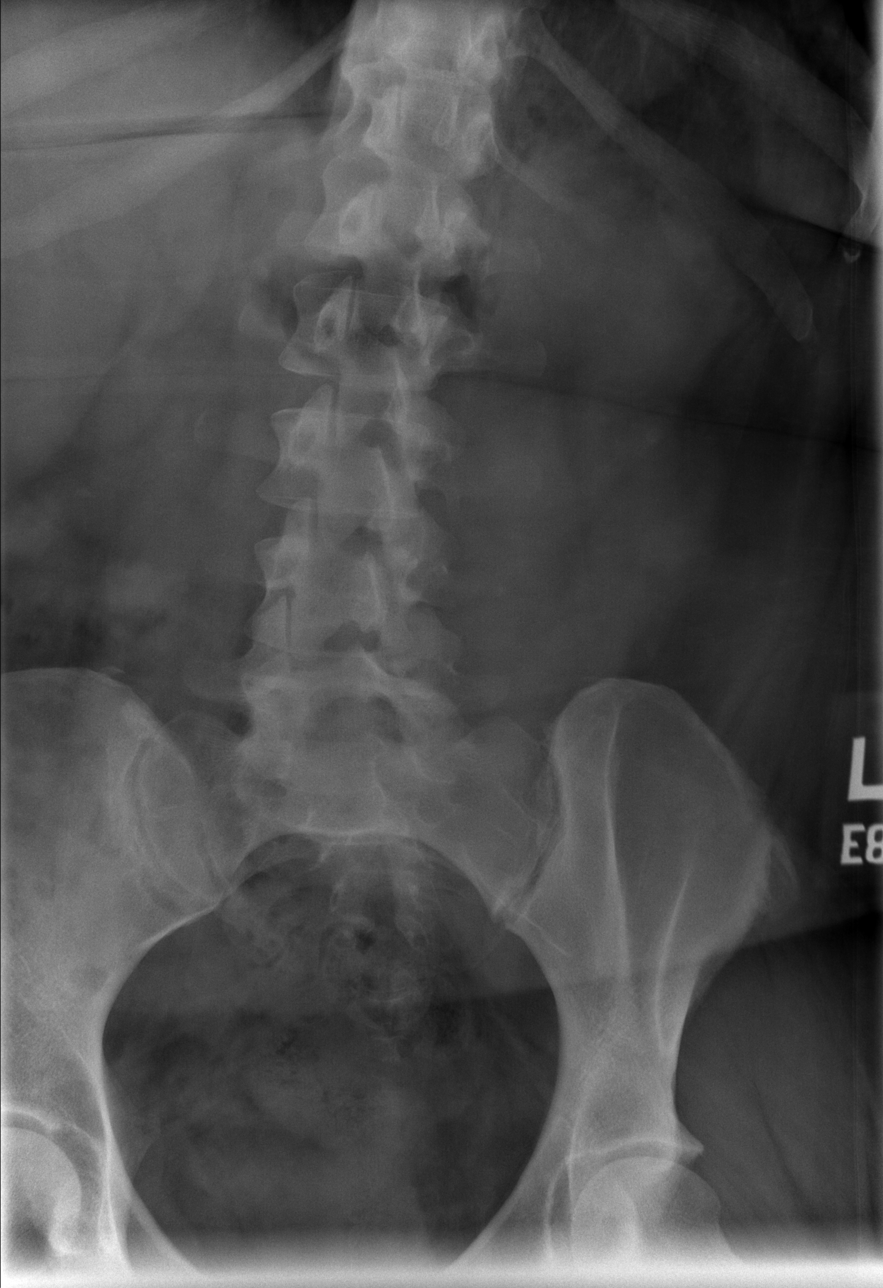

[t lumbar spine obl (2 of 2)]
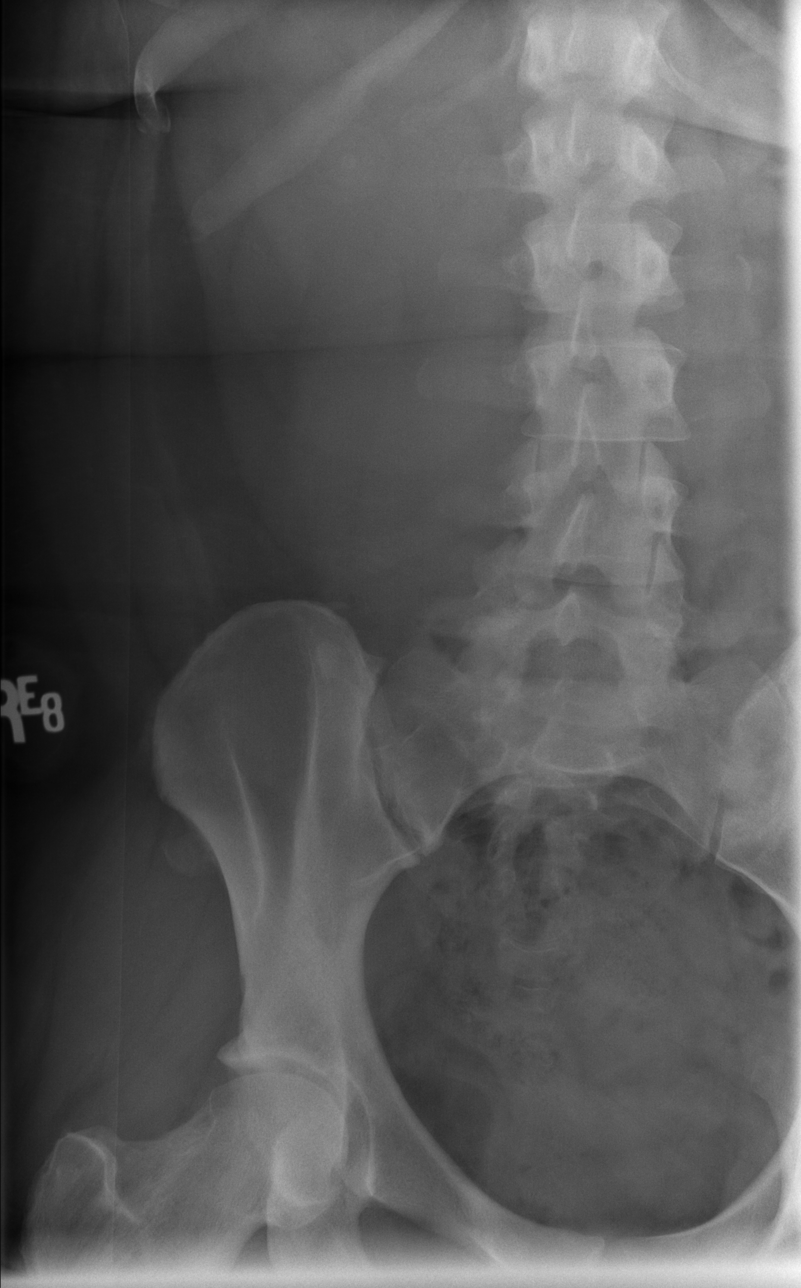

[t lumbar spine lat]
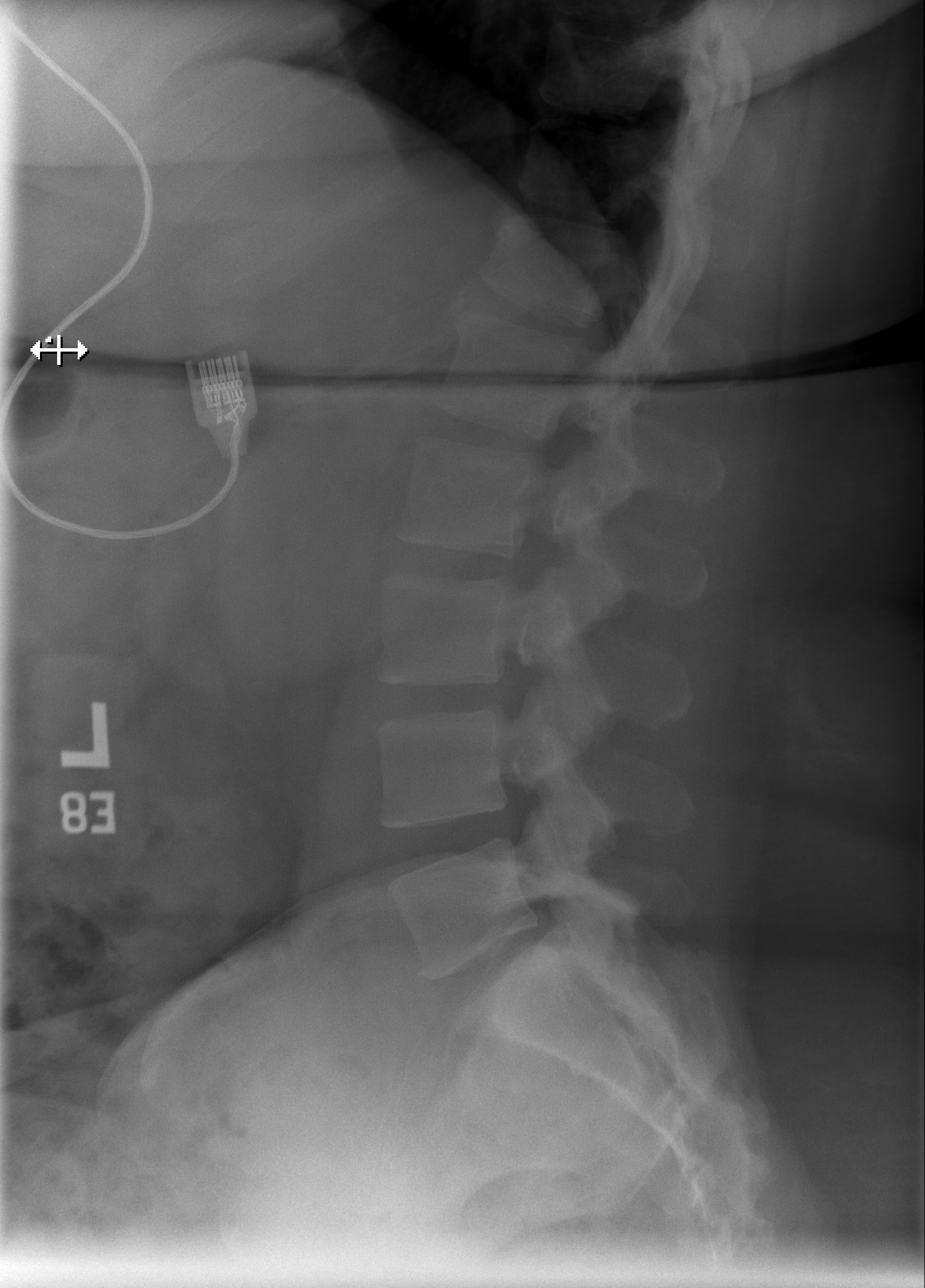

[4 of 4 positions shown; findings below may reference images not displayed]

FINDINGS: 5 nonrib bearing lumbar-type vertebral bodies.

Vertebral body heights are maintained. No acute fracture.

No static listhesis. No spondylolysis.

Degenerative disease with disc height loss at L5-S1.

SI joints are unremarkable.
IMPRESSION: No acute osseous injury of the lumbar spine.

## 2022-04-28 ENCOUNTER — Encounter: Payer: Commercial Managed Care - HMO | Admitting: Pharmacist

## 2022-04-30 ENCOUNTER — Telehealth: Payer: Self-pay

## 2022-04-30 NOTE — Telephone Encounter (Signed)
Attempted to contact patient regarding missed appointment for LIBERATE study. Patient states she is not living in [place] at the moment and does not have transportation to visits. Provided Dr. Graylin Shiver office number to patient and encouraged her to call if her living situation changes.   Joseph Art, Pharm.D. PGY-2 Ambulatory Care Pharmacy Resident

## 2022-05-08 ENCOUNTER — Ambulatory Visit
Admission: EM | Admit: 2022-05-08 | Discharge: 2022-05-08 | Disposition: A | Discharge status: Home / Self Care | Payer: Medicaid Other | Attending: Internal Medicine | Admitting: Internal Medicine

## 2022-05-08 DIAGNOSIS — E119 Type 2 diabetes mellitus without complications: Secondary | ICD-10-CM | POA: Insufficient documentation

## 2022-05-08 DIAGNOSIS — R0981 Nasal congestion: Secondary | ICD-10-CM | POA: Diagnosis present

## 2022-05-08 DIAGNOSIS — J0100 Acute maxillary sinusitis, unspecified: Secondary | ICD-10-CM | POA: Diagnosis not present

## 2022-05-08 DIAGNOSIS — Z79899 Other long term (current) drug therapy: Secondary | ICD-10-CM | POA: Diagnosis not present

## 2022-05-08 DIAGNOSIS — Z7984 Long term (current) use of oral hypoglycemic drugs: Secondary | ICD-10-CM | POA: Insufficient documentation

## 2022-05-08 DIAGNOSIS — M5430 Sciatica, unspecified side: Secondary | ICD-10-CM

## 2022-05-08 DIAGNOSIS — I10 Essential (primary) hypertension: Secondary | ICD-10-CM

## 2022-05-08 DIAGNOSIS — Z87891 Personal history of nicotine dependence: Secondary | ICD-10-CM | POA: Insufficient documentation

## 2022-05-08 DIAGNOSIS — Z1152 Encounter for screening for COVID-19: Secondary | ICD-10-CM | POA: Insufficient documentation

## 2022-05-08 DIAGNOSIS — Z7985 Long-term (current) use of injectable non-insulin antidiabetic drugs: Secondary | ICD-10-CM | POA: Insufficient documentation

## 2022-05-08 LAB — POCT INFLUENZA A/B
Influenza A, POC: NEGATIVE
Influenza B, POC: NEGATIVE

## 2022-05-08 MED ORDER — AMOXICILLIN-POT CLAVULANATE 875-125 MG PO TABS: 1.0000 | ORAL_TABLET | Freq: Two times a day (BID) | ORAL | 0 refills | Status: AC

## 2022-05-08 NOTE — Discharge Instructions (Addendum)
Your flu test was negative and your COVID test has been sent to the lab. Someone from our office will call you with your results. A prescription was sent for Augmentin. This is an antibiotic used to treat upper respiratory symptoms. Take it as directed.   Return in 2 to 3 days if no improvement. It is very important for you to pay attention to any new symptoms or worsening of your current condition. Please go directly to the Emergency Department immediately should you begin to have any of the following symptoms: shortness of breath, chest pain or difficulty breathing.

## 2022-05-08 NOTE — ED Provider Notes (Signed)
[facility]  Note:  This document was prepared using Dragon voice recognition software and may include unintentional dictation errors.  MRN: [id number] DOB: [birth year] DATE: 05/08/22   Subjective:  Chief Complaint:  Chief Complaint  Patient presents with   Facial Pain     HPI: Lisa Cooley is a 37 y.o. female presenting for nasal congestion and rhinorrhea for the past week.  Reports sick contact.  She has tried over-the-counter allergy medicine with no improvement.  She states she has had a productive cough as well.  She has noticed a change in her taste and smell.  Patient is diabetic and states her glucose has been elevated around the 200s. Denies fever, nausea/vomiting, sore throat, otalgia. Endorses congestion, rhinorrhea, body aches. Presents NAD.  Prior to Admission medications   Medication Sig Start Date End Date Taking? Authorizing Provider  amoxicillin-clavulanate (AUGMENTIN) 875-125 MG tablet Take 1 tablet by mouth every 12 (twelve) hours for 10 days. 05/08/22 05/18/22 Yes Aadan Chenier P, PA-C  blood glucose meter kit and supplies KIT Dispense based on patient and insurance preference. Use up to four times daily as directed. (FOR ICD-9 250.00, 250.01). 09/01/19   Dana AllanWalsh, Tanya, MD  Blood Glucose Monitoring Suppl (ONETOUCH VERIO) w/Device KIT Please use to check blood sugar up to once daily. E11.9 09/10/21   Alfredo MartinezMaxwell, Allee, MD  clindamycin (CLEOCIN T) 1 % lotion Apply topically 2 (two) times daily. Apply to underarms in affected areas. 09/12/21   Fayette PhoLynn, Catherine, MD  diclofenac Sodium (VOLTAREN) 1 % GEL Apply 4 g topically 4 (four) times daily. 05/05/21   Dana AllanWalsh, Tanya, MD  DULoxetine (CYMBALTA) 60 MG capsule Take 1 capsule (60 mg total) by mouth daily. 07/29/21   Dana AllanWalsh, Tanya, MD  gabapentin (NEURONTIN) 300 MG capsule Take 3 capsules (900 mg total) by mouth 2 (two) times daily. 01/15/21   Littie DeedsSun, Richard, MD  glipiZIDE (GLUCOTROL) 5 MG tablet Take 1 tablet (5 mg total) by  mouth daily before breakfast. 10/25/20   Dana AllanWalsh, Tanya, MD  glucose blood (ONETOUCH VERIO) test strip Please use to check blood sugar up to once daily. E11.9 09/10/21   Alfredo MartinezMaxwell, Allee, MD  glucose blood test strip Use as instructed 09/19/19   Moses MannersHensel, William A, MD  ibuprofen (ADVIL) 600 MG tablet Take 1 tablet (600 mg total) by mouth every 8 (eight) hours as needed. 02/18/22   Dameron, Nolberto HanlonMarisa, DO  Lancet Devices [facility](ONETOUCH DELICA PLUS LANCING) MISC Please use to check blood sugar up to once daily. E11.9 09/10/21   Alfredo MartinezMaxwell, Allee, MD  Lancet Devices MISC 1 Container by Does not apply route 2 (two) times daily as needed. As directed. 09/19/19   Moses MannersHensel, William A, MD  Lancets [facility](ONETOUCH DELICA PLUS [place] Bottom[place]ANCET33G) MISC Please use to check blood sugar up to once daily. E11.9 09/10/21   Alfredo MartinezMaxwell, Allee, MD  losartan-hydrochlorothiazide (HYZAAR) 50-12.5 MG tablet Take 1 tablet by mouth daily. 12/23/21   Dameron, Nolberto HanlonMarisa, DO  metFORMIN (GLUCOPHAGE-XR) 500 MG 24 hr tablet Take 1 tablet (500 mg total) by mouth daily with breakfast. 08/04/21   Dameron, Nolberto HanlonMarisa, DO  methocarbamol (ROBAXIN) 500 MG tablet Take 1 tablet (500 mg total) by mouth every 8 (eight) hours as needed for muscle spasms. 02/18/22   Dameron, Nolberto HanlonMarisa, DO  rosuvastatin (CRESTOR) 20 MG tablet Take 1 tablet (20 mg total) by mouth daily. 12/23/21   Dameron, Nolberto HanlonMarisa, DO  Semaglutide,0.25 or 0.5MG /DOS, 2 MG/3ML SOPN Inject 0.25 mg into the skin once a week. 08/01/21   Dana AllanWalsh, Tanya,  MD  traMADol (ULTRAM) 50 MG tablet Take 1 tablet (50 mg total) by mouth every 12 (twelve) hours as needed. 03/13/22   Dameron, Nolberto HanlonMarisa, DO  albuterol (VENTOLIN HFA) 108 (90 Base) MCG/ACT inhaler Inhale 2 puffs into the lungs every 6 (six) hours as needed for wheezing or shortness of breath. 08/30/19 09/13/19  Dana AllanWalsh, Tanya, MD     No Known Allergies  History:   Past Medical History:  Diagnosis Date   Diabetes mellitus    Dx in [place] but not on meds   Headache(784.0)    Healthcare maintenance  08/18/2018   Hypertension    Lumbar herniated disc    Sciatica    Screening for malignant neoplasm of cervix 12/20/2017     Past Surgical History:  Procedure Laterality Date   NO PAST SURGERIES      Family History  Problem Relation Age of Onset   Cancer Father    Heart disease Sister    Other Neg Hx     Social History   Tobacco Use   Smoking status: Former    Packs/day: .25    Types: Cigarettes    Quit date: 04/20/2014    Years since quitting: 8.0   Smokeless tobacco: Never  Vaping Use   Vaping Use: Some days  Substance Use Topics   Alcohol use: No    Alcohol/week: 0.0 standard drinks of alcohol   Drug use: Yes    Types: Marijuana    Review of Systems  Constitutional:  Positive for fatigue. Negative for fever.  HENT:  Positive for congestion and rhinorrhea. Negative for ear pain, sinus pressure, sinus pain and sore throat.   Respiratory:  Positive for cough.   Gastrointestinal:  Negative for abdominal pain, nausea and vomiting.  Musculoskeletal:  Positive for back pain and myalgias.     Objective:   Vitals: BP (!) 180/84 (BP Location: Right Arm) Comment: pt reports she has not taken her BP medication today  Pulse 88   Temp 98.1 F (36.7 C) (Oral)   Resp 20   LMP 05/04/2022 (Exact Date)   SpO2 96%   Physical Exam Constitutional:      General: She is not in acute distress.    Appearance: Normal appearance. She is well-developed. She is morbidly obese. She is not ill-appearing or toxic-appearing.  HENT:     Head: Normocephalic and atraumatic.     Right Ear: Ear canal normal. A middle ear effusion is present.     Left Ear: Ear canal normal. A middle ear effusion is present.     Nose:     Right Turbinates: Enlarged.     Left Turbinates: Enlarged.     Mouth/Throat:     Pharynx: Oropharynx is clear. Uvula midline. No pharyngeal swelling, oropharyngeal exudate or posterior oropharyngeal erythema.     Tonsils: No tonsillar exudate or tonsillar abscesses.   Eyes:     Pupils: Pupils are equal, round, and reactive to light.  Cardiovascular:     Rate and Rhythm: Normal rate and regular rhythm.     Heart sounds: Normal heart sounds.  Pulmonary:     Effort: Pulmonary effort is normal.     Breath sounds: Normal breath sounds.     Comments: Clear to auscultation bilaterally Abdominal:     General: Bowel sounds are normal.     Palpations: Abdomen is soft.     Tenderness: There is no abdominal tenderness.  Skin:    General: Skin is warm and dry.  Neurological:  General: No focal deficit present.     Mental Status: She is alert.  Psychiatric:        Mood and Affect: Mood and affect normal.     Results:  Labs: Results for orders placed or performed during the hospital encounter of 05/08/22 (from the past 24 hour(s))  POCT Influenza A/B     Status: None   Collection Time: 05/08/22  9:42 AM  Result Value Ref Range   Influenza A, POC Negative Negative   Influenza B, POC Negative Negative    Radiology: No results found.   UC Course/Treatments:  Procedures: Procedures   Medications Ordered in UC: Medications - No data to display   Assessment and Plan :     ICD-10-CM   1. Acute non-recurrent maxillary sinusitis  J01.00 POCT Influenza A/B    SARS CORONAVIRUS 2 (TAT 6-24 HRS) Anterior Nasal Swab    POCT Influenza A/B    SARS CORONAVIRUS 2 (TAT 6-24 HRS) Anterior Nasal Swab    2. Essential hypertension  I10     3. Sciatica, unspecified laterality  M54.30      Acute nonrecurrent maxillary sinusitis: Afebrile, nontoxic-appearing, NAD. VSS. DDX includes but not limited to: COVID, flu, pneumonia, bronchitis Flu was negative today in office.  COVID is pending. Augmentin was prescribed to empirically treat bacteria sinusitis given patient's diabetic status and ongoing symptoms with no improvement. Strict ED precautions were given and patient verbalized understanding.  Essential hypertension: Active Asymptomatic Patient states  she did not take her blood pressure medicine today.  Patient is requesting steroid shot for her sciatic pain; however, recommend avoiding steroids at this time given elevated BP as well as elevated glucose. Patient was instructed to follow-up with her PCP for management.  Continue with losartan/HCTZ 50mg /12.5mg  daily. Strict ED precautions were given and patient verbalized understanding.  Sciatic, unexpected unspecified laterality: Active Patient is requesting steroid shot for her sciatic pain; however, recommend avoiding steroids at this time given elevated BP as well as elevated glucose. Continue with gabapentin 900 mg twice daily.  Follow-up with PCP for management. Strict ED precautions were given and patient verbalized understanding.   ED Discharge Orders          Ordered    amoxicillin-clavulanate (AUGMENTIN) 875-125 MG tablet  Every 12 hours        05/08/22 0944             I have reviewed the PDMP during this encounter.      Waylon Hershey P, PA-C 05/08/22 0945

## 2022-05-08 NOTE — ED Triage Notes (Addendum)
Pt c/o of loss of smell, taste, sinus congestion, congestion in chest, body aches, chest congestion, productive cough for a few days. Requesting Steroid injection for sciatica.  Home remedies: Zyrtec, nasal spray, another allergy medication.

## 2022-05-09 LAB — SARS CORONAVIRUS 2 (TAT 6-24 HRS): SARS Coronavirus 2: NEGATIVE

## 2022-07-07 ENCOUNTER — Other Ambulatory Visit: Payer: Self-pay

## 2022-07-07 ENCOUNTER — Ambulatory Visit
Admission: EM | Admit: 2022-07-07 | Discharge: 2022-07-07 | Disposition: A | Discharge status: Home / Self Care | Payer: 59 | Attending: Emergency Medicine | Admitting: Emergency Medicine

## 2022-07-07 ENCOUNTER — Encounter: Payer: Self-pay | Admitting: Emergency Medicine

## 2022-07-07 DIAGNOSIS — I219 Acute myocardial infarction, unspecified: Secondary | ICD-10-CM

## 2022-07-07 DIAGNOSIS — I259 Chronic ischemic heart disease, unspecified: Secondary | ICD-10-CM | POA: Diagnosis present

## 2022-07-07 MED ORDER — ASPIRIN 81 MG PO CHEW
324.0000 mg | CHEWABLE_TABLET | Freq: Once | ORAL | Status: AC
  Administered 2022-07-07: 324 mg via ORAL

## 2022-07-07 NOTE — ED Provider Notes (Signed)
[name]    CSN: [id number] Arrival date & time: 07/07/22  4098    HISTORY   Chief Complaint  Patient presents with   Chest Pain   HPI Lisa Cooley is a pleasant, 37 y.o. female who presents to urgent care today. Patient presents to urgent care complaining of a 3-day history of intermittent episodes of 9 out of 10 chest pain accompanied by shortness of breath.  Patient states she is not having active chest pain at this moment but did so prior to arrival.  Patient has a blood pressure of 164/91 and is morbidly obese.  Patient also has a history of type 2 diabetes currently being treated with metformin, glipizide, semaglutide.  Patient has also been prescribed Hyzaar for her blood pressure and rosuvastatin 20 mg daily.  Patient is accompanied by a significant other today.  EKG performed on arrival revealed possible inferior and anterior lateral ischemia with elevated T waves in V2 with reciprocal T wave depression in leads III and aVF.  There is also T wave depression in leads V3 through V6.  The history is provided by the patient.   Past Medical History:  Diagnosis Date   Diabetes mellitus    Dx in [place] but not on meds   Headache(784.0)    Healthcare maintenance 08/18/2018   Hypertension    Lumbar herniated disc    Sciatica    Screening for malignant neoplasm of cervix 12/20/2017   Patient Active Problem List   Diagnosis Date Noted   Myocardial ischemia 07/07/2022   Routine screening for STI (sexually transmitted infection) 12/23/2021   Hidradenitis suppurativa 09/25/2021   Skin lesion 09/11/2021   Severe dysmenorrhea 04/10/2021   Left wrist pain 04/10/2021   Protrusion of lumbar intervertebral disc 01/30/2021   Hypertension associated with diabetes (HCC) 08/18/2018   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 Authorizing Provider  blood glucose meter kit and supplies KIT Dispense based on patient and insurance preference. Use up to four times daily as directed. (FOR ICD-9 250.00, 250.01). 09/01/19   Dana Allan, MD  Blood Glucose Monitoring Suppl (ONETOUCH VERIO) w/Device KIT Please use to check blood sugar up to once daily. E11.9 09/10/21   Alfredo Martinez, MD  clindamycin (CLEOCIN T) 1 % lotion Apply topically 2 (two) times daily. Apply to underarms in affected areas. 09/12/21   Fayette Pho, MD  diclofenac Sodium (VOLTAREN) 1 % GEL Apply 4 g topically 4 (four) times daily. 05/05/21   Dana Allan, MD  DULoxetine (CYMBALTA) 60 MG capsule Take 1 capsule (60 mg total) by mouth daily. 07/29/21   Dana Allan, MD  gabapentin (NEURONTIN) 300 MG capsule Take 3 capsules (900 mg total) by  mouth 2 (two) times daily. 01/15/21   [name], MD  glipiZIDE (GLUCOTROL) 5 MG tablet Take 1 tablet (5 mg total) by mouth daily before breakfast. 10/25/20   Dana Allan, MD  glucose blood (ONETOUCH VERIO) test strip Please use to check blood sugar up to once daily. E11.9 09/10/21   Alfredo Martinez, MD  glucose blood test strip Use as instructed 09/19/19   Moses Manners, MD  ibuprofen (ADVIL) 600 MG tablet Take 1 tablet (600 mg total) by mouth every 8 (eight) hours as needed. 02/18/22   Dameron, Nolberto Hanlon, DO  Lancet Devices [facility] DELICA PLUS LANCING) MISC Please use to check blood sugar up to once daily. E11.9 09/10/21   Alfredo Martinez, MD  Lancet Devices MISC 1 Container by Does not apply route 2 (two) times daily as needed. As directed. 09/19/19   Moses Manners, MD  Lancets [facility] DELICA PLUS [place]) MISC Please use to check blood sugar up to once daily. E11.9 09/10/21   Alfredo Martinez, MD  losartan-hydrochlorothiazide (HYZAAR) 50-12.5 MG tablet Take 1 tablet by mouth daily. 12/23/21   Dameron, Nolberto Hanlon, DO  metFORMIN (GLUCOPHAGE-XR) 500 MG 24 hr tablet Take 1 tablet (500 mg total) by mouth daily with breakfast. 08/04/21   Dameron, Nolberto Hanlon, DO  methocarbamol (ROBAXIN) 500 MG tablet Take 1 tablet (500 mg total) by mouth every 8 (eight) hours as needed for muscle spasms. 02/18/22   Dameron, Nolberto Hanlon, DO  rosuvastatin (CRESTOR) 20 MG tablet Take 1 tablet (20 mg total) by mouth daily. 12/23/21   Dameron, Nolberto Hanlon, DO  Semaglutide,0.25 or 0.5MG /DOS, 2 MG/3ML SOPN Inject 0.25 mg into the skin once a week. 08/01/21   Dana Allan, MD  traMADol (ULTRAM) 50 MG tablet Take 1 tablet (50 mg total) by mouth every 12 (twelve) hours as needed. 03/13/22   Dameron, Nolberto Hanlon, DO  albuterol (VENTOLIN HFA) 108 (90 Base) MCG/ACT inhaler Inhale 2 puffs into the lungs every 6 (six) hours as needed for wheezing or shortness of breath. 08/30/19 09/13/19  Dana Allan, MD    Family History Family History  Problem Relation Age of Onset   Cancer Father    Heart disease Sister    Other Neg Hx    Social History Social History   Tobacco Use   Smoking status: Former    Packs/day: .25    Types: Cigarettes    Quit date: 04/20/2014    Years since quitting: 8.2   Smokeless tobacco: Never  Vaping Use   Vaping Use: Some days  Substance Use Topics   Alcohol use: No    Alcohol/week: 0.0 standard drinks of alcohol   Drug use: Yes    Types: Marijuana   Allergies   Patient has no known allergies.  Review of Systems Review of Systems Pertinent findings revealed after performing a 14 point review of systems has been noted in the history of present illness.  Physical Exam Vital Signs BP (!) 164/91 (BP Location: Right Arm)   Pulse 88   Temp 99.3 F (37.4 C) (Oral)   Resp 14   LMP  07/04/2022   SpO2 95%   No data found.  Physical Exam Vitals and nursing note reviewed.  Constitutional:      General: She is awake. She is in acute distress.     Appearance: Normal appearance. She is well-developed and well-groomed. She is morbidly obese. She is diaphoretic.  HENT:     Head: Normocephalic and atraumatic.  Eyes:     Pupils: Pupils are  equal, round, and reactive to light.  Cardiovascular:     Rate and Rhythm: Normal rate and regular rhythm.  Pulmonary:     Effort: Pulmonary effort is normal.     Breath sounds: Normal breath sounds and air entry.  Musculoskeletal:        General: Normal range of motion.     Cervical back: Normal range of motion and neck supple.  Skin:    General: Skin is warm.  Neurological:     General: No focal deficit present.     Mental Status: She is alert and oriented to person, place, and time. Mental status is at baseline.  Psychiatric:        Mood and Affect: Mood normal.        Behavior: Behavior normal. Behavior is cooperative.        Thought Content: Thought content normal.        Judgment: Judgment normal.     Visual Acuity Right Eye Distance:   Left Eye Distance:   Bilateral Distance:    Right Eye Near:   Left Eye Near:    Bilateral Near:     UC Couse / Diagnostics / Procedures:     Radiology No results found.  Procedures Procedures (including critical care time) EKG  Pending results:  Labs Reviewed - No data to display  Medications Ordered in UC: Medications  aspirin chewable tablet 324 mg (324 mg Oral Given 07/07/22 1047)    UC Diagnoses / Final Clinical Impressions(s)   I have reviewed the triage vital signs and the nursing notes.  Pertinent labs & imaging results that were available during my care of the patient were reviewed by me and considered in my medical decision making (see chart for details).    Final diagnoses:  Acute myocardial infarction, initial episode of care [facility])   Patient advised of  EKG findings.  Patient was given ASA 324 mg and an 18-gauge IV was started.  911 was contacted for transport to the emergency room for further evaluation and treatment.  Please see discharge instructions below for details of plan of care as provided to patient. ED Prescriptions   None    PDMP not reviewed this encounter.  Pending results:  Labs Reviewed - No data to display  Discharge Instructions: Discharge Instructions   None     Disposition Upon Discharge:  Condition: stable for discharge home  Patient presented with an acute illness with associated systemic symptoms and significant discomfort requiring urgent management. In my opinion, this is a condition that a prudent lay person (someone who possesses an average knowledge of health and medicine) may potentially expect to result in complications if not addressed urgently such as respiratory distress, impairment of bodily function or dysfunction of bodily organs.   Routine symptom specific, illness specific and/or disease specific instructions were discussed with the patient and/or caregiver at length.   As such, the patient has been evaluated and assessed, work-up was performed and treatment was provided in alignment with urgent care protocols and evidence based medicine.  Patient/parent/caregiver has been advised that the patient may require follow up for further testing and treatment if the symptoms continue in spite of treatment, as clinically indicated and appropriate.  Patient/parent/caregiver has been advised to return to the [facility] or PCP if no better; to PCP or the Emergency Department if new signs and symptoms develop, or if the current signs or symptoms continue to change or worsen for further workup, evaluation and treatment as clinically  indicated and appropriate  The patient will follow up with their current PCP if and as advised. If the patient does not currently have a PCP we will assist them in obtaining one.   The  patient may need specialty follow up if the symptoms continue, in spite of conservative treatment and management, for further workup, evaluation, consultation and treatment as clinically indicated and appropriate.  Patient/parent/caregiver verbalized understanding and agreement of plan as discussed.  All questions were addressed during visit.  Please see discharge instructions below for further details of plan.  This office note has been dictated using Teaching laboratory technician.  Unfortunately, this method of dictation can sometimes lead to typographical or grammatical errors.  I apologize for your inconvenience in advance if this occurs.  Please do not hesitate to reach out to me if clarification is needed.      Theadora Rama Scales, PA-C 07/07/22 1103

## 2022-10-23 ENCOUNTER — Ambulatory Visit
Admission: EM | Admit: 2022-10-23 | Discharge: 2022-10-23 | Disposition: A | Discharge status: Home / Self Care | Payer: Medicaid Other | Attending: Family Medicine | Admitting: Family Medicine

## 2022-10-23 DIAGNOSIS — R059 Cough, unspecified: Secondary | ICD-10-CM

## 2022-10-23 DIAGNOSIS — J3489 Other specified disorders of nose and nasal sinuses: Secondary | ICD-10-CM | POA: Diagnosis not present

## 2022-10-23 DIAGNOSIS — J0100 Acute maxillary sinusitis, unspecified: Secondary | ICD-10-CM

## 2022-10-23 DIAGNOSIS — J309 Allergic rhinitis, unspecified: Secondary | ICD-10-CM

## 2022-10-23 MED ORDER — PREDNISONE 20 MG PO TABS: ORAL_TABLET | ORAL | 0 refills | Status: DC

## 2022-10-23 MED ORDER — BENZONATATE 200 MG PO CAPS: 200.0000 mg | ORAL_CAPSULE | Freq: Three times a day (TID) | ORAL | 0 refills | Status: AC | PRN

## 2022-10-23 MED ORDER — FEXOFENADINE HCL 180 MG PO TABS: 180.0000 mg | ORAL_TABLET | Freq: Every day | ORAL | 0 refills | Status: DC

## 2022-10-23 MED ORDER — AMOXICILLIN-POT CLAVULANATE 875-125 MG PO TABS: 1.0000 | ORAL_TABLET | Freq: Two times a day (BID) | ORAL | 0 refills | Status: AC

## 2022-10-23 NOTE — Discharge Instructions (Addendum)
Advised patient to take medication as directed with food to completion.  Instructed patient to take prednisone and Allegra with first dose of Augmentin for the next 5 of 10 days.  Advised may take Allegra daily or as needed afterwards for concurrent postnasal drainage/drip.  Advised may take Tessalon capsules daily or as needed for cough.  Encouraged to increase daily water intake to 64 ounces per day while taking these medications.  Advised if symptoms worsen and/or unresolved please follow-up with PCP or here for further evaluation.

## 2022-10-23 NOTE — ED Triage Notes (Signed)
Pt c/o cough, sore throat, body aches, hot flashes, N/D, sinus congestion starting Sunday Denies: V Nyquil, theraflu, vicks, tea, cough drops

## 2022-10-23 NOTE — ED Provider Notes (Addendum)
[name]    CSN: [id number] Arrival date & time: 10/23/22  1145      History   Chief Complaint No chief complaint on file.   HPI Lisa Cooley is a 37 y.o. female.   HPI 37 year old female presents with PMH significant for morbid obesity, T2DM, HTN, and herniated disc of lumbar region.  Past Medical History:  Diagnosis Date   Diabetes mellitus    Dx in [place] but not on meds   Headache(784.0)    Healthcare maintenance 08/18/2018   Hypertension    Lumbar herniated disc    Sciatica    Screening for malignant neoplasm of cervix 12/20/2017    Patient Active Problem List   Diagnosis Date Noted   Myocardial ischemia 07/07/2022   Routine screening for STI (sexually transmitted infection) 12/23/2021   Hidradenitis suppurativa 09/25/2021   Skin lesion 09/11/2021   Severe dysmenorrhea 04/10/2021   Left wrist pain 04/10/2021   Protrusion of lumbar intervertebral disc 01/30/2021   Hypertension associated with diabetes (HCC) 08/18/2018   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   DM2 (diabetes mellitus, type 2) (HCC) 08/12/2011   Morbid obesity (HCC) 08/12/2011   Infertility, female 08/12/2011   Irregular periods 08/12/2011    Past Surgical History:  Procedure Laterality Date   NO PAST SURGERIES      OB History     Gravida  0   Para  0   Term  0   Preterm      AB  0   Living  0      SAB  0   IAB      Ectopic      Multiple      Live Births               Home Medications    Prior to Admission medications   Medication Sig Start Date End Date Taking? Authorizing Provider  amoxicillin-clavulanate (AUGMENTIN) 875-125 MG tablet Take 1 tablet by mouth 2 (two) times daily for 10 days. 10/23/22 11/02/22 Yes Trevor Iha, FNP  benzonatate (TESSALON) 200 MG capsule Take 1 capsule (200 mg total) by mouth 3 (three) times daily as needed for up to 10 days for cough. 10/23/22 11/02/22 Yes Trevor Iha, FNP  fexofenadine [facility]  ALLERGY) 180 MG tablet Take 1 tablet (180 mg total) by mouth daily for 15 days. 10/23/22 11/07/22 Yes Trevor Iha, FNP  predniSONE (DELTASONE) 20 MG tablet Take 3 tabs PO daily x 5 days. 10/23/22  Yes Trevor Iha, FNP  blood glucose meter kit and supplies KIT Dispense based on patient and insurance preference. Use up to four times daily as directed. (FOR ICD-9 250.00, 250.01). 09/01/19   Dana Allan, MD  Blood Glucose Monitoring Suppl (ONETOUCH VERIO) w/Device KIT Please use to check blood sugar up to once daily. E11.9 09/10/21   Alfredo Martinez, MD  clindamycin (CLEOCIN T) 1 % lotion Apply topically 2 (two) times daily. Apply to underarms in affected areas. 09/12/21   Valetta Close, MD  diclofenac Sodium (VOLTAREN) 1 % GEL Apply 4 g topically 4 (four) times daily. 05/05/21   Dana Allan, MD  DULoxetine (CYMBALTA) 60 MG capsule Take 1 capsule (60 mg total) by mouth daily. 07/29/21   Dana Allan, MD  gabapentin (NEURONTIN) 300 MG capsule Take 3 capsules (900 mg total) by mouth 2 (two) times daily. 01/15/21   [name], MD  glipiZIDE (GLUCOTROL) 5 MG tablet Take 1 tablet (5 mg total) by mouth daily before  breakfast. 10/25/20   Dana Allan, MD  glucose blood [facility] VERIO) test strip Please use to check blood sugar up to once daily. E11.9 09/10/21   Alfredo Martinez, MD  glucose blood test strip Use as instructed 09/19/19   Moses Manners, MD  ibuprofen (ADVIL) 600 MG tablet Take 1 tablet (600 mg total) by mouth every 8 (eight) hours as needed. 02/18/22   Dameron, Nolberto Hanlon, DO  Lancet Devices [facility] DELICA PLUS LANCING) MISC Please use to check blood sugar up to once daily. E11.9 09/10/21   Alfredo Martinez, MD  Lancet Devices MISC 1 Container by Does not apply route 2 (two) times daily as needed. As directed. 09/19/19   Moses Manners, MD  Lancets [facility] DELICA PLUS [place]) MISC Please use to check blood sugar up to once daily. E11.9 09/10/21   Alfredo Martinez, MD  losartan-hydrochlorothiazide  (HYZAAR) 50-12.5 MG tablet Take 1 tablet by mouth daily. 12/23/21   Dameron, Nolberto Hanlon, DO  metFORMIN (GLUCOPHAGE-XR) 500 MG 24 hr tablet Take 1 tablet (500 mg total) by mouth daily with breakfast. 08/04/21   Dameron, Nolberto Hanlon, DO  methocarbamol (ROBAXIN) 500 MG tablet Take 1 tablet (500 mg total) by mouth every 8 (eight) hours as needed for muscle spasms. 02/18/22   Dameron, Nolberto Hanlon, DO  rosuvastatin (CRESTOR) 20 MG tablet Take 1 tablet (20 mg total) by mouth daily. 12/23/21   Dameron, Nolberto Hanlon, DO  Semaglutide,0.25 or 0.5MG /DOS, 2 MG/3ML SOPN Inject 0.25 mg into the skin once a week. 08/01/21   Dana Allan, MD  traMADol (ULTRAM) 50 MG tablet Take 1 tablet (50 mg total) by mouth every 12 (twelve) hours as needed. 03/13/22   Dameron, Nolberto Hanlon, DO  albuterol (VENTOLIN HFA) 108 (90 Base) MCG/ACT inhaler Inhale 2 puffs into the lungs every 6 (six) hours as needed for wheezing or shortness of breath. 08/30/19 09/13/19  Dana Allan, MD    Family History Family History  Problem Relation Age of Onset   Cancer Father    Heart disease Sister    Other Neg Hx     Social History Social History   Tobacco Use   Smoking status: Former    Current packs/day: 0.00    Types: Cigarettes    Quit date: 04/20/2014    Years since quitting: 8.5   Smokeless tobacco: Never  Vaping Use   Vaping status: Some Days  Substance Use Topics   Alcohol use: No    Alcohol/week: 0.0 standard drinks of alcohol   Drug use: Yes    Types: Marijuana     Allergies   Patient has no known allergies.   Review of Systems Review of Systems  HENT:  Positive for congestion and sore throat.   Respiratory:  Positive for cough.   Musculoskeletal:  Positive for arthralgias and myalgias.  All other systems reviewed and are negative.    Physical Exam Triage Vital Signs ED Triage Vitals  Encounter Vitals Group     BP      Systolic BP Percentile      Diastolic BP Percentile      Pulse      Resp      Temp      Temp src      SpO2       Weight      Height      Head Circumference      Peak Flow      Pain Score      Pain Loc  Pain Education      Exclude from Growth Chart    No data found.  Updated Vital Signs BP (!) 158/100 (BP Location: Right Arm) Comment: pt states she has not taken her BP meds today  Pulse 98   Temp 99.3 F (37.4 C) (Oral)   Resp 18   LMP 09/20/2022 (Approximate)   SpO2 96%    Physical Exam Vitals and nursing note reviewed.  Constitutional:      Appearance: Normal appearance. She is obese. She is ill-appearing.  HENT:     Head: Normocephalic and atraumatic.     Right Ear: Tympanic membrane and external ear normal.     Left Ear: Tympanic membrane and external ear normal.     Ears:     Comments: Significant eustachian tube dysfunction noted bilaterally    Nose:     Right Sinus: Maxillary sinus tenderness present.     Left Sinus: Maxillary sinus tenderness present.     Comments: Turbinates are erythematous/edematous    Mouth/Throat:     Mouth: Mucous membranes are moist.     Pharynx: Oropharynx is clear.     Comments: Significant amount of clear drainage of posterior oropharynx noted Eyes:     Extraocular Movements: Extraocular movements intact.     Conjunctiva/sclera: Conjunctivae normal.     Pupils: Pupils are equal, round, and reactive to light.  Cardiovascular:     Rate and Rhythm: Normal rate and regular rhythm.     Pulses: Normal pulses.     Heart sounds: Normal heart sounds.  Pulmonary:     Effort: Pulmonary effort is normal.     Breath sounds: Normal breath sounds. No wheezing, rhonchi or rales.  Musculoskeletal:        General: Normal range of motion.     Cervical back: Normal range of motion and neck supple.  Skin:    General: Skin is warm and dry.  Neurological:     General: No focal deficit present.     Mental Status: She is alert and oriented to person, place, and time. Mental status is at baseline.  Psychiatric:        Mood and Affect: Mood normal.         Behavior: Behavior normal.        Thought Content: Thought content normal.      UC Treatments / Results  Labs (all labs ordered are listed, but only abnormal results are displayed) Labs Reviewed - No data to display  EKG   Radiology No results found.  Procedures Procedures (including critical care time)  Medications Ordered in UC Medications - No data to display  Initial Impression / Assessment and Plan / UC Course  I have reviewed the triage vital signs and the nursing notes.  Pertinent labs & imaging results that were available during my care of the patient were reviewed by me and considered in my medical decision making (see chart for details).     MDM: 1.  Acute maxillary sinusitis, recurrence not specified-Rx'd Augmentin 875/125 mg tablet: Take 1 tablet twice daily x 10 days; 2.  Sinus pressure-Rx'd Prednisone 60 mg daily x 5 days; 3.  Allergic rhinitis, unspecified seasonality, unspecified trigger-Rx'd Allegra 180 mg fexofenadine without D daily x 5 days, then as needed for concurrent allergic rhinitis; 4.  Cough, unspecified type-Rx'd Tessalon 200 mg capsules 3 times daily, as needed x 10 days. Advised patient to take medication as directed with food to completion.  Instructed patient to take prednisone  and Allegra with first dose of Augmentin for the next 5 of 10 days.  Advised may take Allegra daily or as needed afterwards for concurrent postnasal drainage/drip.  Advised may take Tessalon capsules daily or as needed for cough.  Encouraged to increase daily water intake to 64 ounces per day while taking these medications.  Advised if symptoms worsen and/or unresolved please follow-up with PCP or here for further evaluation.  Work note provided to patient prior to discharge.  Patient discharged home, hemodynamically stable.  Final Clinical Impressions(s) / UC Diagnoses   Final diagnoses:  Acute maxillary sinusitis, recurrence not specified  Sinus pressure  Allergic  rhinitis, unspecified seasonality, unspecified trigger  Cough, unspecified type     Discharge Instructions      Advised patient to take medication as directed with food to completion.  Instructed patient to take prednisone and Allegra with first dose of Augmentin for the next 5 of 10 days.  Advised may take Allegra daily or as needed afterwards for concurrent postnasal drainage/drip.  Advised may take Tessalon capsules daily or as needed for cough.  Encouraged to increase daily water intake to 64 ounces per day while taking these medications.  Advised if symptoms worsen and/or unresolved please follow-up with PCP or here for further evaluation.     ED Prescriptions     Medication Sig Dispense Auth. Provider   amoxicillin-clavulanate (AUGMENTIN) 875-125 MG tablet Take 1 tablet by mouth 2 (two) times daily for 10 days. 20 tablet Trevor Iha, FNP   predniSONE (DELTASONE) 20 MG tablet Take 3 tabs PO daily x 5 days. 15 tablet Trevor Iha, FNP   benzonatate (TESSALON) 200 MG capsule Take 1 capsule (200 mg total) by mouth 3 (three) times daily as needed for up to 10 days for cough. 40 capsule Trevor Iha, FNP   fexofenadine [facility] ALLERGY) 180 MG tablet Take 1 tablet (180 mg total) by mouth daily for 15 days. 15 tablet Trevor Iha, FNP      PDMP not reviewed this encounter.   Trevor Iha, FNP 10/23/22 1235    Trevor Iha, FNP 10/23/22 1236

## 2022-11-20 DIAGNOSIS — H52203 Unspecified astigmatism, bilateral: Secondary | ICD-10-CM | POA: Insufficient documentation

## 2022-11-20 DIAGNOSIS — H5213 Myopia, bilateral: Secondary | ICD-10-CM | POA: Insufficient documentation

## 2022-11-24 ENCOUNTER — Ambulatory Visit
Admission: EM | Admit: 2022-11-24 | Discharge: 2022-11-24 | Disposition: A | Discharge status: Home / Self Care | Payer: Medicaid Other | Attending: Emergency Medicine | Admitting: Emergency Medicine

## 2022-11-24 DIAGNOSIS — S01131A Puncture wound without foreign body of right eyelid and periocular area, initial encounter: Secondary | ICD-10-CM | POA: Diagnosis not present

## 2022-11-24 DIAGNOSIS — X503XXA Overexertion from repetitive movements, initial encounter: Secondary | ICD-10-CM | POA: Diagnosis not present

## 2022-11-24 DIAGNOSIS — S46011A Strain of muscle(s) and tendon(s) of the rotator cuff of right shoulder, initial encounter: Secondary | ICD-10-CM

## 2022-11-24 DIAGNOSIS — L089 Local infection of the skin and subcutaneous tissue, unspecified: Secondary | ICD-10-CM

## 2022-11-24 MED ORDER — IBUPROFEN 600 MG PO TABS: 600.0000 mg | ORAL_TABLET | Freq: Three times a day (TID) | ORAL | 0 refills | Status: DC | PRN

## 2022-11-24 MED ORDER — SULFAMETHOXAZOLE-TRIMETHOPRIM 800-160 MG PO TABS: 1.0000 | ORAL_TABLET | Freq: Two times a day (BID) | ORAL | 0 refills | Status: AC

## 2022-11-24 NOTE — ED Provider Notes (Signed)
[facility]    CSN: [id number] Arrival date & time: 11/24/22  1438    HISTORY   Chief Complaint  Patient presents with   Facial Swelling   Shoulder Pain   HPI Lisa Cooley is a pleasant, 37 y.o. female who presents to urgent care today. Patient complains of swelling and tenderness of the skin around the piercing in her right eyebrow.  Patient states piercing is not new but did change the piercing piece about a month ago to 1 with a smaller gauge.  Patient states she has not noticed any drainage from the area.  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  Patient denies acute injury to right shoulder, pain radiating toward neck or down right arm.  The history is provided by the patient.   Past Medical History:  Diagnosis Date   Diabetes mellitus    Dx in [place] but not on meds   Headache(784.0)    Healthcare maintenance 08/18/2018   Hypertension    Lumbar herniated disc    Sciatica    Screening for malignant neoplasm of cervix 12/20/2017   Patient Active Problem List   Diagnosis Date Noted   Myocardial ischemia 07/07/2022   Routine screening for STI (sexually transmitted infection) 12/23/2021   Hidradenitis suppurativa 09/25/2021   Skin lesion 09/11/2021   Severe dysmenorrhea 04/10/2021   Left wrist pain 04/10/2021   Protrusion of lumbar intervertebral disc 01/30/2021   Hypertension associated with diabetes (HCC) 08/18/2018   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   DM2 (diabetes mellitus, type 2) (HCC) 08/12/2011   Morbid obesity (HCC) 08/12/2011   Infertility, female 08/12/2011   Irregular periods 08/12/2011   Past Surgical History:  Procedure Laterality Date   NO PAST SURGERIES     OB History     Gravida  0   Para  0   Term  0   Preterm       AB  0   Living  0      SAB  0   IAB      Ectopic      Multiple      Live Births             Home Medications    Prior to Admission medications   Medication Sig Start Date End Date Taking? Authorizing Provider  ibuprofen (ADVIL) 600 MG tablet Take 1 tablet (600 mg total) by mouth every 8 (eight) hours as needed for up to 30 doses for fever, headache, mild pain (pain score 1-3) or moderate pain (pain score 4-6) (Inflammation). Take 1 tablet 3 times daily as needed for inflammation of upper airways and/or pain. 11/24/22  Yes Theadora Rama Scales, PA-C  sulfamethoxazole-trimethoprim (BACTRIM DS) 800-160 MG tablet Take 1 tablet by mouth 2 (two) times daily for 7 days. 11/24/22 12/01/22 Yes Theadora Rama Scales, PA-C  blood glucose meter kit and supplies KIT Dispense based on patient and insurance preference. Use up to four times daily as directed. (FOR ICD-9 250.00, 250.01). 09/01/19   Dana Allan, MD  Blood Glucose Monitoring Suppl (ONETOUCH VERIO) w/Device KIT Please use to check blood sugar up to once daily. E11.9 09/10/21   Alfredo Martinez, MD  clindamycin (CLEOCIN T) 1 % lotion Apply topically 2 (two) times  daily. Apply to underarms in affected areas. 09/12/21   Valetta Close, MD  diclofenac Sodium (VOLTAREN) 1 % GEL Apply 4 g topically 4 (four) times daily. 05/05/21   Dana Allan, MD  DULoxetine (CYMBALTA) 60 MG capsule Take 1 capsule (60 mg total) by mouth daily. 07/29/21   Dana Allan, MD  fexofenadine [facility] ALLERGY) 180 MG tablet Take 1 tablet (180 mg total) by mouth daily for 15 days. 10/23/22 11/07/22  Trevor Iha, FNP  gabapentin (NEURONTIN) 300 MG capsule Take 3 capsules (900 mg total) by mouth 2 (two) times daily. 01/15/21   [name], MD  glipiZIDE (GLUCOTROL) 5 MG tablet Take 1 tablet (5 mg total) by mouth daily before breakfast. 10/25/20   Dana Allan, MD  glucose blood (ONETOUCH VERIO) test strip Please use to check blood sugar up to once daily. E11.9  09/10/21   Alfredo Martinez, MD  glucose blood test strip Use as instructed 09/19/19   Moses Manners, MD  Lancet Devices [facility] PLUS LANCING) MISC Please use to check blood sugar up to once daily. E11.9 09/10/21   Alfredo Martinez, MD  Lancet Devices MISC 1 Container by Does not apply route 2 (two) times daily as needed. As directed. 09/19/19   Moses Manners, MD  Lancets [facility] DELICA PLUS [place]) MISC Please use to check blood sugar up to once daily. E11.9 09/10/21   Alfredo Martinez, MD  losartan-hydrochlorothiazide (HYZAAR) 50-12.5 MG tablet Take 1 tablet by mouth daily. 12/23/21   Dameron, Nolberto Hanlon, DO  metFORMIN (GLUCOPHAGE-XR) 500 MG 24 hr tablet Take 1 tablet (500 mg total) by mouth daily with breakfast. 08/04/21   Dameron, Nolberto Hanlon, DO  methocarbamol (ROBAXIN) 500 MG tablet Take 1 tablet (500 mg total) by mouth every 8 (eight) hours as needed for muscle spasms. 02/18/22   Dameron, Nolberto Hanlon, DO  predniSONE (DELTASONE) 20 MG tablet Take 3 tabs PO daily x 5 days. 10/23/22   Trevor Iha, FNP  rosuvastatin (CRESTOR) 20 MG tablet Take 1 tablet (20 mg total) by mouth daily. 12/23/21   Dameron, Nolberto Hanlon, DO  Semaglutide,0.25 or 0.5MG /DOS, 2 MG/3ML SOPN Inject 0.25 mg into the skin once a week. 08/01/21   Dana Allan, MD  traMADol (ULTRAM) 50 MG tablet Take 1 tablet (50 mg total) by mouth every 12 (twelve) hours as needed. 03/13/22   Dameron, Nolberto Hanlon, DO  albuterol (VENTOLIN HFA) 108 (90 Base) MCG/ACT inhaler Inhale 2 puffs into the lungs every 6 (six) hours as needed for wheezing or shortness of breath. 08/30/19 09/13/19  Dana Allan, MD    Family History Family History  Problem Relation Age of Onset   Cancer Father    Heart disease Sister    Other Neg Hx    Social History Social History   Tobacco Use   Smoking status: Former    Current packs/day: 0.00    Types: Cigarettes    Quit date: 04/20/2014    Years since quitting: 8.6   Smokeless tobacco: Never  Vaping Use   Vaping status: Never  Used  Substance Use Topics   Alcohol use: No    Alcohol/week: 0.0 standard drinks of alcohol   Drug use: Yes    Types: Marijuana   Allergies   Patient has no known allergies.  Review of Systems Review of Systems Pertinent findings revealed after performing a 14 point review of systems has been noted in the history of present illness.  Physical Exam Vital Signs Pulse 92   Temp 98.7 F (37.1 C) (  Oral)   Resp 18   LMP 11/17/2022 (Approximate)   SpO2 96%   No data found.  Physical Exam Vitals and nursing note reviewed.  Constitutional:      General: She is not in acute distress.    Appearance: Normal appearance.  HENT:     Head: Normocephalic and atraumatic.   Eyes:     Pupils: Pupils are equal, round, and reactive to light.  Cardiovascular:     Rate and Rhythm: Normal rate and regular rhythm.  Pulmonary:     Effort: Pulmonary effort is normal.     Breath sounds: Normal breath sounds.  Musculoskeletal:     Right shoulder: Tenderness present. No swelling, deformity, bony tenderness or crepitus. Decreased range of motion. Decreased strength.     Cervical back: Normal range of motion and neck supple.  Skin:    General: Skin is warm and dry.  Neurological:     General: No focal deficit present.     Mental Status: She is alert and oriented to person, place, and time. Mental status is at baseline.  Psychiatric:        Mood and Affect: Mood normal.        Behavior: Behavior normal.        Thought Content: Thought content normal.        Judgment: Judgment normal.     Visual Acuity Right Eye Distance:   Left Eye Distance:   Bilateral Distance:    Right Eye Near:   Left Eye Near:    Bilateral Near:     UC Couse / Diagnostics / Procedures:     Radiology No results found.  Procedures Procedures (including critical care time) EKG  Pending results:  Labs Reviewed - No data to display  Medications Ordered in UC: Medications - No data to display  UC  Diagnoses / Final Clinical Impressions(s)   I have reviewed the triage vital signs and the nursing notes.  Pertinent labs & imaging results that were available during my care of the patient were reviewed by me and considered in my medical decision making (see chart for details).    Final diagnoses:  Piercing of right eyebrow with infection, initial encounter  Strain of right rotator cuff capsule, initial encounter  Overuse injury   Patient advised to remove piercing and begin Bactrim DS 1 tablet twice daily for 7 days.  Patient provided with exercises for pain and strengthening of rotator cuff.  Patient also provided with a refill of her prescription for ibuprofen 600 mg for pain relief.  Note provided for patient to be out of work for several days to promote healing.  Please see discharge instructions below for details of plan of care as provided to patient. ED Prescriptions     Medication Sig Dispense Auth. Provider   sulfamethoxazole-trimethoprim (BACTRIM DS) 800-160 MG tablet Take 1 tablet by mouth 2 (two) times daily for 7 days. 14 tablet Theadora Rama Scales, PA-C   ibuprofen (ADVIL) 600 MG tablet Take 1 tablet (600 mg total) by mouth every 8 (eight) hours as needed for up to 30 doses for fever, headache, mild pain (pain score 1-3) or moderate pain (pain score 4-6) (Inflammation). Take 1 tablet 3 times daily as needed for inflammation of upper airways and/or pain. 30 tablet Theadora Rama Scales, PA-C      PDMP not reviewed this encounter.  Pending results:  Labs Reviewed - No data to display  Discharge Instructions:   Discharge Instructions  To treat the infection the piercing of the right eyebrow, please remove your piercing and begin Bactrim by taking 1 tablet twice daily for a full 7 days of treatment.  Once you have finished antibiotics, if the infection appears to be completely resolved, you can attempt to replace the piercing but please do not force the post into  your skin as you may cause damage to the tissue and cause another infection.  I have enclosed information regarding rotator cuff pain relief and strengthening exercises.  Hopefully you find this helpful.  I provided you with a note to be out of work for a few days so that your right shoulder can rest.    I also renewed your prescription for ibuprofen 600 mg tablets that you can take up to 3 times daily as needed.  Thank you for visiting [facility] today.  We appreciate the opportunity to participate in your care.    Disposition Upon Discharge:  Condition: stable for discharge home  Patient presented with an acute illness with associated systemic symptoms and significant discomfort requiring urgent management. In my opinion, this is a condition that a prudent lay person (someone who possesses an average knowledge of health and medicine) may potentially expect to result in complications if not addressed urgently such as respiratory distress, impairment of bodily function or dysfunction of bodily organs.   Routine symptom specific, illness specific and/or disease specific instructions were discussed with the patient and/or caregiver at length.   As such, the patient has been evaluated and assessed, work-up was performed and treatment was provided in alignment with urgent care protocols and evidence based medicine.  Patient/parent/caregiver has been advised that the patient may require follow up for further testing and treatment if the symptoms continue in spite of treatment, as clinically indicated and appropriate.  Patient/parent/caregiver has been advised to return to the [facility] or PCP if no better; to PCP or the Emergency Department if new signs and symptoms develop, or if the current signs or symptoms continue to change or worsen for further workup, evaluation and treatment as clinically indicated and appropriate  The patient will follow up with their current PCP if and as advised. If  the patient does not currently have a PCP we will assist them in obtaining one.   The patient may need specialty follow up if the symptoms continue, in spite of conservative treatment and management, for further workup, evaluation, consultation and treatment as clinically indicated and appropriate.  Patient/parent/caregiver verbalized understanding and agreement of plan as discussed.  All questions were addressed during visit.  Please see discharge instructions below for further details of plan.  This office note has been dictated using Teaching laboratory technician.  Unfortunately, this method of dictation can sometimes lead to typographical or grammatical errors.  I apologize for your inconvenience in advance if this occurs.  Please do not hesitate to reach out to me if clarification is needed.      Theadora Rama Scales, [place] 11/24/22 1537

## 2022-11-24 NOTE — ED Triage Notes (Signed)
Pt c/o pain and swelling to R eyebrow piercing. Reports some soreness on Friday; woke up this AM with swelling. Piercing is not new. Changed the piece approx a month ago to a smaller gauge.  Pt also c/o muscular pain with movement to R shoulder x3-4 days.

## 2022-11-24 NOTE — Discharge Instructions (Addendum)
To treat the infection the piercing of the right eyebrow, please remove your piercing and begin Bactrim by taking 1 tablet twice daily for a full 7 days of treatment.  Once you have finished antibiotics, if the infection appears to be completely resolved, you can attempt to replace the piercing but please do not force the post into your skin as you may cause damage to the tissue and cause another infection.  I have enclosed information regarding rotator cuff pain relief and strengthening exercises.  Hopefully you find this helpful.  I provided you with a note to be out of work for a few days so that your right shoulder can rest.    I also renewed your prescription for ibuprofen 600 mg tablets that you can take up to 3 times daily as needed.  Thank you for visiting [facility] today.  We appreciate the opportunity to participate in your care.

## 2022-12-28 DIAGNOSIS — R079 Chest pain, unspecified: Secondary | ICD-10-CM | POA: Insufficient documentation

## 2023-01-08 ENCOUNTER — Ambulatory Visit
Admission: EM | Admit: 2023-01-08 | Discharge: 2023-01-08 | Disposition: A | Discharge status: Home / Self Care | Payer: No Typology Code available for payment source | Attending: Emergency Medicine | Admitting: Emergency Medicine

## 2023-01-08 ENCOUNTER — Encounter: Payer: Self-pay | Admitting: Emergency Medicine

## 2023-01-08 DIAGNOSIS — N9089 Other specified noninflammatory disorders of vulva and perineum: Secondary | ICD-10-CM

## 2023-01-08 MED ORDER — LIDOCAINE 5 % EX OINT: 1.0000 | TOPICAL_OINTMENT | Freq: Four times a day (QID) | CUTANEOUS | 0 refills | Status: DC | PRN

## 2023-01-08 NOTE — ED Provider Notes (Signed)
[facility]    CSN: [id number] Arrival date & time: 01/08/23  1317    HISTORY  No chief complaint on file.  HPI Lisa Cooley is a pleasant, 37 y.o. female who presents to urgent care today. Patient complains of a 6-day history of vaginal itching and irritation.  Patient denies abdominal pain, new sexual partners, nausea, vomiting, diarrhea.  Patient states she had STD testing performed yesterday and results were negative.  Patient states she has been given fluconazole which not only did not improve her symptoms but believes that it may have made her symptoms worse.  Patient states she believes she might have cut herself while aggressively washing her vaginal area with a washcloth.  EMR reviewed by me.  Patient was seen by internal medicine on January 04, 2023 for acute vaginitis.  Patient was provided with a single dose of Diflucan 150 mg for empiric treatment of presumed vaginal yeast infection, testing was not performed.  Patient called back the next day to let them know that her symptoms had gotten worse, stated she had been using hydrocortisone cream in her vaginal area as well as applying a hot washcloth to the area.    On December 4, patient again was seen by internal medicine where vaginal exam was performed.  Patient was tested for gonorrhea, chlamydia, HIV, syphilis, BV and yeast.  All results were negative.  Patient was advised to continue hydrocortisone 1% cream twice daily for 1 week, to avoid scented soaps and detergents.    Today, patient states she has not been using the hydrocortisone cream as recommended and has not purchased the hypoallergenic soap that was recommended by the provider, CeraVe.    The history is provided by the patient.     Past Medical History:  Diagnosis Date   Diabetes mellitus    Dx in [place] but not on meds   Headache(784.0)    Healthcare maintenance 08/18/2018   Hypertension    Lumbar herniated disc    Sciatica    Screening  for malignant neoplasm of cervix 12/20/2017   Patient Active Problem List   Diagnosis Date Noted   Myocardial ischemia 07/07/2022   Routine screening for STI (sexually transmitted infection) 12/23/2021   Hidradenitis suppurativa 09/25/2021   Skin lesion 09/11/2021   Severe dysmenorrhea 04/10/2021   Left wrist pain 04/10/2021   Protrusion of lumbar intervertebral disc 01/30/2021   Hypertension associated with diabetes (HCC) 08/18/2018   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   DM2 (diabetes mellitus, type 2) (HCC) 08/12/2011   Morbid obesity (HCC) 08/12/2011   Infertility, female 08/12/2011   Irregular periods 08/12/2011   Past Surgical History:  Procedure Laterality Date   NO PAST SURGERIES     OB History     Gravida  0   Para  0   Term  0   Preterm      AB  0   Living  0      SAB  0   IAB      Ectopic      Multiple      Live Births             Home Medications    Prior to Admission medications   Medication Sig Start Date End Date Taking? Authorizing Provider  lidocaine (XYLOCAINE) 5 % ointment Apply 1 Application topically 4 (four) times daily as needed. 01/08/23  Yes Theadora Rama Scales, PA-C  blood glucose meter kit and supplies KIT Dispense based on  patient and insurance preference. Use up to four times daily as directed. (FOR ICD-9 250.00, 250.01). 09/01/19   Dana Allan, MD  Blood Glucose Monitoring Suppl (ONETOUCH VERIO) w/Device KIT Please use to check blood sugar up to once daily. E11.9 09/10/21   Alfredo Martinez, MD  clindamycin (CLEOCIN T) 1 % lotion Apply topically 2 (two) times daily. Apply to underarms in affected areas. 09/12/21   Valetta Close, MD  diclofenac Sodium (VOLTAREN) 1 % GEL Apply 4 g topically 4 (four) times daily. 05/05/21   Dana Allan, MD  DULoxetine (CYMBALTA) 60 MG capsule Take 1 capsule (60 mg total) by mouth daily. 07/29/21   Dana Allan, MD  fexofenadine [facility] ALLERGY) 180 MG tablet Take 1 tablet (180 mg total)  by mouth daily for 15 days. 10/23/22 11/07/22  Trevor Iha, FNP  gabapentin (NEURONTIN) 300 MG capsule Take 3 capsules (900 mg total) by mouth 2 (two) times daily. 01/15/21   [name], MD  glipiZIDE (GLUCOTROL) 5 MG tablet Take 1 tablet (5 mg total) by mouth daily before breakfast. 10/25/20   Dana Allan, MD  glucose blood (ONETOUCH VERIO) test strip Please use to check blood sugar up to once daily. E11.9 09/10/21   Alfredo Martinez, MD  glucose blood test strip Use as instructed 09/19/19   Moses Manners, MD  ibuprofen (ADVIL) 600 MG tablet Take 1 tablet (600 mg total) by mouth every 8 (eight) hours as needed for up to 30 doses for fever, headache, mild pain (pain score 1-3) or moderate pain (pain score 4-6) (Inflammation). Take 1 tablet 3 times daily as needed for inflammation of upper airways and/or pain. 11/24/22   Theadora Rama Scales, PA-C  Lancet Devices [facility] DELICA PLUS LANCING) MISC Please use to check blood sugar up to once daily. E11.9 09/10/21   Alfredo Martinez, MD  Lancet Devices MISC 1 Container by Does not apply route 2 (two) times daily as needed. As directed. 09/19/19   Moses Manners, MD  Lancets [facility] DELICA PLUS [place]) MISC Please use to check blood sugar up to once daily. E11.9 09/10/21   Alfredo Martinez, MD  losartan-hydrochlorothiazide (HYZAAR) 50-12.5 MG tablet Take 1 tablet by mouth daily. 12/23/21   Dameron, Nolberto Hanlon, DO  metFORMIN (GLUCOPHAGE-XR) 500 MG 24 hr tablet Take 1 tablet (500 mg total) by mouth daily with breakfast. 08/04/21   Dameron, Nolberto Hanlon, DO  methocarbamol (ROBAXIN) 500 MG tablet Take 1 tablet (500 mg total) by mouth every 8 (eight) hours as needed for muscle spasms. 02/18/22   Dameron, Nolberto Hanlon, DO  predniSONE (DELTASONE) 20 MG tablet Take 3 tabs PO daily x 5 days. 10/23/22   Trevor Iha, FNP  rosuvastatin (CRESTOR) 20 MG tablet Take 1 tablet (20 mg total) by mouth daily. 12/23/21   Dameron, Nolberto Hanlon, DO  Semaglutide,0.25 or 0.5MG /DOS, 2 MG/3ML SOPN  Inject 0.25 mg into the skin once a week. 08/01/21   Dana Allan, MD  traMADol (ULTRAM) 50 MG tablet Take 1 tablet (50 mg total) by mouth every 12 (twelve) hours as needed. 03/13/22   Dameron, Nolberto Hanlon, DO  albuterol (VENTOLIN HFA) 108 (90 Base) MCG/ACT inhaler Inhale 2 puffs into the lungs every 6 (six) hours as needed for wheezing or shortness of breath. 08/30/19 09/13/19  Dana Allan, MD    Family History Family History  Problem Relation Age of Onset   Cancer Father    Heart disease Sister    Other Neg Hx    Social History Social History   Tobacco Use  Smoking status: Former    Current packs/day: 0.00    Types: Cigarettes    Quit date: 04/20/2014    Years since quitting: 8.7   Smokeless tobacco: Never  Vaping Use   Vaping status: Never Used  Substance Use Topics   Alcohol use: No    Alcohol/week: 0.0 standard drinks of alcohol   Drug use: Yes    Types: Marijuana   Allergies   Patient has no known allergies.  Review of Systems Review of Systems Pertinent findings revealed after performing a 14 point review of systems has been noted in the history of present illness.  Physical Exam Vital Signs BP (!) 164/85 (BP Location: Right Arm)   Pulse 98   Temp 98.9 F (37.2 C) (Oral)   Resp 16   LMP 12/15/2022   SpO2 97%   No data found.  Physical Exam Vitals and nursing note reviewed.  Constitutional:      General: She is not in acute distress.    Appearance: Normal appearance.  HENT:     Head: Normocephalic and atraumatic.  Eyes:     Pupils: Pupils are equal, round, and reactive to light.  Cardiovascular:     Rate and Rhythm: Normal rate and regular rhythm.  Pulmonary:     Effort: Pulmonary effort is normal.     Breath sounds: Normal breath sounds.  Genitourinary:    Comments: Deferred Musculoskeletal:        General: Normal range of motion.     Cervical back: Normal range of motion and neck supple.  Skin:    General: Skin is warm and dry.  Neurological:      General: No focal deficit present.     Mental Status: She is alert and oriented to person, place, and time. Mental status is at baseline.  Psychiatric:        Mood and Affect: Mood normal.        Behavior: Behavior normal.        Thought Content: Thought content normal.        Judgment: Judgment normal.     Visual Acuity Right Eye Distance:   Left Eye Distance:   Bilateral Distance:    Right Eye Near:   Left Eye Near:    Bilateral Near:     UC Couse / Diagnostics / Procedures:     Radiology No results found.  Procedures Procedures (including critical care time) EKG  Pending results:  Labs Reviewed - No data to display  Medications Ordered in UC: Medications - No data to display  UC Diagnoses / Final Clinical Impressions(s)   I have reviewed the triage vital signs and the nursing notes.  Pertinent labs & imaging results that were available during my care of the patient were reviewed by me and considered in my medical decision making (see chart for details).    Final diagnoses:  Vulvar irritation   Patient advised further evaluation not indicated as she has already had negative STD testing and no presence of BV or vaginal Candida was seen.  As patient has not been adhering to recommendations from provider on December 4, recommend that she do so now.  Have added lidocaine ointment for comfort, provided her with an image of CeraVe hydrating body wash so she will know what products purchase at the drugstore.  Have recommended Alvera Novel for washing lingerie.  Patient advised not to use a washcloth to clean vaginal area.  Patient encouraged to consider scheduling appointment with a gynecologist  or dermatologist for further evaluation if no improvement with these conservative recommendations after 5 to 7 days.  Please see discharge instructions below for details of plan of care as provided to patient. ED Prescriptions     Medication Sig Dispense Auth. Provider   lidocaine  (XYLOCAINE) 5 % ointment Apply 1 Application topically 4 (four) times daily as needed. 35.44 g Theadora Rama Scales, PA-C      PDMP not reviewed this encounter.  Pending results:  Labs Reviewed - No data to display    Discharge Instructions      Please continue using hydrocortisone cream twice daily.  You can add lidocaine ointment on top of the hydrocortisone cream for relief of burning and itching.  Please consider washing your silicates with Alvera Novel or any other laundry detergent recommended for baby skin.  Avoid using washcloth in your vaginal area.  Use a soap that is hypoallergenic such as CeraVe hydrating body wash.  Please consider scheduling appointment with gynecologist for further evaluation if you do not have meaningful improvement of your symptoms over the next 5 to 7 days.  Thank you for visiting [facility] today.    Disposition Upon Discharge:  Condition: stable for discharge home  Patient presented with an acute illness with associated systemic symptoms and significant discomfort requiring urgent management. In my opinion, this is a condition that a prudent lay person (someone who possesses an average knowledge of health and medicine) may potentially expect to result in complications if not addressed urgently such as respiratory distress, impairment of bodily function or dysfunction of bodily organs.   Routine symptom specific, illness specific and/or disease specific instructions were discussed with the patient and/or caregiver at length.   As such, the patient has been evaluated and assessed, work-up was performed and treatment was provided in alignment with urgent care protocols and evidence based medicine.  Patient/parent/caregiver has been advised that the patient may require follow up for further testing and treatment if the symptoms continue in spite of treatment, as clinically indicated and appropriate.  Patient/parent/caregiver has been advised  to return to the [facility] or PCP if no better; to PCP or the Emergency Department if new signs and symptoms develop, or if the current signs or symptoms continue to change or worsen for further workup, evaluation and treatment as clinically indicated and appropriate  The patient will follow up with their current PCP if and as advised. If the patient does not currently have a PCP we will assist them in obtaining one.   The patient may need specialty follow up if the symptoms continue, in spite of conservative treatment and management, for further workup, evaluation, consultation and treatment as clinically indicated and appropriate.  Patient/parent/caregiver verbalized understanding and agreement of plan as discussed.  All questions were addressed during visit.  Please see discharge instructions below for further details of plan.  This office note has been dictated using Teaching laboratory technician.  Unfortunately, this method of dictation can sometimes lead to typographical or grammatical errors.  I apologize for your inconvenience in advance if this occurs.  Please do not hesitate to reach out to me if clarification is needed.      Theadora Rama Scales, PA-C 01/08/23 1358

## 2023-01-08 NOTE — Discharge Instructions (Signed)
Please continue using hydrocortisone cream twice daily.  You can add lidocaine ointment on top of the hydrocortisone cream for relief of burning and itching.  Please consider washing your silicates with Alvera Novel or any other laundry detergent recommended for baby skin.  Avoid using washcloth in your vaginal area.  Use a soap that is hypoallergenic such as CeraVe hydrating body wash.  Please consider scheduling appointment with gynecologist for further evaluation if you do not have meaningful improvement of your symptoms over the next 5 to 7 days.  Thank you for visiting [facility] today.

## 2023-01-08 NOTE — ED Triage Notes (Signed)
Sunday started having vaginal itching, irritation. Denies abdominal pain, sexual partners, n/v/d. Reports she had STD testing yesterday was negative. States they gave her fluconazole, states she took it but symptoms have not improved. Patient believes she may have cut her vaginal area on a washcloth

## 2023-05-27 ENCOUNTER — Ambulatory Visit: Admission: EM | Admit: 2023-05-27 | Discharge: 2023-05-27 | Disposition: A | Discharge status: Home / Self Care

## 2023-05-27 DIAGNOSIS — E1165 Type 2 diabetes mellitus with hyperglycemia: Secondary | ICD-10-CM | POA: Diagnosis not present

## 2023-05-27 DIAGNOSIS — R197 Diarrhea, unspecified: Secondary | ICD-10-CM | POA: Diagnosis not present

## 2023-05-27 DIAGNOSIS — J09X2 Influenza due to identified novel influenza A virus with other respiratory manifestations: Secondary | ICD-10-CM | POA: Diagnosis not present

## 2023-05-27 DIAGNOSIS — Z7984 Long term (current) use of oral hypoglycemic drugs: Secondary | ICD-10-CM | POA: Diagnosis not present

## 2023-05-27 HISTORY — DX: Chronic ischemic heart disease, unspecified: I25.9

## 2023-05-27 LAB — POC COVID19/FLU A&B COMBO
Covid Antigen, POC: NEGATIVE
Influenza A Antigen, POC: POSITIVE — AB
Influenza B Antigen, POC: NEGATIVE

## 2023-05-27 LAB — POCT FASTING CBG KUC MANUAL ENTRY: POCT Glucose (KUC): 195 mg/dL — AB (ref 70–99)

## 2023-05-27 MED ORDER — FEXOFENADINE HCL 180 MG PO TABS: 180.0000 mg | ORAL_TABLET | Freq: Every day | ORAL | 0 refills | Status: DC

## 2023-05-27 MED ORDER — FLUTICASONE PROPIONATE 50 MCG/ACT NA SUSP: 2.0000 | Freq: Every day | NASAL | 0 refills | Status: DC

## 2023-05-27 MED ORDER — BENZONATATE 200 MG PO CAPS: 200.0000 mg | ORAL_CAPSULE | Freq: Three times a day (TID) | ORAL | 0 refills | Status: DC | PRN

## 2023-05-27 MED ORDER — OSELTAMIVIR PHOSPHATE 75 MG PO CAPS: 75.0000 mg | ORAL_CAPSULE | Freq: Two times a day (BID) | ORAL | 0 refills | Status: DC

## 2023-05-27 MED ORDER — SEMAGLUTIDE(0.25 OR 0.5MG/DOS) 2 MG/3ML ~~LOC~~ SOPN: 0.5000 mg | PEN_INJECTOR | SUBCUTANEOUS | Status: DC

## 2023-05-27 NOTE — Discharge Instructions (Addendum)
 Please follow up with your primary care doctor to have your Hemoglobin A1C repeated. The last one was 12/'24 and you should have it done every 3 months. Make sure to request your diabetic supplied from your doctor so you can check your glucose daily.  Ask your PCP to send you to a gastroenterologist since your abdominal pain has been persisting.   Avoid any dairy until the diarrhea resolves

## 2023-05-27 NOTE — ED Triage Notes (Addendum)
 Pt c/o shortness of breath upon exertion, nasal congestion, diarrhea, sweats, vomiting episode x1, eye burning, and her sense of small/taste has decreased.  Start date: 05/21/2023  Home Interventions: Zyrtec, Penicillin  (expired)

## 2023-05-27 NOTE — ED Provider Notes (Signed)
 [facility]    CSN: [id number] Arrival date & time: 05/27/23  0854      History   Chief Complaint Chief Complaint  Patient presents with   Shortness of Breath   Nasal Congestion    HPI Lisa Cooley is a 38 y.o. female who presents stuffy nose yesterday, burning eyes and itching, felt her throat was closing during cooking time at 7 pm, then drank water and resolved. Poor appetie and lost sense of taste. Had sweating last night She started sneezing last night.  Had been feeling hot during the day yesterday. Has spisodes of feeling hot and cold yesterday. This am woke up with diarrhea and went x 2 since she left home.  She vomited in the middle of the night 2 nights ago x1 but has been fine since.   Has hx of chronic abdominal pain, so has not had more pain  than her usual with this illness. Has had a normal abdominal CT during her work up, but has not seen GI.  She admits she does not check her glucose at home. She ran out of her oral DM medications this week for a few days, but she has them back. All she had today is coffee with cream and sugar.  She admits she has not taken her DM or HTN medications in 2 days.   Past Medical History:  Diagnosis Date   Diabetes mellitus    Dx in [place]  but not on meds   Headache(784.0)    Healthcare maintenance 08/18/2018   Hypertension    Lumbar herniated disc    Myocardial ischemia    Sciatica    Screening for malignant neoplasm of cervix 12/20/2017    Patient Active Problem List   Diagnosis Date Noted   Chest pain 12/28/2022   Myopia with astigmatism, bilateral 11/20/2022   Myocardial ischemia 07/07/2022   Morbid obesity with body mass index (BMI) of 50.0 to 59.9 in adult (HCC) 05/14/2022   Routine screening for STI (sexually transmitted infection) 12/23/2021   Hidradenitis suppurativa 09/25/2021   Skin lesion 09/11/2021   Severe dysmenorrhea 04/10/2021   Left wrist pain 04/10/2021   Protrusion of lumbar  intervertebral disc 01/30/2021   Hypertension associated with diabetes (HCC) 08/18/2018   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   DM2 (diabetes mellitus, type 2) (HCC) 08/12/2011   Morbid obesity (HCC) 08/12/2011   Infertility, female 08/12/2011   Irregular periods 08/12/2011    Past Surgical History:  Procedure Laterality Date   NO PAST SURGERIES      OB History     Gravida  1   Para  0   Term  0   Preterm      AB  0   Living  0      SAB  0   IAB      Ectopic      Multiple      Live Births               Home Medications    Prior to Admission medications   Medication Sig Start Date End Date Taking? Authorizing Provider  adalimumab (HUMIRA) 80 MG/0.8ML pen Inject 160mg  (contents of 2 pens) into the skin on day 1 and inject 80mg  (contents of 1 pen) into the skin on day 15. Then start maintenance dose 2 weeks later 12/29/22  Yes [provider]  adalimumab (HUMIRA, 2 PEN,) 40 MG/0.4ML pen Inject 40 mg into the skin. 12/29/22  Yes [provider]  amLODipine  (NORVASC ) 10 MG tablet Take 1 tablet by mouth daily. 08/05/22 06/23/23 Yes [provider]  benzonatate  (TESSALON ) 200 MG capsule Take 1 capsule (200 mg total) by mouth 3 (three) times daily as needed for cough. 05/27/23  Yes Rodriguez-Southworth, Lamond Pilot, PA-C  Continuous Glucose Sensor (DEXCOM G7 SENSOR) MISC 1 Application by Other route. 04/16/22  Yes [provider]  doxycycline  (VIBRA -TABS) 100 MG tablet Take 1 tablet by mouth twice daily for 14 days as needed when flaring. Otherwise, take 1 tablet daily (for prevention). Take with 8 oz water. Do not lie down for at least 30 minutes after. 12/22/22 12/27/23 Yes [provider]  famotidine  (PEPCID ) 20 MG tablet Take 20 mg by mouth. 02/08/23  Yes [provider]  fexofenadine  (ALLEGRA  ALLERGY) 180 MG tablet Take 1 tablet (180 mg total) by mouth daily. 05/27/23  Yes Rodriguez-Southworth, Khameron Gruenwald, PA-C   fluticasone  (FLONASE ) 50 MCG/ACT nasal spray Place 2 sprays into both nostrils daily. 05/27/23  Yes Rodriguez-Southworth, Raynard Mapps, PA-C  losartan  (COZAAR ) 100 MG tablet Take 1 tablet by mouth daily. 12/28/22 12/28/23 Yes [provider]  miconazole  (MICOTIN) 2 % cream Apply 1 Application topically. 01/28/23  Yes [provider]  oseltamivir  (TAMIFLU ) 75 MG capsule Take 1 capsule (75 mg total) by mouth every 12 (twelve) hours. 05/27/23  Yes Rodriguez-Southworth, Rosha Cocker, PA-C  pantoprazole (PROTONIX) 40 MG tablet Take 40 mg by mouth. 10/16/22 10/16/23 Yes [provider]  propranolol (INDERAL) 20 MG tablet Take 1 tablet by mouth daily. 10/16/22  Yes [provider]  spironolactone (ALDACTONE) 25 MG tablet Take 1 tablet by mouth daily. 04/02/23 06/11/23 Yes [provider]  tiZANidine  (ZANAFLEX ) 2 MG tablet Take 2 mg by mouth. 05/04/23 06/06/23 Yes [provider]  blood glucose meter kit and supplies KIT Dispense based on patient and insurance preference. Use up to four times daily as directed. (FOR ICD-9 250.00, 250.01). 09/01/19   Valli Gaw, MD  Blood Glucose Monitoring Suppl (ONETOUCH VERIO) w/Device KIT Please use to check blood sugar up to once daily. E11.9 09/10/21   Ernestina Headland, MD  clindamycin  (CLEOCIN  T) 1 % lotion Apply topically 2 (two) times daily. Apply to underarms in affected areas. 09/12/21   Santana Cue, MD  Continuous Glucose Receiver (DEXCOM G7 RECEIVER) DEVI 1 each by Other route. 10/16/22   [provider]  diclofenac  Sodium (VOLTAREN ) 1 % GEL Apply 4 g topically 4 (four) times daily. 05/05/21   Valli Gaw, MD  DULoxetine  (CYMBALTA ) 60 MG capsule Take 1 capsule (60 mg total) by mouth daily. 07/29/21   Valli Gaw, MD  gabapentin  (NEURONTIN ) 300 MG capsule Take 3 capsules (900 mg total) by mouth 2 (two) times daily. 01/15/21   Joelle Musca, MD  glipiZIDE  (GLUCOTROL ) 5 MG tablet Take 1 tablet (5 mg total) by mouth daily before  breakfast. 10/25/20  Yes Valli Gaw, MD  glucose blood (ONETOUCH VERIO) test strip Please use to check blood sugar up to once daily. E11.9 09/10/21   Ernestina Headland, MD  glucose blood test strip Use as instructed 09/19/19   Anibal Kent, MD  ibuprofen  (ADVIL ) 600 MG tablet Take 1 tablet (600 mg total) by mouth every 8 (eight) hours as needed for up to 30 doses for fever, headache, mild pain (pain score 1-3) or moderate pain (pain score 4-6) (Inflammation). Take 1 tablet 3 times daily as needed for inflammation of upper airways and/or pain. 11/24/22   Eloise Hake Scales, PA-C  Lancet  Devices (ONETOUCH DELICA PLUS LANCING) MISC Please use to check blood sugar up to once daily. E11.9 09/10/21   Ernestina Headland, MD  Lancet Devices MISC 1 Container by Does not apply route 2 (two) times daily as needed. As directed. 09/19/19   Anibal Kent, MD  Lancets [facility] DELICA PLUS [place]) MISC Please use to check blood sugar up to once daily. E11.9 09/10/21   Ernestina Headland, MD  lidocaine  (XYLOCAINE ) 5 % ointment Apply 1 Application topically 4 (four) times daily as needed. 01/08/23   Eloise Hake Scales, PA-C  metFORMIN  (GLUCOPHAGE -XR) 500 MG 24 hr tablet Take 1 tablet (500 mg total) by mouth daily with breakfast. 08/04/21  Yes Dameron, Marisa, DO  methocarbamol  (ROBAXIN ) 500 MG tablet Take 1 tablet (500 mg total) by mouth every 8 (eight) hours as needed for muscle spasms. 02/18/22   Dameron, Marisa, DO  rosuvastatin  (CRESTOR ) 20 MG tablet Take 1 tablet (20 mg total) by mouth daily. 12/23/21   Dameron, Marisa, DO  Semaglutide ,0.25 or 0.5MG /DOS, 2 MG/3ML SOPN Inject 0.5 mg into the skin once a week. 05/27/23   Rodriguez-Southworth, Kadarious Dikes, PA-C  traMADol  (ULTRAM ) 50 MG tablet Take 1 tablet (50 mg total) by mouth every 12 (twelve) hours as needed. 03/13/22   Dameron, Raymona Caldwell, DO  albuterol  (VENTOLIN  HFA) 108 (90 Base) MCG/ACT inhaler Inhale 2 puffs into the lungs every 6 (six) hours as needed for wheezing or  shortness of breath. 08/30/19 09/13/19  Valli Gaw, MD    Family History Family History  Problem Relation Age of Onset   Cancer Father    Heart disease Sister    Other Neg Hx     Social History Social History   Tobacco Use   Smoking status: Former    Current packs/day: 0.00    Types: Cigarettes    Quit date: 04/20/2014    Years since quitting: 9.1   Smokeless tobacco: Never  Vaping Use   Vaping status: Never Used  Substance Use Topics   Alcohol use: No    Alcohol/week: 0.0 standard drinks of alcohol   Drug use: Yes    Types: Marijuana     Allergies   Patient has no known allergies.   Review of Systems Review of Systems As  noted in with HPI  Physical Exam Triage Vital Signs ED Triage Vitals  Encounter Vitals Group     BP 05/27/23 0915 (!) 163/91     Systolic BP Percentile --      Diastolic BP Percentile --      Pulse --      Resp 05/27/23 0915 18     Temp 05/27/23 0915 98.8 F (37.1 C)     Temp Source 05/27/23 0915 Oral     SpO2 05/27/23 0915 97 %     Weight --      Height --      Head Circumference --      Peak Flow --      Pain Score 05/27/23 0909 0     Pain Loc --      Pain Education --      Exclude from Growth Chart --    No data found.  Updated Vital Signs BP (!) 163/91 (BP Location: Right Arm)   Temp 98.8 F (37.1 C) (Oral)   Resp 18   LMP 05/04/2023 (Exact Date)   SpO2 97%   Visual Acuity Right Eye Distance:   Left Eye Distance:   Bilateral Distance:    Right Eye Near:  Left Eye Near:    Bilateral Near:     Physical Exam  Physical Exam Vitals signs and nursing note reviewed.  Constitutional:      General: She is not in acute distress.    Appearance: Normal appearance. She is not ill-appearing, toxic-appearing but is diaphoretic HENT:     Head: Normocephalic.     Right Ear: Tympanic membrane, ear canal and external ear normal.     Left Ear: Tympanic membrane, ear canal and external ear normal.     Nose: with moderate  mucosa congestion and clear mucous    Mouth/Throat: clear    Mouth: Mucous membranes are moist.  Eyes:     General: No scleral icterus.       Right eye: No discharge.        Left eye: No discharge.     Conjunctiva/sclera: Conjunctivae normal.  Neck:     Musculoskeletal: Neck supple. No neck rigidity.  Cardiovascular:     Rate and Rhythm: Normal rate and regular rhythm.     Heart sounds: No murmur.  Pulmonary:     Effort: Pulmonary effort is normal.     Breath sounds: Normal breath sounds.  Abdominal:     General: Bowel sounds are normal. There is no distension.     Palpations: Abdomen is soft. There is no mass.     Tenderness: There is moderate epigastric abdominal tenderness but this is not new. There is no guarding or rebound.     Hernia: No hernia is present.  Musculoskeletal: Normal range of motion.  Lymphadenopathy:     Cervical: No cervical adenopathy.  Skin:    General: Skin is warm and dry.     Coloration: Skin is not jaundiced.     Findings: No rash.  Neurological:     Mental Status: She is alert and oriented to person, place, and time.     Gait: Gait normal.  Psychiatric:        Mood and Affect: Mood normal.        Behavior: Behavior normal.        Thought Content: Thought content normal.        Judgment: Judgment normal.   UC Treatments / Results  Labs (all labs ordered are listed, but only abnormal results are displayed) Labs Reviewed  POC COVID19/FLU A&B COMBO - Abnormal; Notable for the following components:      Result Value   Influenza A Antigen, POC Positive (*)    All other components within normal limits  POCT FASTING CBG [facility] MANUAL ENTRY - Abnormal; Notable for the following components:   POCT Glucose ([facility]) 195 (*)    All other components within normal limits   Flu B and Covid are negative EKG   Radiology No results found.  Procedures Procedures (including critical care time)  Medications Ordered in UC Medications - No data to  display  Initial Impression / Assessment and Plan / UC Course  I have reviewed the triage vital signs and the nursing notes.  Pertinent labs  results that were available during my care of the patient were reviewed by me and considered in my medical decision making (see chart for details).  Influenza A Diarrhea  I placed her on Allegra , Flonase , Tamilfu, and Tessalon  as noted. BRAD diet advised Needs to get back on her DM and HTN meds today.  Needs to FU with PCP See instructions   Final Clinical Impressions(s) / UC Diagnoses   Final diagnoses:  Influenza  due to identified novel influenza A virus with other respiratory manifestations  Diarrhea, unspecified type  Uncontrolled type 2 diabetes mellitus with hyperglycemia [facility])     Discharge Instructions      Please follow up with your primary care doctor to have your Hemoglobin A1C repeated. The last one was 12/'24 and you should have it done every 3 months. Make sure to request your diabetic supplied from your doctor so you can check your glucose daily.  Ask your PCP to send you to a gastroenterologist since your abdominal pain has been persisting.   Avoid any dairy until the diarrhea resolves      ED Prescriptions     Medication Sig Dispense Auth. Provider   Semaglutide ,0.25 or 0.5MG /DOS, 2 MG/3ML SOPN Inject 0.5 mg into the skin once a week. -- Rodriguez-Southworth, Lamond Pilot, PA-C   fexofenadine  (ALLEGRA  ALLERGY) 180 MG tablet Take 1 tablet (180 mg total) by mouth daily. 30 tablet Rodriguez-Southworth, Lashonne Shull, PA-C   fluticasone  (FLONASE ) 50 MCG/ACT nasal spray Place 2 sprays into both nostrils daily. 18.2 g Rodriguez-Southworth, Tyree Vandruff, PA-C   oseltamivir  (TAMIFLU ) 75 MG capsule Take 1 capsule (75 mg total) by mouth every 12 (twelve) hours. 10 capsule Rodriguez-Southworth, Caley Volkert, PA-C   benzonatate  (TESSALON ) 200 MG capsule Take 1 capsule (200 mg total) by mouth 3 (three) times daily as needed for cough. 30 capsule  Rodriguez-Southworth, Otto Felkins, PA-C      PDMP not reviewed this encounter.   Vonda Guadeloupe, PA-C 05/27/23 1011

## 2023-07-28 DIAGNOSIS — Z7409 Other reduced mobility: Secondary | ICD-10-CM | POA: Insufficient documentation

## 2023-07-28 DIAGNOSIS — M5386 Other specified dorsopathies, lumbar region: Secondary | ICD-10-CM | POA: Insufficient documentation

## 2023-08-04 ENCOUNTER — Ambulatory Visit
Admission: EM | Admit: 2023-08-04 | Discharge: 2023-08-04 | Disposition: A | Discharge status: Home / Self Care | Attending: Internal Medicine | Admitting: Internal Medicine

## 2023-08-04 ENCOUNTER — Other Ambulatory Visit: Payer: Self-pay

## 2023-08-04 DIAGNOSIS — R8290 Unspecified abnormal findings in urine: Secondary | ICD-10-CM | POA: Diagnosis present

## 2023-08-04 DIAGNOSIS — R350 Frequency of micturition: Secondary | ICD-10-CM | POA: Diagnosis present

## 2023-08-04 DIAGNOSIS — Z113 Encounter for screening for infections with a predominantly sexual mode of transmission: Secondary | ICD-10-CM | POA: Insufficient documentation

## 2023-08-04 DIAGNOSIS — R3915 Urgency of urination: Secondary | ICD-10-CM | POA: Diagnosis not present

## 2023-08-04 DIAGNOSIS — E1165 Type 2 diabetes mellitus with hyperglycemia: Secondary | ICD-10-CM | POA: Insufficient documentation

## 2023-08-04 LAB — POCT URINALYSIS DIP (MANUAL ENTRY)
Bilirubin, UA: NEGATIVE
Blood, UA: NEGATIVE
Glucose, UA: NEGATIVE mg/dL
Ketones, POC UA: NEGATIVE mg/dL
Nitrite, UA: NEGATIVE
Protein Ur, POC: NEGATIVE mg/dL
Spec Grav, UA: 1.02 (ref 1.010–1.025)
Urobilinogen, UA: 0.2 U/dL
pH, UA: 6 (ref 5.0–8.0)

## 2023-08-04 LAB — POCT FASTING CBG KUC MANUAL ENTRY: POCT Glucose (KUC): 219 mg/dL — AB (ref 70–99)

## 2023-08-04 LAB — POCT URINE PREGNANCY: Preg Test, Ur: NEGATIVE

## 2023-08-04 MED ORDER — CIPROFLOXACIN HCL 500 MG PO TABS: 500.0000 mg | ORAL_TABLET | Freq: Two times a day (BID) | ORAL | 0 refills | Status: AC

## 2023-08-04 NOTE — Discharge Instructions (Signed)
 Your urine sample was consistent with a possible urinary tract infection. You were prescribed Cipro  which is an antibiotic that is often used to treat urinary tract infections.  Take the prescription as directed. A urine culture has been sent to the lab for further testing.  We will call you with those results.  If the prescription needs to be changed, it will be done so at that time.   Please go directly to the Emergency Department immediately should you begin to have any of the following symptoms: persistent fevers, increased pain or persistent nausea/vomiting.  Your swab was sent to the lab for further testing.  You will be called with results. You should avoid all sexual activity until you have been notified of all your results and have undergone any necessary treatment.  If you are positive, it is recommended that you inform all sexual partners so they can treat be treated as well before having sex again.

## 2023-08-04 NOTE — ED Provider Notes (Signed)
 [facility]  Note:  This document was prepared using Dragon voice recognition software and may include unintentional dictation errors.  MRN: [id number] DOB: 09/22/1985 DATE: 08/04/23   Subjective:  Chief Complaint:  Chief Complaint  Patient presents with   Dysuria     HPI: Lisa Cooley is a 38 y.o. female presenting for increased urinary frequency and urge for the past 5 days. Patient reports she has a history of chronic back pain from a slipped disc and sciatica. She states that she noticed that the back pain has changed and is now higher up. She states pain is usually lumbar, but now more in the thoracic region. She also reports she has been urinating more and has more of an urge. She became concerned about a possible UTI.  She reports no known STD exposures, but is requesting testing.  She has not taken anything for symptoms.  Of note patient is diabetic, but has not been checking her glucose regularly.  She states she is waiting for her new Dexcom to come in.  Per EMR, last point-of-care glucose was 170 about 3 weeks ago.  Denies fever, vomiting, vaginal discharge, vaginal odor, hematuria, dysuria. Endorses urinary frequency, urinary urgency, odor, back pain. Presents NAD.  Prior to Admission medications   Medication Sig Start Date End Date Taking? Authorizing Provider  adalimumab (HUMIRA) 80 MG/0.8ML pen Inject 160mg  (contents of 2 pens) into the skin on day 1 and inject 80mg  (contents of 1 pen) into the skin on day 15. Then start maintenance dose 2 weeks later 12/29/22   [provider]  adalimumab (HUMIRA, 2 PEN,) 40 MG/0.4ML pen Inject 40 mg into the skin. 12/29/22   [provider]  benzonatate  (TESSALON ) 200 MG capsule Take 1 capsule (200 mg total) by mouth 3 (three) times daily as needed for cough. 05/27/23   Rodriguez-Southworth, Kyra, PA-C  blood glucose meter kit and supplies KIT Dispense based on patient and insurance preference. Use up to four  times daily as directed. (FOR ICD-9 250.00, 250.01). 09/01/19   Hope Merle, MD  Blood Glucose Monitoring Suppl (ONETOUCH VERIO) w/Device KIT Please use to check blood sugar up to once daily. E11.9 09/10/21   Bryan Bianchi, MD  clindamycin  (CLEOCIN  T) 1 % lotion Apply topically 2 (two) times daily. Apply to underarms in affected areas. 09/12/21   Macario Dorothyann HERO, MD  Continuous Glucose Receiver (DEXCOM G7 RECEIVER) DEVI 1 each by Other route. 10/16/22   [provider]  Continuous Glucose Sensor (DEXCOM G7 SENSOR) MISC 1 Application by Other route. 04/16/22   [provider]  diclofenac  Sodium (VOLTAREN ) 1 % GEL Apply 4 g topically 4 (four) times daily. 05/05/21   Hope Merle, MD  DULoxetine  (CYMBALTA ) 60 MG capsule Take 1 capsule (60 mg total) by mouth daily. 07/29/21   Hope Merle, MD  famotidine  (PEPCID ) 20 MG tablet Take 20 mg by mouth. 02/08/23   [provider]  fexofenadine  (ALLEGRA  ALLERGY) 180 MG tablet Take 1 tablet (180 mg total) by mouth daily. 05/27/23   Rodriguez-Southworth, Sylvia, PA-C  fluticasone  (FLONASE ) 50 MCG/ACT nasal spray Place 2 sprays into both nostrils daily. 05/27/23   Rodriguez-Southworth, Sylvia, PA-C  gabapentin  (NEURONTIN ) 300 MG capsule Take 3 capsules (900 mg total) by mouth 2 (two) times daily. 01/15/21   Austin Ade, MD  glipiZIDE  (GLUCOTROL ) 5 MG tablet Take 1 tablet (5 mg total) by mouth daily before breakfast. 10/25/20   Hope Merle, MD  glucose blood (ONETOUCH VERIO) test strip  Please use to check blood sugar up to once daily. E11.9 09/10/21   Bryan Bianchi, MD  glucose blood test strip Use as instructed 09/19/19   Scarlet Elsie LABOR, MD  ibuprofen  (ADVIL ) 600 MG tablet Take 1 tablet (600 mg total) by mouth every 8 (eight) hours as needed for up to 30 doses for fever, headache, mild pain (pain score 1-3) or moderate pain (pain score 4-6) (Inflammation). Take 1 tablet 3 times daily as needed for inflammation of upper airways and/or pain.  11/24/22   Joesph Shaver Scales, PA-C  Lancet Devices [facility] DELICA PLUS LANCING) MISC Please use to check blood sugar up to once daily. E11.9 09/10/21   Bryan Bianchi, MD  Lancet Devices MISC 1 Container by Does not apply route 2 (two) times daily as needed. As directed. 09/19/19   Scarlet Elsie LABOR, MD  Lancets [facility] DELICA PLUS [place]) MISC Please use to check blood sugar up to once daily. E11.9 09/10/21   Bryan Bianchi, MD  lidocaine  (XYLOCAINE ) 5 % ointment Apply 1 Application topically 4 (four) times daily as needed. 01/08/23   Joesph Shaver Scales, PA-C  losartan  (COZAAR ) 100 MG tablet Take 1 tablet by mouth daily. 12/28/22 12/28/23  [provider]  metFORMIN  (GLUCOPHAGE -XR) 500 MG 24 hr tablet Take 1 tablet (500 mg total) by mouth daily with breakfast. 08/04/21   Dameron, Marisa, DO  methocarbamol  (ROBAXIN ) 500 MG tablet Take 1 tablet (500 mg total) by mouth every 8 (eight) hours as needed for muscle spasms. 02/18/22   Dameron, Marisa, DO  pantoprazole (PROTONIX) 40 MG tablet Take 40 mg by mouth. 10/16/22 10/16/23  [provider]  propranolol (INDERAL) 20 MG tablet Take 1 tablet by mouth daily. 10/16/22   [provider]  rosuvastatin  (CRESTOR ) 20 MG tablet Take 1 tablet (20 mg total) by mouth daily. 12/23/21   Dameron, Marisa, DO  Semaglutide ,0.25 or 0.5MG /DOS, 2 MG/3ML SOPN Inject 0.5 mg into the skin once a week. 05/27/23   Rodriguez-Southworth, Sylvia, PA-C  spironolactone (ALDACTONE) 25 MG tablet Take 1 tablet by mouth daily. 04/02/23 06/11/23  [provider]  traMADol  (ULTRAM ) 50 MG tablet Take 1 tablet (50 mg total) by mouth every 12 (twelve) hours as needed. 03/13/22   Dameron, Marisa, DO  albuterol  (VENTOLIN  HFA) 108 (90 Base) MCG/ACT inhaler Inhale 2 puffs into the lungs every 6 (six) hours as needed for wheezing or shortness of breath. 08/30/19 09/13/19  Hope Merle, MD     No Known Allergies  History:   Past Medical History:  Diagnosis  Date   Diabetes mellitus    Dx in [place]  but not on meds   Headache(784.0)    Healthcare maintenance 08/18/2018   Hypertension    Lumbar herniated disc    Myocardial ischemia    Sciatica    Screening for malignant neoplasm of cervix 12/20/2017     Past Surgical History:  Procedure Laterality Date   NO PAST SURGERIES      Family History  Problem Relation Age of Onset   Cancer Father    Heart disease Sister    Other Neg Hx     Social History   Tobacco Use   Smoking status: Former    Current packs/day: 0.00    Types: Cigarettes    Quit date: 04/20/2014    Years since quitting: 9.2   Smokeless tobacco: Never  Vaping Use   Vaping status: Never Used  Substance Use Topics   Alcohol use: No    Alcohol/week:  0.0 standard drinks of alcohol   Drug use: Yes    Types: Marijuana    Review of Systems  Constitutional:  Negative for fever.  Gastrointestinal:  Positive for abdominal pain and nausea. Negative for vomiting.  Genitourinary:  Positive for flank pain, frequency and urgency. Negative for dysuria, hematuria and vaginal discharge.  Musculoskeletal:  Positive for back pain.     Objective:   Vitals: BP 129/85 (BP Location: Right Arm)   Pulse (!) 102   Temp 99.5 F (37.5 C) (Oral)   Resp 16   LMP 06/22/2023   SpO2 99%   Physical Exam Constitutional:      General: She is not in acute distress.    Appearance: Normal appearance. She is well-developed. She is morbidly obese. She is not ill-appearing or toxic-appearing.  HENT:     Head: Normocephalic and atraumatic.  Cardiovascular:     Rate and Rhythm: Normal rate and regular rhythm.     Heart sounds: Normal heart sounds.  Pulmonary:     Effort: Pulmonary effort is normal.     Breath sounds: Normal breath sounds.     Comments: Clear to auscultation bilaterally  Abdominal:     General: Bowel sounds are normal.     Palpations: Abdomen is soft.     Tenderness: There is no abdominal tenderness. There is right  CVA tenderness. There is no left CVA tenderness.  Skin:    General: Skin is warm and dry.  Neurological:     General: No focal deficit present.     Mental Status: She is alert.  Psychiatric:        Mood and Affect: Mood and affect normal.     Results:  Labs: Results for orders placed or performed during the hospital encounter of 08/04/23 (from the past 24 hours)  POCT urinalysis dipstick     Status: Abnormal   Collection Time: 08/04/23 10:00 AM  Result Value Ref Range   Color, UA yellow yellow   Clarity, UA cloudy (A) clear   Glucose, UA negative negative mg/dL   Bilirubin, UA negative negative   Ketones, POC UA negative negative mg/dL   Spec Grav, UA 8.979 8.989 - 1.025   Blood, UA negative negative   pH, UA 6.0 5.0 - 8.0   Protein Ur, POC negative negative mg/dL   Urobilinogen, UA 0.2 0.2 or 1.0 E.U./dL   Nitrite, UA Negative Negative   Leukocytes, UA Trace (A) Negative  POCT urine pregnancy     Status: Normal   Collection Time: 08/04/23 10:09 AM  Result Value Ref Range   Preg Test, Ur Negative Negative    Radiology: No results found.   UC Course/Treatments:  Procedures: Procedures   Medications Ordered in UC: Medications - No data to display   Assessment and Plan :     ICD-10-CM   1. Urinary frequency  R35.0 Urine Culture    Urine Culture    2. Malodorous urine  R82.90 Urine Culture    Urine Culture    3. Urinary urgency  R39.15 Urine Culture    Urine Culture    4. Screening for STDs (sexually transmitted diseases)  Z11.3      Urinary frequency Malodorous urine Urinary urgency Afebrile, nontoxic-appearing, NAD. VSS. DDX includes but not limited to: Cystitis, pyelonephritis, nephrolithiasis, secondary to uncontrolled diabetes, pregnancy UA was positive for trace leukocytes.  UPT was negative.  Urine culture is pending. Patient reported more midline thoracic pain; however, on exam was TTP at right  flank. Given exam findings, Cipro  500 mg twice daily  was prescribed.  Will adjust treatment plan based on culture results.  Cytology is pending.  Last A1C was 11.46 months ago. CBG today was 219. Explained that frequency could be due to uncontrolled diabetes as well. Recommend follow-up with PCP as well as recommend she monitor her glucose more frequently while waiting for her Dexcom to come in. Strict ED precautions were given and patient verbalized understanding.  Screening for STDs (sexually transmitted diseases) Afebrile, nontoxic-appearing, NAD. VSS. Cytology is pending. Will wait for results prior to treatment.  Safe sex precautions advised.  Strict ED precautions were given and patient verbalized understanding.   ED Discharge Orders          Ordered    ciprofloxacin  (CIPRO ) 500 MG tablet  Every 12 hours        08/04/23 1012             PDMP not reviewed this encounter.     Parke Jandreau P, PA-C 08/04/23 1030

## 2023-08-04 NOTE — ED Triage Notes (Signed)
 C/O urinary frequency, generalized back pain for 5 days. Patient C/O urgency. Patient has not taken any OTC meds for symptoms. Denies fever or chills.

## 2023-08-06 LAB — URINE CULTURE: Culture: NO GROWTH

## 2023-08-09 LAB — CERVICOVAGINAL ANCILLARY ONLY
Chlamydia: NEGATIVE
Comment: NEGATIVE
Comment: NEGATIVE
Comment: NORMAL
Neisseria Gonorrhea: NEGATIVE
Trichomonas: NEGATIVE

## 2023-11-09 DIAGNOSIS — E785 Hyperlipidemia, unspecified: Secondary | ICD-10-CM | POA: Insufficient documentation

## 2023-11-09 DIAGNOSIS — R300 Dysuria: Secondary | ICD-10-CM | POA: Insufficient documentation

## 2023-11-29 ENCOUNTER — Ambulatory Visit
Admission: EM | Admit: 2023-11-29 | Discharge: 2023-11-29 | Disposition: A | Discharge status: Home / Self Care | Attending: Internal Medicine | Admitting: Internal Medicine

## 2023-11-29 ENCOUNTER — Other Ambulatory Visit: Payer: Self-pay

## 2023-11-29 DIAGNOSIS — R519 Headache, unspecified: Secondary | ICD-10-CM | POA: Diagnosis not present

## 2023-11-29 DIAGNOSIS — G8929 Other chronic pain: Secondary | ICD-10-CM | POA: Diagnosis not present

## 2023-11-29 DIAGNOSIS — M5442 Lumbago with sciatica, left side: Secondary | ICD-10-CM

## 2023-11-29 MED ORDER — KETOROLAC TROMETHAMINE 30 MG/ML IJ SOLN
30.0000 mg | Freq: Once | INTRAMUSCULAR | Status: AC
  Administered 2023-11-29: 30 mg via INTRAMUSCULAR

## 2023-11-29 NOTE — ED Triage Notes (Signed)
 C/O headaches off and on x one month. Patient states she takes tylenol  for the headaches without relief. Denies nausea or vomiting. Patient also C/O lower back pain off and on and states she has a chronic back pain issue. Patient states she goes to pain mgt for injections.

## 2023-11-29 NOTE — Discharge Instructions (Signed)
 You were given an injection (Toradol ) for your headache and back pain today. This will help with the pain and inflammation. Avoid taking any more NSAIDs today such as ibuprofen , Motrin , Advil , Aleve  due to injection today.  Your evaluation was not suggestive of any emergent condition requiring medical intervention at this time. However, our office is limited in the tests and imaging we can perform at our facility.  Therefore, it is very important for you to pay attention to any new symptoms or worsening of your current condition. Please go directly to the Emergency Department immediately should you begin to feel worse in any way or have any of the following symptoms: numbness/tingling, facial dropping, slurred speech, vision changes, persistent nausea/vomiting or fevers develop.  I recommend you follow up with your PCP as well to discuss your frequently headaches recently.   The injection will help with your back as well. I recommend you follow up with pain management again or orthopedics.   Your evaluation was not suggestive of any emergent condition requiring medical intervention at this time. However, our office is limited in the tests and imaging we can perform at our facility.  Therefore, it is very important for you to pay attention to any new symptoms or worsening of your current condition.   Please go directly to the Emergency Department immediately should you begin to feel worse in any way or have any of the following symptoms: bowel/urinary incontinence, numbness between your legs, fever new onset numbness/tingling.

## 2023-11-29 NOTE — ED Provider Notes (Signed)
 [facility]  Note:  This document was prepared using Dragon voice recognition software and may include unintentional dictation errors.  MRN: [id number] DOB: Jul 24, 1985 DATE: 11/29/23   Subjective:  Chief Complaint:  Chief Complaint  Patient presents with   Headache     HPI: Lisa Cooley is a 38 y.o. female presenting for multiple issues.  First, patient reports headache for one day. Patient reports recent increase in headaches. Reports that she has been having headaches on and off for one month. She states last night she started with a headache at bedtime. She reports taking 2 tylenol  around 4-5am. She states she went back to sleep and when she woke up the headache had resolved. However, she states her friend call her for a ride and the headache has since returned. She reports that it is frontal currently, but was all over last night. She states that sometimes she has photophobia and nausea with her headaches, but none currently.   Second, patient presents for chronic low back pain. Patient reports that she has a history of sciatica with radiation into her left leg. She reports that her back hurts all the time. She has seen pain management in the past for injections in her back, but states they are only temporary. She has not seen pain management in awhile. Denies fever, nausea/vomiting, abdominal pain, dysuria, hematuria, vision changes, loss of vision. Endorses headache, low back pain. Presents NAD.  Prior to Admission medications   Medication Sig Start Date End Date Taking? Authorizing Provider  adalimumab (HUMIRA) 80 MG/0.8ML pen Inject 160mg  (contents of 2 pens) into the skin on day 1 and inject 80mg  (contents of 1 pen) into the skin on day 15. Then start maintenance dose 2 weeks later 12/29/22   [provider]  adalimumab (HUMIRA, 2 PEN,) 40 MG/0.4ML pen Inject 40 mg into the skin. 12/29/22   [provider]  benzonatate  (TESSALON ) 200 MG capsule  Take 1 capsule (200 mg total) by mouth 3 (three) times daily as needed for cough. 05/27/23   Rodriguez-Southworth, Kyra, PA-C  blood glucose meter kit and supplies KIT Dispense based on patient and insurance preference. Use up to four times daily as directed. (FOR ICD-9 250.00, 250.01). 09/01/19   Hope Merle, MD  Blood Glucose Monitoring Suppl (ONETOUCH VERIO) w/Device KIT Please use to check blood sugar up to once daily. E11.9 09/10/21   Bryan Bianchi, MD  clindamycin  (CLEOCIN  T) 1 % lotion Apply topically 2 (two) times daily. Apply to underarms in affected areas. 09/12/21   Macario Dorothyann HERO, MD  Continuous Glucose Sensor (DEXCOM G7 SENSOR) MISC 1 Application by Other route. 04/16/22   [provider]  diclofenac  Sodium (VOLTAREN ) 1 % GEL Apply 4 g topically 4 (four) times daily. 05/05/21   Hope Merle, MD  DULoxetine  (CYMBALTA ) 60 MG capsule Take 1 capsule (60 mg total) by mouth daily. 07/29/21   Hope Merle, MD  famotidine  (PEPCID ) 20 MG tablet Take 20 mg by mouth. 02/08/23   [provider]  fexofenadine  (ALLEGRA  ALLERGY) 180 MG tablet Take 1 tablet (180 mg total) by mouth daily. 05/27/23   Rodriguez-Southworth, Sylvia, PA-C  fluticasone  (FLONASE ) 50 MCG/ACT nasal spray Place 2 sprays into both nostrils daily. 05/27/23   Rodriguez-Southworth, Sylvia, PA-C  gabapentin  (NEURONTIN ) 300 MG capsule Take 3 capsules (900 mg total) by mouth 2 (two) times daily. 01/15/21   Austin Ade, MD  glipiZIDE  (GLUCOTROL ) 5 MG tablet Take 1 tablet (5 mg total) by mouth daily before  breakfast. 10/25/20   Hope Merle, MD  glucose blood [facility] VERIO) test strip Please use to check blood sugar up to once daily. E11.9 09/10/21   Bryan Bianchi, MD  glucose blood test strip Use as instructed 09/19/19   Scarlet Elsie LABOR, MD  ibuprofen  (ADVIL ) 600 MG tablet Take 1 tablet (600 mg total) by mouth every 8 (eight) hours as needed for up to 30 doses for fever, headache, mild pain (pain score 1-3) or moderate pain  (pain score 4-6) (Inflammation). Take 1 tablet 3 times daily as needed for inflammation of upper airways and/or pain. 11/24/22   Joesph Shaver Scales, PA-C  Lancet Devices [facility] DELICA PLUS LANCING) MISC Please use to check blood sugar up to once daily. E11.9 09/10/21   Bryan Bianchi, MD  Lancet Devices MISC 1 Container by Does not apply route 2 (two) times daily as needed. As directed. 09/19/19   Scarlet Elsie LABOR, MD  Lancets [facility] DELICA PLUS [place]) MISC Please use to check blood sugar up to once daily. E11.9 09/10/21   Bryan Bianchi, MD  lidocaine  (XYLOCAINE ) 5 % ointment Apply 1 Application topically 4 (four) times daily as needed. 01/08/23   Joesph Shaver Scales, PA-C  losartan  (COZAAR ) 100 MG tablet Take 1 tablet by mouth daily. 12/28/22 12/28/23  [provider]  metFORMIN  (GLUCOPHAGE -XR) 500 MG 24 hr tablet Take 1 tablet (500 mg total) by mouth daily with breakfast. 08/04/21   Dameron, Marisa, DO  methocarbamol  (ROBAXIN ) 500 MG tablet Take 1 tablet (500 mg total) by mouth every 8 (eight) hours as needed for muscle spasms. 02/18/22   Dameron, Marisa, DO  pantoprazole (PROTONIX) 40 MG tablet Take 40 mg by mouth. 10/16/22 10/16/23  [provider]  propranolol (INDERAL) 20 MG tablet Take 1 tablet by mouth daily. 10/16/22   [provider]  rosuvastatin  (CRESTOR ) 20 MG tablet Take 1 tablet (20 mg total) by mouth daily. 12/23/21   Dameron, Marisa, DO  Semaglutide ,0.25 or 0.5MG /DOS, 2 MG/3ML SOPN Inject 0.5 mg into the skin once a week. 05/27/23   Rodriguez-Southworth, Sylvia, PA-C  spironolactone (ALDACTONE) 25 MG tablet Take 1 tablet by mouth daily. 04/02/23 06/11/23  [provider]  traMADol  (ULTRAM ) 50 MG tablet Take 1 tablet (50 mg total) by mouth every 12 (twelve) hours as needed. 03/13/22   Dameron, Barabara, DO  albuterol  (VENTOLIN  HFA) 108 (90 Base) MCG/ACT inhaler Inhale 2 puffs into the lungs every 6 (six) hours as needed for wheezing or shortness of  breath. 08/30/19 09/13/19  Hope Merle, MD     No Known Allergies  History:   Past Medical History:  Diagnosis Date   Diabetes mellitus    Dx in [place]  but not on meds   Headache(784.0)    Healthcare maintenance 08/18/2018   Hypertension    Lumbar herniated disc    Myocardial ischemia    Sciatica    Screening for malignant neoplasm of cervix 12/20/2017     Past Surgical History:  Procedure Laterality Date   NO PAST SURGERIES      Family History  Problem Relation Age of Onset   Cancer Father    Heart disease Sister    Other Neg Hx     Social History   Tobacco Use   Smoking status: Former    Current packs/day: 0.00    Types: Cigarettes    Quit date: 04/20/2014    Years since quitting: 9.6   Smokeless tobacco: Never  Vaping Use   Vaping status: Never  Used  Substance Use Topics   Alcohol use: No    Alcohol/week: 0.0 standard drinks of alcohol   Drug use: Not Currently    Types: Marijuana    Review of Systems  Constitutional:  Negative for fever.  Eyes:  Negative for photophobia and visual disturbance.  Gastrointestinal:  Negative for abdominal pain, nausea and vomiting.  Genitourinary:  Negative for dysuria and hematuria.  Musculoskeletal:  Positive for back pain.  Neurological:  Positive for headaches.     Objective:   Vitals: BP (!) 140/90 (BP Location: Right Arm)   Pulse 90   Temp 99.4 F (37.4 C) (Oral)   Resp 20   LMP 11/18/2023   SpO2 95%   Physical Exam Constitutional:      General: She is not in acute distress.    Appearance: Normal appearance. She is well-developed. She is morbidly obese. She is not ill-appearing or toxic-appearing.  HENT:     Head: Normocephalic and atraumatic.  Eyes:     Extraocular Movements: Extraocular movements intact.     Pupils: Pupils are equal, round, and reactive to light.  Cardiovascular:     Rate and Rhythm: Normal rate and regular rhythm.     Heart sounds: Normal heart sounds.  Pulmonary:     Effort:  Pulmonary effort is normal.     Breath sounds: Normal breath sounds.     Comments: Clear to auscultation bilaterally  Abdominal:     General: Bowel sounds are normal.     Palpations: Abdomen is soft.     Tenderness: There is no abdominal tenderness. There is no right CVA tenderness or left CVA tenderness.  Musculoskeletal:     Lumbar back: Normal.  Skin:    General: Skin is warm and dry.  Neurological:     General: No focal deficit present.     Mental Status: She is alert.     GCS: GCS eye subscore is 4. GCS verbal subscore is 5. GCS motor subscore is 6.     Cranial Nerves: Cranial nerves 2-12 are intact.  Psychiatric:        Mood and Affect: Mood and affect normal.     Results:  Labs: No results found for this or any previous visit (from the past 24 hours).  Radiology: No results found.   UC Course/Treatments:  Procedures: Procedures   Medications Ordered in UC: Medications  ketorolac  (TORADOL ) 30 MG/ML injection 30 mg (has no administration in time range)     Assessment and Plan :     ICD-10-CM   1. Acute nonintractable headache, unspecified headache type  R51.9     2. Chronic bilateral low back pain with left-sided sciatica  G89.29    M54.42      Acute nonintractable headache, unspecified headache type Afebrile, nontoxic-appearing, NAD. VSS. DDX includes but not limited to: tension headache, migraine, stress headache, stroke, aneurysm, secondary to hypertension, secondary diabetes Reports recent increase in headaches this month. Relief with tylenol  in the past, but headache returned this morning. Neuro exam unremarkable. Given photophobia and nausea at times, questionable migraine headaches. Toradol  30mg  IM was given today in office for her headache. Recommend follow up with PCP to discuss more long term management if headaches continue to occur frequently. Strict ED precautions were given and patient verbalized understanding.  Chronic bilateral low back pain  with left-sided sciatica Afebrile, nontoxic-appearing, NAD. VSS. Previously diagnosed. Patient has seen pain management in the past for injections, but feel they provided only temporary relief.  Toradol  30mg  IM was given today in office for pain. Appears to have been on tramadol  and gabapentin  in the past. Recommend follow up with pain management. Strict ED precautions were given and patient verbalized understanding.  ED Discharge Orders     None        I have reviewed the PDMP during this encounter.      Meghen Akopyan P, PA-C 11/29/23 1359

## 2023-12-28 NOTE — ED Provider Notes (Signed)
 ------------------------------------------------------------------------------- Attestation signed by [place] DELENA Chapel, DO at 12/28/23 1420 I was present in the Emergency Department during the care of this patient.  -------------------------------------------------------------------------------   [facility]  ED Provider Note  Lisa Cooley 38 y.o. female DOB: 1985/03/16 MRN: [id number] History   Chief Complaint  Patient presents with   Rectal Bleeding    Reddish/pink + NVD + central upper abd pain 8/10 x saturday.    Pt has a hx of GERD and is on Pepcid  and Protonix. She's been on  Mounjaro for weight loss. She's had burning epigastric pain after eating for a few days. Today she c/o loose stools with bright red blood when wiping. Her stools are normal brown with small amounts of hematochezia. She denies any heavy bleeding. No rectal pain. No other complaints    History provided by:  Patient and medical records Rectal Bleeding Quality:  Bright red Amount:  Scant Timing: once. Progression:  Unchanged Chronicity:  New Relieved by:  Nothing Worsened by:  Nothing tried Ineffective treatments:  None tried Associated symptoms: no abdominal pain, no dizziness, no fever, no light-headedness and no vomiting        Past Medical History[1]  Past Surgical History[2]  Social History   Substance and Sexual Activity  Alcohol Use Not Currently   Tobacco Use History[3] E-Cigarettes   Vaping Use Never User    Start Date     Cartridges/Day     Quit Date     Social History   Substance and Sexual Activity  Drug Use Yes   Types: Marijuana   Comment: daily         Allergies[4]  Discharge Medication List as of 12/28/2023 10:54 AM     CONTINUE these medications which have NOT CHANGED   Details  amLODIPine  besylate (NORVASC ) 10 mg tablet Take one tablet (10 mg dose) by mouth daily., Starting Wed 08/05/2022, Normal    clindamycin   (CLEOCIN  T) 1 % lotion Apply topically., Starting Fri 09/12/2021, Historical Med    DULoxetine  HCl (CYMBALTA ) 60 mg capsule Take one capsule (60 mg dose) by mouth., Starting Tue 07/29/2021, Historical Med    ibuprofen  (ADVIL ,MOTRIN ) 800 mg tablet Take one tablet (800 mg dose) by mouth every 8 (eight) hours as needed for Pain., Starting Sat 05/02/2022, Normal    losartan -hydrochlorothiazide  (HYZAAR ) 50-12.5 mg per tablet Take one tablet by mouth daily., Starting Tue 12/23/2021, Historical Med    metFORMIN  ER (GLUCOPHAGE -XR) 500 mg 24 hr tablet Take one tablet (500 mg dose) by mouth 1 (one) time each day with breakfast., Starting Mon 08/04/2021, Historical Med    methocarbamol  (ROBAXIN ) 500 MG tablet Take one tablet (500 mg dose) by mouth 4 (four) times a day as needed., Starting Sat 05/02/2022, Normal    rosuvastatin  calcium  (CRESTOR ) 20 mg tablet Take one tablet (20 mg dose) by mouth daily., Starting Tue 12/23/2021, Historical Med        Primary Survey   Exposure    No visible abdominal trauma.       Review of Systems   Review of Systems  Constitutional: Negative.  Negative for chills, diaphoresis and fever.  Respiratory:  Negative for shortness of breath.   Gastrointestinal:  Positive for blood in stool and hematochezia. Negative for abdominal pain, constipation, diarrhea, nausea, rectal pain and vomiting.  Genitourinary:  Negative for dysuria, flank pain, pelvic pain, vaginal bleeding and vaginal discharge.  Musculoskeletal:  Negative for back pain.  Neurological:  Negative for dizziness and light-headedness.  All other systems reviewed and are negative.   Physical Exam   ED Triage Vitals  BP 12/28/23 0842 (!) 185/99  Heart Rate 12/28/23 0843 109  Resp 12/28/23 0843 20  SpO2 12/28/23 0843 99 %  Temp 12/28/23 0843 97.9 F (36.6 C)    Physical Exam  Nursing note and vitals reviewed. Constitutional: She appears well-developed and well-nourished.  HENT:  Head:  Normocephalic.  Eyes: EOM are intact. Conjunctivae are normal. Pupils are equal, round, and reactive to light. No scleral icterus. No conjunctival pallor.  Cardiovascular: Normal rate.  Pulmonary/Chest: Respiratory effort normal.  Abdominal: Soft. There is no abdominal tenderness. There is no guarding and no rebound. Abdomen not distended. There is no CVA tenderness. No visible abdominal trauma.  Genitourinary:    Genitourinary Comments: Chaperone Caitlin   Rectal:  Fecal occult blood test negative. Musculoskeletal: Normal range of motion.   Neurological: She is alert and oriented to person, place, and time. Moves all extremities equally.  Skin: Skin is warm. Skin is dry.  Psychiatric: Her behavior is normal.     ED Course   Lab results:   CBC AND DIFFERENTIAL - Abnormal      Result Value   WBC 8.3     RBC 5.19     HGB 11.8     HCT 38.0     MCV 73.2 (*)    MCH 22.7 (*)    MCHC 31.1 (*)    Plt Ct 508 (*)    RDW SD 47.4 (*)    MPV 9.1 (*)    NRBC% 0.0     Absolute NRBC Count 0.00     NEUTROPHIL % 53.5     LYMPHOCYTE % 34.9     MONOCYTE % 8.4     Eosinophil % 2.5     BASOPHIL % 0.5     IG% 0.2     ABSOLUTE NEUTROPHIL COUNT 4.41     ABSOLUTE LYMPHOCYTE COUNT 2.88     Absolute Monocyte Count 0.69     Absolute Eosinophil Count 0.21     Absolute Basophil Count 0.04     Absolute Immature Granulocyte Count 0.02    COMPREHENSIVE METABOLIC PANEL - Abnormal   Na 134 (*)    Potassium 3.7     Cl 101     CO2 23     AGAP 10     Glucose 98     BUN 9     Creatinine 0.60     Ca 9.2     ALK PHOS 46     T Bili 0.4     Total Protein 7.5     Alb 3.9     GLOBULIN 3.6     ALBUMIN/GLOBULIN RATIO 1.1     BUN/CREAT RATIO 15.0     ALT 10     AST 10     eGFR 119     Comment: Normal GFR (glomerular filtration rate) > 60 mL/min/1.73 meters squared, < 60 may include impaired kidney function. Calculation based on the Chronic Kidney Disease Epidemiology Collaboration (CK-EPI)equation  refit without adjustment for race.  URINALYSIS W/MICRO REFLEX CULTURE - SYMPTOMATIC - Abnormal   Urine Color Yellow     Urine Clarity Cloudy (*)    Urine Specific Gravity 1.029     Urine pH 5.5     Urine Protein - Dipstick 10 (*)    Urine Glucose Negative     Urine Ketones 80 (*)    Urine Bilirubin Negative  Urine Blood Negative     Urine Urobilinogen <2     Urine Nitrite Negative     Urine Leukocyte Esterase Negative     Urine Squamous Epithelial Cells 17 (*)    Urine WBC 5 (*)    Urine RBC 1     Urine Mucous Many (*)    Urine Hyaline Casts 1     UA Microscopic Yes Micro (*)    Narrative:    Does not meet criteria for reflex to Urine Culture.  MAGNESIUM - Normal   Mg 1.9    POCT HCG - Normal   POC HCG Negative     Lot Number [id number]     Expiration Date [id number]     INTERNAL QC CHECK PERFORMED Acceptable    TYPE AND SCREEN    Imaging: No data to display   ECG: ECG Results   None                                                                        Pre-Sedation Procedures    Medical Decision Making Hematochezia x1. She has a normal exam. DRE hemoccult negative.   Hgb is wnl. Other testing wnl.   DDX: internal/ external hemorrhoid, diverticular bleeding, anemia, electrolyte abnormality  Barriers to care: none  I considered escalating care but decided to continue outpatient treatment as she is stable and her testing is normal.   Pt is switching to a new PCP and already has an appointment.   Amount and/or Complexity of Data Reviewed External Data Reviewed: notes.    Details: History obtained from conversation with the pt and family, most recent PCP, ED and hospitalization notes. Discussed with ED nursing staff  Epic notes reviewed  Care Everywhere notes reviewed  Labs: ordered. Decision-making details documented in ED Course.  Risk Prescription drug management.          Provider  Communication  Discharge Medication List as of 12/28/2023 10:54 AM     START taking these medications   Details  esomeprazole magnesium (NEXIUM) 40 mg capsule Take one capsule (40 mg dose) by mouth daily for 10 days., Starting Tue 12/28/2023, Until Fri 01/07/2024, Normal        Discharge Medication List as of 12/28/2023 10:54 AM      Discharge Medication List as of 12/28/2023 10:54 AM      Clinical Impression Final diagnoses:  Rectal bleeding  Gastroesophageal reflux disease, unspecified whether esophagitis present    ED Disposition     ED Disposition  Discharge   Condition  Stable   Comment  --                 Follow-up Information     [facility] Emergency Department.   Specialty: Emergency Medicine Comments: As needed Contact information: [address] [place]  [address] [phone] Additional information: When you arrive, our care team of nurses or providers will be ready to help you. Pre-registering, while not a scheduled appointment, helps us  prepare for your visit, ensuring a smoother and faster evaluation process.                 Electronically signed by:       [1] Past Medical History:  Diagnosis Date   Diabetes mellitus (*)    Hypertension    Sciatic nerve pain   [2] No past surgical history on file. [3] Social History Tobacco Use  Smoking Status Never  Smokeless Tobacco Never  [4] No Known Allergies  Franky Free, PA-C 12/28/23 1119

## 2024-01-21 ENCOUNTER — Other Ambulatory Visit (HOSPITAL_COMMUNITY): Payer: Self-pay

## 2024-02-04 NOTE — ED Provider Notes (Signed)
 " [facility]  ED Provider Note Medical screening exam initiated in triage. HPI reviewed. Vital signs reviewed. Flu like symptoms x 2 days  Patient stable and in no distress in triage. [place], PA-C 11:38 AM 02/04/2024    Lisa Cooley 39 y.o. female DOB: September 19, 1985 MRN: [id number] History   Chief Complaint  Patient presents with   Flu Like Symptoms    Patient reports congestion, headache, loss of taste/smell and aches x 2 days    History provided by:  Patient Language interpreter used: No   Cough Quality:  Productive Severity:  Moderate Onset quality:  Gradual Duration: Present for 2 days as reported. Timing:  Constant Progression:  Unchanged Chronicity:  New Smoker: yes   Relieved by:  Nothing Worsened by:  Nothing Associated symptoms: fever, headaches, rhinorrhea, sinus congestion and wheezing   Associated symptoms: no chest pain, no chills, no shortness of breath and no sore throat        Past Medical History:  Diagnosis Date   Diabetes mellitus (*)    Hypertension    Sciatic nerve pain     History reviewed. No pertinent surgical history.  Social History   Substance and Sexual Activity  Alcohol Use Not Currently   Tobacco Use History[1] E-Cigarettes   Vaping Use Never User    Start Date     Cartridges/Day     Quit Date     Social History   Substance and Sexual Activity  Drug Use Yes   Types: Marijuana   Comment: daily         Allergies[2]  Discharge Medication List as of 02/04/2024  1:06 PM     CONTINUE these medications which have NOT CHANGED   Details  amLODIPine  besylate (NORVASC ) 10 mg tablet Take one tablet (10 mg dose) by mouth daily., Starting Wed 08/05/2022, Normal    carvedilol (COREG) 12.5 mg tablet Take one tablet (12.5 mg dose) by mouth 2 (two) times daily with meals., Starting Wed 01/19/2024, Normal    clindamycin  (CLEOCIN  T) 1 % lotion Apply topically., Starting Fri 09/12/2021,  Historical Med    dicyclomine (BENTYL) 10 mg capsule Take ten mg by mouth 4 (four) times daily., Starting Wed 01/19/2024, Normal    DULoxetine  HCl (CYMBALTA ) 60 mg capsule Take one capsule (60 mg dose) by mouth., Starting Tue 07/29/2021, Historical Med    esomeprazole magnesium (NEXIUM) 40 mg capsule Take one capsule (40 mg dose) by mouth daily for 10 days., Starting Tue 12/28/2023, Until Tue 01/18/2024, Normal    ibuprofen  (ADVIL ,MOTRIN ) 800 mg tablet Take one tablet (800 mg dose) by mouth every 8 (eight) hours as needed for Pain., Starting Sat 05/02/2022, Normal    losartan  potassium (COZAAR ) 100 mg tablet Take one tablet (100 mg dose) by mouth daily., Historical Med    metFORMIN  ER (GLUCOPHAGE -XR) 500 mg 24 hr tablet Take one tablet (500 mg dose) by mouth 1 (one) time each day with breakfast., Starting Mon 08/04/2021, Historical Med    methocarbamol  (ROBAXIN ) 500 MG tablet Take one tablet (500 mg dose) by mouth 4 (four) times a day as needed., Starting Sat 05/02/2022, Normal    MOUNJARO 10 MG/0.5ML SOAJ injection Inject 0.5 mLs (10 mg dose) into the skin once a week., Starting Wed 12/08/2023, Historical Med    ondansetron  (ZOFRAN ) 4 mg tablet Take one tablet (4 mg dose) by mouth daily as needed for Nausea., Starting Wed 01/19/2024, Until Thu 01/18/2025 at 2359, Normal    pregabalin (LYRICA)  75 mg capsule Take one capsule (75 mg dose) by mouth 3 (three) times a day. Max Daily Amount: 225 mg, Starting Wed 01/19/2024, Normal    rosuvastatin  calcium  (CRESTOR ) 20 mg tablet Take one tablet (20 mg dose) by mouth at bedtime., Starting Thu 01/20/2024, Normal    spironolactone (ALDACTONE) 25 mg tablet Take one tablet (25 mg dose) by mouth daily., Historical Med    traMADol  (ULTRAM ) 50 mg tablet Take one tablet (50 mg dose) by mouth every 6 (six) hours as needed for Pain. Max Daily Amount: 200 mg, Historical Med        Primary Survey  Primary Survey  Review of Systems   Review of Systems   Constitutional:  Positive for fatigue and fever. Negative for activity change, appetite change and chills.  HENT:  Positive for congestion, rhinorrhea, sinus pressure and sinus pain. Negative for sore throat.   Eyes:  Negative for photophobia and visual disturbance.  Respiratory:  Positive for cough and wheezing. Negative for chest tightness and shortness of breath.   Cardiovascular:  Negative for chest pain.  Gastrointestinal:  Negative for abdominal pain, diarrhea, nausea and vomiting.  Genitourinary:  Negative for dysuria.  Musculoskeletal:  Negative for back pain and neck pain.  Neurological:  Positive for headaches. Negative for dizziness, syncope and weakness.  All other systems reviewed and are negative.   Physical Exam   ED Triage Vitals [02/04/24 1138]  BP (!) 158/90  Heart Rate 90  Resp 17  SpO2 99 %  Temp 99.4 F (37.4 C)    Physical Exam  Nursing note and vitals reviewed. Constitutional: She appears well-developed and well-nourished.  HENT:  Head: Normocephalic and atraumatic.  Right Ear: Normal external ear.  Left Ear: Normal external ear.  Nose: Nose normal.  Mouth/Throat: Posterior oropharyngeal erythema (Mild pharyngeal erythema no asymmetry or signs of tonsillar abscess). Voice normal.  Eyes: EOM are intact. Pupils are equal, round, and reactive to light.  Neck: Normal range of motion and voice normal.  Cardiovascular: Normal rate, regular rhythm and normal heart sounds.  Pulmonary/Chest: Respiratory effort normal and breath sounds normal.  Abdominal: Soft.  Musculoskeletal: Normal range of motion.     Cervical back: Normal range of motion.   Neurological: She is alert. Moves all extremities equally. She has normal speech.  Skin: Skin is warm. Skin is dry.  Psychiatric: She has a normal mood and affect. Her behavior is normal.     ED Course   Lab results:   COVID-19, FLU A+B AND RSV - Normal      Result Value   Flu A Negative     Flu B Negative      RSV PCR Negative     SARS-COV-2 Not Detected     Narrative:    SARS-COV-2 (COVID-19)PCR-Negative results do not preclude SARS-CoV-2 infection and should not be used as the sole basis for patient management decisions. Negative results must be combined with clinical observations, patient history, and epidemiological information.  Flu and/or RSV - Negative results do not preclude the presence of Flu or RSV virus and should not be used as the sole basis for treatment or other patient management decisions. False negative results may occur if virus is present at levels below the analytical limit of detection.  This test detects Influenza A, Influenza B, and Respiratory Syncytial Virus and SARS-COV-2 (COVID-19) by PCR.       Imaging:   XR CHEST PA AND LATERAL   Narrative:    CLINICAL  HISTORY: Cough  COMPARISON: 08/05/2022  TECHNIQUE: PA and lateral views of the chest. 02/04/2024 12:00 PM  FINDINGS: The cardiomediastinal silhouette is within normal limits. The lung fields are clear. No pneumothorax or effusion. Soft tissues including the upper abdomen appear normal.   Bones are intact.    Impression:    IMPRESSION: No acute cardiopulmonary abnormality.  Electronically Signed by: Grayce Linear, MD on 02/04/2024 12:21 PM     ECG: ECG Results   None                                                                        Pre-Sedation Procedures    Medical Decision Making 39 year old female reports cough congestion, sinus pressure, reports present for 2 days, patient also reports cigarette smoker, with productive cough, low-grade fevers, will check labs, chest x-ray await results  Differential diagnosis for this patient includes but not limited to cough, pneumonia, COVID virus infection, influenza, fever,  Chronic conditions affecting treatment would include diabetes, hypertension  Social determinants of significant impact treatment  include: None  1:02 PM Chest x-ray shows no acute cardiopulmonary finding, patient does not appear hypoxic on exam, I discussed signs of x-ray findings, treatment, return precautions, and follow-up with patient, patient verbalized understanding of discharge instructions, treatment, return precautions, and follow-up  Portions of this note were entered using voice recognition software.  Minor syntax, contextual, and spelling errors may be related to the use of the software and were not intentional.  If corrections are necessary, please contact provider.   Problems Addressed: Acute bacterial bronchitis: acute illness or injury Cough, unspecified type: acute illness or injury Wheezing: acute illness or injury  Amount and/or Complexity of Data Reviewed External Data Reviewed: notes.    Details: I have reviewed past labs, EKGs, radiology, as well as clinic notes to assist in care of this patient today  Labs: ordered.    Details: COVID influenza test are negative  Radiology: ordered and independent interpretation performed.    Details: Chest x-ray shows no acute cardiopulmonary findings  Risk Prescription drug management. Diagnosis or treatment significantly limited by social determinants of health.          Provider Communication  Discharge Medication List as of 02/04/2024  1:06 PM     START taking these medications   Details  albuterol  sulfate HFA (PROVENTIL ,VENTOLIN ,PROAIR ) 108 (90 Base) MCG/ACT inhaler Inhale 1 to 2 puffs every 4-6 hours as needed for cough or wheezing, Normal    cefdinir (OMNICEF) 300 mg capsule Take one capsule (300 mg dose) by mouth 2 (two) times daily for 10 days., Starting Fri 02/04/2024, Until Mon 02/14/2024, Normal    predniSONE  (DELTASONE ) 20 mg tablet Take 1 with breakfast and 1 with lunch for 4 days., Normal        Discharge Medication List as of 02/04/2024  1:06 PM      Discharge Medication List as of 02/04/2024  1:06 PM      Clinical  Impression Final diagnoses:  Acute bacterial bronchitis  Cough, unspecified type  Wheezing    ED Disposition     ED Disposition  Discharge   Condition  Stable   Comment  --  Follow-up Information     Maretta Garb, PA-C.   Specialties: Family Medicine, Physician Assistant Comments: Follow-up with your doctor in 2 to 3 days for recheck, recheck sooner if worsening Contact information: [address] [place] [address] [phone]                  Electronically signed by:       [1] Social History Tobacco Use  Smoking Status Never  Smokeless Tobacco Never  [2] No Known Allergies  Lisa Cheryl Gent, FNP 02/04/24 1445  "

## 2024-02-09 NOTE — Progress Notes (Addendum)
 Lisa Cooley is a 39 y.o.  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  Partner has two children, ages 1 and 73. Her infertility story is as follows:  Patient states that she and her partner  have been trying to conceive for approximately 2 years.  She has monthly very heavy and painful menses.  She has not used OPKs.   She has been diagmosed with an intramural fibroid -- US  01/06/24 (2.2 X 1.3X 1.9cm). At that time, she was given various treatment options including myomectomy or hysterectomy. However, patient still unsure if she is ready for definitive treatment given that she has no children. She has a history of diabetes and hypertension. Recent AMH, <1.0  LMP  02/09/2024, 01/12/24, 12/16/23  TSH 1.7 AMH 0.632 (01/06/24) BMI: 58 A1c: 7.6  Lisa Cooley has menses that are regular q 28-30 days, heavy lasting 7-10 days.  Her OB/GYN history is significant for the following: normal recent PAP smear, history positive for STD- trichomoniasis.  Prior procedures are significant for the following:  None  Current medications include:   Metformin  GLP-1 for 3 months Losartan   Lyrica PNV Propranolol  Surgical History[1] No surgeries to report  Medical History[2] HTN DM II  Family History[3]  Father deceased Mother- deceased -died when patient was 30 Sister-  DM family booth sides   reports that she has quit smoking. Her smoking use included cigarettes. She started smoking about 10 years ago. She has never been exposed to tobacco smoke. She has never used smokeless tobacco. She reports that she does not currently use alcohol. She reports that she does not currently use drugs after having used the following drugs: Marijuana.   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 Recent A1c -7.6 2)  History of posterior wall uterine fibroid.  Plan: 1)         I discussed with the patient the multitude of things needed in order to achieve  pregnancy, including sperm, ovulation, patent fallopian tubes and a normal uterine cavity.  Also discussed role of AMH in determining ovarian reserve, however, age still a better predictor of pregnancy than A1c. 2) After discussion, the patient and her partner would like to defer semen analysis at this time. 3) We will schedule an HSG with this cycle or next cycle. Needs doxycycline .   4) Recommend MFM consult given current BMI, A1c and HTN.  Discussed risks for pregnancy related to  DM, weight and HTN and will refer to MFM for additional counseling.  Goal for A1c <6.0.  5) Patient to try OPKs and will call with postive test to schedule initial labs and luteal P4 to confirm ovulation.  6)         Continue/start PNV with folic acid (400 mcg) 7) Patient did not complete NP packet.  Genetic Carrier screening reviewed.  Discuss with patient at initial labs  8) Once initial labs, HSG and MFM consult complete, will schedule phone visit to discuss whether patient would like to proceed with treatment.    Todays visit was completed via a real-time telehealth (see specific modality noted below). A telehealth visit was utilized in order to decrease the patient's potential exposure to COVID-19 vs an in-person visit. The patient/authorized person provided oral consent at the time of the visit to engaging in a telemedicine encounter with the present provider at [facility]. The patient/authorized person was  informed of the potential benefits, limitations, and risks of telemedicine. The patient/authorized person expressed understanding that the laws that protect confidentiality also apply to telemedicine. The patient/authorized person acknowledged understanding that telemedicine does not provide emergency services and that he or she would need to call 911 or proceed to the nearest hospital for help if such a need arose.  I have personally spent 55 minutes involved in face-to-face and non-face-to-face  activities for this patient on the day of the visit.  Professional time spent includes the following activities, in addition to those noted in the documentation: Record and chart review, documentation, messaging to staff. Telehealth Modality: Modality: Video visit   Lisa Batty, PhD, [facility]         [1] No past surgical history on file. [2] Past Medical History: Diagnosis Date   Arthritis    Asthma (CMD)    Diabetes mellitus (CMD)    GERD (gastroesophageal reflux disease)    Headache    Hypercholesterolemia    Hypertension   [3] No family history on file.

## 2024-02-10 NOTE — Progress Notes (Signed)
 " [address] [place] [address] [phone]  New Patient Visit  Referring Physician: Lorenz Daughters, PA-C Primary Care Provider: Daughters Lorenz, PA-C  Subjective  CC:     Patient presents with   Rectal Bleeding     HPI: Lisa Cooley is a 39 y.o. (DOB 16-Apr-1985) female with a history of morbid obesity, HTN, T2DM who presents for management of rectal bleeding.    Patient reports she had several episodes where she noted bright red blood in her stool and on toilet tissue over the course of the day about a month ago.  There were no clots within the stool.  She did not vomit any blood and there was no melena.  This has not happened since.  Notably, she states she moves her bowels about once every 3 days.  Sometimes they are hard and she has to strain some to get the stool out.  Other times she may have some looser stool.  She is not taking any laxatives.  She is taking Mounjaro which she started a few months ago.    Otherwise, she does report ongoing epigastric pain.  She also has heartburn and regurgitation symptoms that are reasonably well-controlled on Nexium.  The patient has never had an upper endoscopy or colonoscopy previously.  The patient does not take any blood thinners.  No surgically altered upper GI anatomy.  The patient does take ibuprofen  almost every day.  No family history of GI tract cancers.    Past Medical History:  Diagnosis Date   Diabetes mellitus (*)    Hypertension    Sciatic nerve pain    History reviewed. No pertinent surgical history. There are no active problems to display for this patient.   Medications: Medications Taking[1]  Allergies[2]  Social History[3] Family History[4]  Review of Systems:  Constitutional and Gastrointestinal systems were reviewed and are negative except as noted in HPI  Objective  BP 134/84 (BP Location: Left Upper Arm, Patient Position: Sitting)   Pulse 88   Temp 98.4 F (36.9 C)  (Oral)   Ht 5' 7.5 (1.715 m)   Wt (!) 371 lb 3.2 oz (168.4 kg)   LMP 38/11/2023   BMI 57.28 kg/m    General:  Alert and oriented, no acute distress HEENT: Anicteric sclera Respiratory:  Normal work of breathing on room air  CV:  Regular rhythm Abd:  Soft, non-tender, non-distended Ext:  Warm and well perfused, no lower extremity edema Skin: No jaundice Psychiatric: Judgment and insight seem normal.       Data Reviewed  Labs:  I have personally reviewed the labs shown below  Lab Results  Component Value Date   WBC 8.3 12/28/2023   HGB 11.8 12/28/2023   HCT 38.0 12/28/2023   MCV 73.2 (L) 12/28/2023   Plt Ct 508 (H) 12/28/2023      Chemistry      Component Value Date/Time   NA 134 (L) 12/28/2023 1000   K 3.7 12/28/2023 1000   CL 101 12/28/2023 1000   CO2 23 12/28/2023 1000   BUN 9 12/28/2023 1000   CREATININE 0.60 12/28/2023 1000   GLUCOSE 98 12/28/2023 1000   GLUCOSE 270 (A) 05/02/2022 1228      Component Value Date/Time   CALCIUM  9.2 12/28/2023 1000   ALKPHOS 46 12/28/2023 1000   AST 10 12/28/2023 1000   ALT 10 12/28/2023 1000   BILITOT 0.4 12/28/2023 1000   BILITOT <0.15 08/05/2022 1726  Lab Results  Component Value Date   ALT 10 12/28/2023   AST 10 12/28/2023   ALK PHOS 46 12/28/2023   T Bili 0.4 12/28/2023     Lab Results  Component Value Date   Iron 24 (L) 01/18/2024   Iron Saturation 7 (L) 01/18/2024   TIBC 324 01/18/2024   Ferritin 28 01/18/2024     No results found for: INR   No results found for: HAV, HEPCAB    Imaging:  I have personally reviewed the below imaging studies  CT A/P 11/18/23 No acute findings in the abdomen and pelvis. Mild colonic reticulosis without evidence of diverticulitis. Unchanged low-attenuation region with adjacent cortical scarring in the superior left kidney. Hepatic steatosis.  Endoscopic Procedures:  No prior endoscopic procedure  Assessment and Plan   1. Rectal bleeding   2.  Abdominal pain, unspecified abdominal location   3. Constipation, unspecified constipation type     The patient presents for evaluation of new onset rectal bleeding in addition to what sounds like constipation.  I do suspect this is likely hemorrhoid related.  However, given the new onset of bleeding and the rising incidence of colon cancer in young people, I recommended a colonoscopy to her.  I explained this reasoning to her.  However, she declined the procedure at this time.  I recommended that she start MiraLAX for constipation in the meantime.  If her symptoms recur, she will let me know.  Regarding her upper abdominal pain, she may have a peptic ulcer given her NSAID use.  Could also be GERD related.  However, she has not gotten any relief from Nexium.  Given her NSAID use and lack of response to a PPI, I again recommended an EGD.  However, she would like to try another medication prior to doing this.  I have switched her to omeprazole  40 mg twice daily.  I have encouraged her to  stop taking NSAID medications   No orders of the defined types were placed in this encounter.   Patient's Medications  New Prescriptions   OMEPRAZOLE  (PRILOSEC) 40 MG CAPSULE    Take one capsule (40 mg dose) by mouth 2 (two) times daily.   POLYETHYLENE GLYCOL (MIRALAX,GAVILAX,CLEARLAX) 17 GM/SCOOP POWDER    Take 120 mLs (17 g dose) by mouth daily.  Discontinued Medications   ESOMEPRAZOLE MAGNESIUM (NEXIUM) 40 MG CAPSULE    Take one capsule (40 mg dose) by mouth daily for 10 days.   IBUPROFEN  (ADVIL ,MOTRIN ) 800 MG TABLET    Take one tablet (800 mg dose) by mouth every 8 (eight) hours as needed for Pain.     RTC No follow-ups on file.   Patient Instructions  Please stop taking the esomeprazole (also called Nexium).  You should start taking omeprazole  (also called Prilosec) twice per day.  You should take this about 30 minutes prior to breakfast and about 30 minutes prior to dinner.  You should also start  taking MiraLAX once per day.  If you notice that you are not moving her bowels better, then you should take it twice per day.  If you notice recurrent bleeding, please let me know.  It was a pleasure meeting you today. Please feel free to call my office with any questions or concerns.         Electronically signed: Mabel JINNY Endo, MD 02/11/2024 / 10:43 AM       [1] Outpatient Medications Marked as Taking for the 02/11/24 encounter (Initial consult) with Nicholas J Koutlas,  MD  Medication Sig Dispense Refill   albuterol  sulfate HFA (PROVENTIL ,VENTOLIN ,PROAIR ) 108 (90 Base) MCG/ACT inhaler Inhale 1 to 2 puffs every 4-6 hours as needed for cough or wheezing 18 g 0   amLODIPine  besylate (NORVASC ) 10 mg tablet Take one tablet (10 mg dose) by mouth daily. 30 tablet 0   carvedilol (COREG) 12.5 mg tablet Take one tablet (12.5 mg dose) by mouth 2 (two) times daily with meals. 60 tablet 0   cefdinir (OMNICEF) 300 mg capsule Take one capsule (300 mg dose) by mouth 2 (two) times daily for 10 days. 20 capsule 0   clindamycin  (CLEOCIN  T) 1 % lotion Apply topically.     dicyclomine (BENTYL) 10 mg capsule Take ten mg by mouth 4 (four) times daily. 56 capsule 0   DULoxetine  HCl (CYMBALTA ) 60 mg capsule Take one capsule (60 mg dose) by mouth.     [DISCONTINUED] esomeprazole magnesium (NEXIUM) 40 mg capsule Take one capsule (40 mg dose) by mouth daily for 10 days. 10 capsule 0   [DISCONTINUED] ibuprofen  (ADVIL ,MOTRIN ) 800 mg tablet Take one tablet (800 mg dose) by mouth every 8 (eight) hours as needed for Pain. 90 tablet 0   losartan  potassium (COZAAR ) 100 mg tablet Take one tablet (100 mg dose) by mouth daily.     metFORMIN  ER (GLUCOPHAGE -XR) 500 mg 24 hr tablet Take one tablet (500 mg dose) by mouth 1 (one) time each day with breakfast.     methocarbamol  (ROBAXIN ) 500 MG tablet Take one tablet (500 mg dose) by mouth 4 (four) times a day as needed. 30 tablet 0   MOUNJARO 10 MG/0.5ML SOAJ  injection Inject 0.5 mLs (10 mg dose) into the skin once a week. 4 mL 0   ondansetron  (ZOFRAN ) 4 mg tablet Take one tablet (4 mg dose) by mouth daily as needed for Nausea. 30 tablet 1   predniSONE  (DELTASONE ) 20 mg tablet Take 1 with breakfast and 1 with lunch for 4 days. 8 tablet 0   pregabalin (LYRICA) 75 mg capsule Take one capsule (75 mg dose) by mouth 3 (three) times a day. Max Daily Amount: 225 mg 90 capsule 0   rosuvastatin  calcium  (CRESTOR ) 20 mg tablet Take one tablet (20 mg dose) by mouth at bedtime. 90 tablet 0   spironolactone (ALDACTONE) 25 mg tablet Take one tablet (25 mg dose) by mouth daily.     traMADol  (ULTRAM ) 50 mg tablet Take one tablet (50 mg dose) by mouth every 6 (six) hours as needed for Pain for up to 30 days. Max Daily Amount: 200 mg 30 tablet 0  [2] No Known Allergies [3] Social History Socioeconomic History   Marital status: Single  Tobacco Use   Smoking status: Never    Passive exposure: Never   Smokeless tobacco: Never  Vaping Use   Vaping status: Never Used  Substance and Sexual Activity   Alcohol use: Not Currently   Drug use: Yes    Types: Marijuana    Comment: daily  [4] Family History Problem Relation Name Age of Onset   Cancer Father     Colon cancer Neg Hx     Colon polyps Neg Hx    "

## 2024-02-11 NOTE — Progress Notes (Signed)
AVS printed and given to patient

## 2024-02-15 ENCOUNTER — Ambulatory Visit: Admission: EM | Admit: 2024-02-15 | Discharge: 2024-02-15 | Disposition: A | Discharge status: Home / Self Care

## 2024-02-15 DIAGNOSIS — J309 Allergic rhinitis, unspecified: Secondary | ICD-10-CM

## 2024-02-15 DIAGNOSIS — I10 Essential (primary) hypertension: Secondary | ICD-10-CM

## 2024-02-15 DIAGNOSIS — R519 Headache, unspecified: Secondary | ICD-10-CM | POA: Diagnosis not present

## 2024-02-15 MED ORDER — HYDROCHLOROTHIAZIDE 25 MG PO TABS: 25.0000 mg | ORAL_TABLET | Freq: Every morning | ORAL | 2 refills | Status: AC

## 2024-02-15 MED ORDER — FLUTICASONE PROPIONATE 50 MCG/ACT NA SUSP: 1.0000 | Freq: Every day | NASAL | 2 refills | Status: AC

## 2024-02-15 MED ORDER — CETIRIZINE HCL 10 MG PO TABS: 10.0000 mg | ORAL_TABLET | Freq: Every day | ORAL | 2 refills | Status: AC

## 2024-02-15 MED ORDER — AMLODIPINE BESYLATE 5 MG PO TABS: 5.0000 mg | ORAL_TABLET | Freq: Every day | ORAL | 2 refills | Status: AC

## 2024-02-15 NOTE — ED Provider Notes (Signed)
 [facility]    CSN: [id number] Arrival date & time: 02/15/24  1451    HISTORY   Chief Complaint  Patient presents with   Headache   HPI Lisa Cooley is a pleasant, 39 y.o. female who presents to urgent care today. Patient states she has been having intermittent headaches. She has had this current headache for four days. She has tried Tylenol  without relief. She states when she is in the light her vision becomes blurry and she has eye pain.  Patient was seen by her primary care provider 3 hours ago, states she did mention the headache at the time, states her provider wants her to have a sleep study.  When asked, patient states she is unsure what blood pressure medications she is taking but insist that she is taking for.  EMR reviewed by me, there is no med list documented in her visit with her PCP today; med list from a GI consult from 6 days ago is inconsistent with medication list in patient's chart at this time.  I contacted [facility] during patient's visit today.  The only blood pressure medication they have been filling for her is carvedilol.  Patient states she has also been filling her medications at the [facility] which [facility] a [facility], that list contains losartan  and spironolactone.  Patient states she has been taking losartan  for a long time but the spironolactone was prescribed by an ED provider several months ago but did not have any refills with it.  Patient states her primary care provider has referred her for a sleep study and she also recently received prescription eyeglasses to see things far away but adds that she has not been wearing them because it makes her feel weird.  The history is provided by the patient.  Headache  Past Medical History:  Diagnosis Date   Headache(784.0)    Healthcare maintenance 08/18/2018   Hypertension    Lumbar herniated disc    Myocardial ischemia    Sciatica    Screening  for malignant neoplasm of cervix 12/20/2017   Patient Active Problem List   Diagnosis Date Noted   Dysuria 11/09/2023   Hyperlipidemia 11/09/2023   Decreased range of motion of lumbar spine 07/28/2023   Impaired functional mobility and activity tolerance 07/28/2023   Chest pain 12/28/2022   Myopia with astigmatism, bilateral 11/20/2022   Myocardial ischemia 07/07/2022   Morbid obesity with body mass index (BMI) of 50.0 to 59.9 in adult (HCC) 05/14/2022   Routine screening for STI (sexually transmitted infection) 12/23/2021   Hidradenitis suppurativa 09/25/2021   Skin lesion 09/11/2021   Severe dysmenorrhea 04/10/2021   Left wrist pain 04/10/2021   Protrusion of lumbar intervertebral disc 01/30/2021   Hypertension associated with diabetes (HCC) 08/18/2018   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   DM2 (diabetes mellitus, type 2) (HCC) 08/12/2011   Morbid obesity (HCC) 08/12/2011   Infertility, female 08/12/2011   Irregular periods 08/12/2011   Past Surgical History:  Procedure Laterality Date   NO PAST SURGERIES     OB History     Gravida  1   Para  0   Term  0   Preterm      AB  0   Living  0      SAB  0   IAB      Ectopic      Multiple  Live Births             Home Medications    Prior to Admission medications  Medication Sig Start Date End Date Taking? Authorizing Provider  adalimumab (HUMIRA) 80 MG/0.8ML pen Inject 160mg  (contents of 2 pens) into the skin on day 1 and inject 80mg  (contents of 1 pen) into the skin on day 15. Then start maintenance dose 2 weeks later 12/29/22   [provider]  adalimumab (HUMIRA, 2 PEN,) 40 MG/0.4ML pen Inject 40 mg into the skin. 12/29/22   [provider]  benzonatate  (TESSALON ) 200 MG capsule Take 1 capsule (200 mg total) by mouth 3 (three) times daily as needed for cough. 05/27/23   Rodriguez-Southworth, Kyra, PA-C  blood glucose meter kit and supplies KIT Dispense based on patient  and insurance preference. Use up to four times daily as directed. (FOR ICD-9 250.00, 250.01). 09/01/19   Hope Merle, MD  Blood Glucose Monitoring Suppl (ONETOUCH VERIO) w/Device KIT Please use to check blood sugar up to once daily. E11.9 09/10/21   Bryan Bianchi, MD  clindamycin  (CLEOCIN  T) 1 % lotion Apply topically 2 (two) times daily. Apply to underarms in affected areas. 09/12/21   Macario Dorothyann HERO, MD  Continuous Glucose Sensor (DEXCOM G7 SENSOR) MISC 1 Application by Other route. 04/16/22   [provider]  diclofenac  Sodium (VOLTAREN ) 1 % GEL Apply 4 g topically 4 (four) times daily. 05/05/21   Hope Merle, MD  DULoxetine  (CYMBALTA ) 60 MG capsule Take 1 capsule (60 mg total) by mouth daily. 07/29/21   Hope Merle, MD  famotidine  (PEPCID ) 20 MG tablet Take 20 mg by mouth. 02/08/23   [provider]  fexofenadine  (ALLEGRA  ALLERGY) 180 MG tablet Take 1 tablet (180 mg total) by mouth daily. 05/27/23   Rodriguez-Southworth, Sylvia, PA-C  fluticasone  (FLONASE ) 50 MCG/ACT nasal spray Place 2 sprays into both nostrils daily. 05/27/23   Rodriguez-Southworth, Sylvia, PA-C  gabapentin  (NEURONTIN ) 300 MG capsule Take 3 capsules (900 mg total) by mouth 2 (two) times daily. 01/15/21   Austin Ade, MD  glipiZIDE  (GLUCOTROL ) 5 MG tablet Take 1 tablet (5 mg total) by mouth daily before breakfast. 10/25/20   Hope Merle, MD  glucose blood (ONETOUCH VERIO) test strip Please use to check blood sugar up to once daily. E11.9 09/10/21   Bryan Bianchi, MD  glucose blood test strip Use as instructed 09/19/19   Scarlet Elsie LABOR, MD  ibuprofen  (ADVIL ) 600 MG tablet Take 1 tablet (600 mg total) by mouth every 8 (eight) hours as needed for up to 30 doses for fever, headache, mild pain (pain score 1-3) or moderate pain (pain score 4-6) (Inflammation). Take 1 tablet 3 times daily as needed for inflammation of upper airways and/or pain. 11/24/22   Joesph Shaver Scales, PA-C  Lancet Devices [facility] DELICA PLUS  LANCING) MISC Please use to check blood sugar up to once daily. E11.9 09/10/21   Bryan Bianchi, MD  Lancet Devices MISC 1 Container by Does not apply route 2 (two) times daily as needed. As directed. 09/19/19   Scarlet Elsie LABOR, MD  Lancets [facility] DELICA PLUS [place]) MISC Please use to check blood sugar up to once daily. E11.9 09/10/21   Bryan Bianchi, MD  lidocaine  (XYLOCAINE ) 5 % ointment Apply 1 Application topically 4 (four) times daily as needed. 01/08/23   Joesph Shaver Scales, PA-C  losartan  (COZAAR ) 100 MG tablet Take 1 tablet by mouth daily. 12/28/22 12/28/23  [provider]  metFORMIN  (GLUCOPHAGE -XR) 500  MG 24 hr tablet Take 1 tablet (500 mg total) by mouth daily with breakfast. 08/04/21   Dameron, Marisa, DO  methocarbamol  (ROBAXIN ) 500 MG tablet Take 1 tablet (500 mg total) by mouth every 8 (eight) hours as needed for muscle spasms. 02/18/22   Dameron, Marisa, DO  pantoprazole (PROTONIX) 40 MG tablet Take 40 mg by mouth. 10/16/22 10/16/23  [provider]  propranolol (INDERAL) 20 MG tablet Take 1 tablet by mouth daily. 10/16/22   [provider]  rosuvastatin  (CRESTOR ) 20 MG tablet Take 1 tablet (20 mg total) by mouth daily. 12/23/21   Dameron, Marisa, DO  Semaglutide ,0.25 or 0.5MG /DOS, 2 MG/3ML SOPN Inject 0.5 mg into the skin once a week. 05/27/23   Rodriguez-Southworth, Sylvia, PA-C  spironolactone (ALDACTONE) 25 MG tablet Take 1 tablet by mouth daily. 04/02/23 06/11/23  [provider]  traMADol  (ULTRAM ) 50 MG tablet Take 1 tablet (50 mg total) by mouth every 12 (twelve) hours as needed. 03/13/22   Dameron, Marisa, DO  albuterol  (VENTOLIN  HFA) 108 (90 Base) MCG/ACT inhaler Inhale 2 puffs into the lungs every 6 (six) hours as needed for wheezing or shortness of breath. 08/30/19 09/13/19  Hope Merle, MD    Family History Family History  Problem Relation Age of Onset   Cancer Father    Heart disease Sister    Other Neg Hx    Social History Social  History[1] Allergies   Ibuprofen   Review of Systems Review of Systems  Neurological:  Positive for headaches.   Pertinent findings revealed after performing a 14 point review of systems has been noted in the history of present illness.  Physical Exam Vital Signs BP (!) 160/103 (BP Location: Right Arm) Comment (BP Location): right upper arm  Pulse 99   Temp 98 F (36.7 C) (Oral)   Resp 18   LMP 02/09/2024   SpO2 94%   No data found.  Physical Exam Vitals and nursing note reviewed.  Constitutional:      General: She is awake. She is not in acute distress.    Appearance: Normal appearance. She is well-developed and well-groomed. She is not ill-appearing.  HENT:     Head: Normocephalic and atraumatic.     Right Ear: Hearing, ear canal and external ear normal. Tympanic membrane is bulging.     Left Ear: Hearing, ear canal and external ear normal. Tympanic membrane is bulging.     Ears:     Comments: Bilateral TMs bulging with clear fluid.     Nose: No rhinorrhea.     Right Turbinates: Enlarged, swollen and pale.     Left Turbinates: Enlarged, swollen and pale.     Mouth/Throat:     Lips: Pink.     Mouth: Mucous membranes are moist.     Pharynx: Oropharynx is clear. Uvula midline.  Eyes:     Extraocular Movements: Extraocular movements intact.     Pupils: Pupils are equal, round, and reactive to light.  Cardiovascular:     Rate and Rhythm: Normal rate and regular rhythm.     Heart sounds: Normal heart sounds.  Pulmonary:     Effort: Pulmonary effort is normal.     Breath sounds: Normal breath sounds.  Musculoskeletal:     Cervical back: Normal range of motion and neck supple.  Neurological:     Mental Status: She is alert and oriented to person, place, and time. Mental status is at baseline.  Psychiatric:        Behavior: Behavior  is cooperative.     Visual Acuity Right Eye Distance:   Left Eye Distance:   Bilateral Distance:    Right Eye Near:   Left Eye  Near:    Bilateral Near:     UC Couse / Diagnostics / Procedures:     Radiology No results found.  Procedures Procedures (including critical care time) EKG  Pending results:  Labs Reviewed - No data to display  Medications Ordered in UC: Medications - No data to display  UC Diagnoses / Final Clinical Impressions(s)   I have reviewed the triage vital signs and the nursing notes.  Pertinent labs & imaging results that were available during my care of the patient were reviewed by me and considered in my medical decision making (see chart for details).    Final diagnoses:  Allergic rhinitis, unspecified seasonality, unspecified trigger  Acute nonintractable headache, unspecified headache type  Essential hypertension   Patient provided with prescriptions for Zyrtec  and Flonase  for relief of symptoms of allergic rhinitis.  Patient advised after gathering data regarding her current and past antihypertensive medications and in consideration of her currently elevated blood pressure, I recommend that she continue taking carvedilol twice daily and losartan  once daily, preferably at bedtime.  I have added amlodipine  and hydrochlorothiazide  to her current regimen.  I have encouraged her to take all 4 medications for at least the next week or until she is seen in follow-up by her primary care provider at which time I would like for her to specifically discuss current plan for blood pressure management.  Patient was encouraged to follow through with having a sleep study and to wear her pr prescription escription eyeglasses on a regular basis.  Please see discharge instructions below for details of plan of care as provided to patient. ED Prescriptions     Medication Sig Dispense Auth. Provider   fluticasone  (FLONASE ) 50 MCG/ACT nasal spray Place 1 spray into both nostrils daily. Begin by using 2 sprays in each nare daily for 3 to 5 days, then decrease to 1 spray in each nare daily. 15.8 mL  Joesph Shaver Scales, PA-C   cetirizine  (ZYRTEC  ALLERGY) 10 MG tablet Take 1 tablet (10 mg total) by mouth at bedtime. 30 tablet Joesph Shaver Scales, PA-C   amLODipine  (NORVASC ) 5 MG tablet Take 1 tablet (5 mg total) by mouth daily. 30 tablet Joesph Shaver Scales, PA-C   hydrochlorothiazide  (HYDRODIURIL ) 25 MG tablet Take 1 tablet (25 mg total) by mouth in the morning. 30 tablet Joesph Shaver Scales, PA-C      I have reviewed the PDMP during this encounter.    Discharge Instructions      For your blood pressure, you should be taking carvedilol 12.5 mg twice daily with meals, losartan  100 mg once daily preferably at bedtime.  I have added 2 medications, amlodipine  5 mg once daily in the morning and hydrochlorothiazide  25 mg once daily in the morning.  I recommend that you take the blood pressure medications for 1 week then follow-up with your primary care provider to recheck your blood pressure and discuss this regimen to see if any changes need to be made.  For allergies, I provided you with a prescription for Flonase  nasal spray and Zyrtec  tablets, both of which I would like you to use daily.  Thank you for visiting [facility] today.  We appreciate the opportunity to participate in your care.      Disposition Upon Discharge:  Condition: stable for discharge  home  Patient presented with an acute illness with associated systemic symptoms and significant discomfort requiring urgent management. In my opinion, this is a condition that a prudent lay person (someone who possesses an average knowledge of health and medicine) may potentially expect to result in complications if not addressed urgently such as respiratory distress, impairment of bodily function or dysfunction of bodily organs.   Routine symptom specific, illness specific and/or disease specific instructions were discussed with the patient and/or caregiver at length.   As such, the patient has been evaluated  and assessed, work-up was performed and treatment was provided in alignment with urgent care protocols and evidence based medicine.  Patient/parent/caregiver has been advised that the patient may require follow up for further testing and treatment if the symptoms continue in spite of treatment, as clinically indicated and appropriate.  Patient/parent/caregiver has been advised to return to the [facility] or PCP if no better; to PCP or the Emergency Department if new signs and symptoms develop, or if the current signs or symptoms continue to change or worsen for further workup, evaluation and treatment as clinically indicated and appropriate  The patient will follow up with their current PCP if and as advised. If the patient does not currently have a PCP we will assist them in obtaining one.   The patient may need specialty follow up if the symptoms continue, in spite of conservative treatment and management, for further workup, evaluation, consultation and treatment as clinically indicated and appropriate.  Patient/parent/caregiver verbalized understanding and agreement of plan as discussed.  All questions were addressed during visit.  Please see discharge instructions below for further details of plan.  This office note has been dictated using Teaching laboratory technician.  Unfortunately, this method of dictation can sometimes lead to typographical or grammatical errors.  I apologize for your inconvenience in advance if this occurs.  Please do not hesitate to reach out to me if clarification is needed.        [1]  Social History Tobacco Use   Smoking status: Former    Current packs/day: 0.00    Average packs/day: 0.3 packs/day    Types: Cigarettes    Quit date: 04/20/2014    Years since quitting: 9.8   Smokeless tobacco: Never  Vaping Use   Vaping status: Never Used  Substance Use Topics   Alcohol use: No    Alcohol/week: 0.0 standard drinks of alcohol   Drug use: Not Currently     Types: Marijuana     Joesph Shaver Scales, PA-C 02/15/24 1733  "

## 2024-02-15 NOTE — Discharge Instructions (Signed)
 For your blood pressure, you should be taking carvedilol 12.5 mg twice daily with meals, losartan  100 mg once daily preferably at bedtime.  I have added 2 medications, amlodipine  5 mg once daily in the morning and hydrochlorothiazide  25 mg once daily in the morning.  I recommend that you take the blood pressure medications for 1 week then follow-up with your primary care provider to recheck your blood pressure and discuss this regimen to see if any changes need to be made.  For allergies, I provided you with a prescription for Flonase  nasal spray and Zyrtec  tablets, both of which I would like you to use daily.  Thank you for visiting [facility] today.  We appreciate the opportunity to participate in your care.

## 2024-02-15 NOTE — Telephone Encounter (Signed)
 Dear Lisa Cooley  We received a referral from your doctor regarding your need for care.  We recently tried to contact you to schedule an appointment without success.    Please call us  at [phone] so we can schedule your appointment.    We are honored to be part of your healthcare team, and we look forward to meeting you!    Sincerely,   Your healthcare partners at   [facility]

## 2024-02-15 NOTE — ED Triage Notes (Signed)
 Patient states she has been having intermittent headaches. She has had this current headache for four days. She has tried Tylenol  without relief. She states when she is in the light her vision becomes blurry and she has eye pain.

## 2024-02-15 NOTE — Progress Notes (Signed)
 CHW attempted to reach the patient to speak about CHW program/referral. CHW unable to leave vm.  CHW will contact pt via MyChart and also continue to try to reach the pt.
